# Patient Record
Sex: Female | Born: 1935 | Race: White | Hispanic: No | Smoking: Never smoker
Health system: Southern US, Community
[De-identification: ages and names within clinical notes are randomized; demographics above are authoritative.]

## PROBLEM LIST (undated history)

## (undated) DIAGNOSIS — I10 Essential (primary) hypertension: Secondary | ICD-10-CM

## (undated) DIAGNOSIS — K219 Gastro-esophageal reflux disease without esophagitis: Secondary | ICD-10-CM

## (undated) DIAGNOSIS — M810 Age-related osteoporosis without current pathological fracture: Secondary | ICD-10-CM

## (undated) DIAGNOSIS — R7302 Impaired glucose tolerance (oral): Secondary | ICD-10-CM

## (undated) DIAGNOSIS — E785 Hyperlipidemia, unspecified: Secondary | ICD-10-CM

## (undated) DIAGNOSIS — E559 Vitamin D deficiency, unspecified: Secondary | ICD-10-CM

## (undated) HISTORY — DX: Impaired glucose tolerance (oral): R73.02

## (undated) HISTORY — DX: Hyperlipidemia, unspecified: E78.5

## (undated) HISTORY — PX: CATARACT EXTRACTION: SUR2

## (undated) HISTORY — DX: Age-related osteoporosis without current pathological fracture: M81.0

## (undated) HISTORY — DX: Essential (primary) hypertension: I10

## (undated) HISTORY — DX: Gastro-esophageal reflux disease without esophagitis: K21.9

## (undated) HISTORY — DX: Vitamin D deficiency, unspecified: E55.9

---

## 2003-04-04 ENCOUNTER — Emergency Department (HOSPITAL_COMMUNITY): Admission: EM | Admit: 2003-04-04 | Discharge: 2003-04-05 | Payer: Self-pay | Admitting: Emergency Medicine

## 2005-03-09 ENCOUNTER — Ambulatory Visit: Payer: Self-pay | Admitting: Internal Medicine

## 2005-05-17 ENCOUNTER — Ambulatory Visit: Payer: Self-pay | Admitting: Internal Medicine

## 2005-11-09 ENCOUNTER — Ambulatory Visit: Payer: Self-pay | Admitting: Internal Medicine

## 2006-05-15 ENCOUNTER — Ambulatory Visit: Payer: Self-pay | Admitting: Internal Medicine

## 2006-05-15 LAB — CONVERTED CEMR LAB
AST: 23 units/L (ref 0–37)
Albumin: 4.1 g/dL (ref 3.5–5.2)
Basophils Absolute: 0 10*3/uL (ref 0.0–0.1)
CO2: 34 meq/L — ABNORMAL HIGH (ref 19–32)
Chol/HDL Ratio, serum: 2.7
Creatinine, Ser: 0.8 mg/dL (ref 0.4–1.2)
Crystals: NEGATIVE
GFR calc non Af Amer: 74 mL/min
Glucose, Bld: 101 mg/dL — ABNORMAL HIGH (ref 70–99)
Ketones, ur: NEGATIVE mg/dL
MCHC: 33.5 g/dL (ref 30.0–36.0)
Monocytes Relative: 8.4 % (ref 3.0–11.0)
Neutro Abs: 5.7 10*3/uL (ref 1.4–7.7)
Platelets: 253 10*3/uL (ref 150–400)
RBC: 4.1 M/uL (ref 3.87–5.11)
RDW: 11.4 % — ABNORMAL LOW (ref 11.5–14.6)
Sodium: 138 meq/L (ref 135–145)
Specific Gravity, Urine: 1.01 (ref 1.000–1.03)
Total Bilirubin: 1.2 mg/dL (ref 0.3–1.2)
Urine Glucose: NEGATIVE mg/dL
Urobilinogen, UA: 0.2 (ref 0.0–1.0)
pH: 7 (ref 5.0–8.0)

## 2006-05-28 ENCOUNTER — Emergency Department (HOSPITAL_COMMUNITY): Admission: EM | Admit: 2006-05-28 | Discharge: 2006-05-28 | Payer: Self-pay | Admitting: Emergency Medicine

## 2007-04-10 DIAGNOSIS — M810 Age-related osteoporosis without current pathological fracture: Secondary | ICD-10-CM

## 2007-04-10 DIAGNOSIS — K219 Gastro-esophageal reflux disease without esophagitis: Secondary | ICD-10-CM

## 2007-04-10 DIAGNOSIS — E739 Lactose intolerance, unspecified: Secondary | ICD-10-CM

## 2007-04-10 DIAGNOSIS — I10 Essential (primary) hypertension: Secondary | ICD-10-CM

## 2007-04-10 DIAGNOSIS — E7800 Pure hypercholesterolemia, unspecified: Secondary | ICD-10-CM | POA: Insufficient documentation

## 2007-04-10 HISTORY — DX: Gastro-esophageal reflux disease without esophagitis: K21.9

## 2007-04-10 HISTORY — DX: Age-related osteoporosis without current pathological fracture: M81.0

## 2007-04-10 HISTORY — DX: Essential (primary) hypertension: I10

## 2007-04-13 ENCOUNTER — Encounter: Payer: Self-pay | Admitting: Internal Medicine

## 2007-04-13 DIAGNOSIS — E785 Hyperlipidemia, unspecified: Secondary | ICD-10-CM

## 2007-04-13 HISTORY — DX: Hyperlipidemia, unspecified: E78.5

## 2007-05-18 ENCOUNTER — Ambulatory Visit: Payer: Self-pay | Admitting: Internal Medicine

## 2007-05-18 LAB — CONVERTED CEMR LAB
ALT: 23 units/L (ref 0–35)
AST: 28 units/L (ref 0–37)
Basophils Absolute: 0.1 10*3/uL (ref 0.0–0.1)
Bilirubin Urine: NEGATIVE
Bilirubin, Direct: 0.2 mg/dL (ref 0.0–0.3)
CO2: 31 meq/L (ref 19–32)
Calcium: 9.8 mg/dL (ref 8.4–10.5)
Chloride: 101 meq/L (ref 96–112)
Cholesterol: 132 mg/dL (ref 0–200)
Creatinine, Ser: 0.8 mg/dL (ref 0.4–1.2)
Eosinophils Relative: 0.8 % (ref 0.0–5.0)
Glucose, Bld: 100 mg/dL — ABNORMAL HIGH (ref 70–99)
HCT: 36.8 % (ref 36.0–46.0)
Hemoglobin: 12.9 g/dL (ref 12.0–15.0)
MCHC: 35.1 g/dL (ref 30.0–36.0)
MCV: 90.9 fL (ref 78.0–100.0)
Monocytes Absolute: 0.7 10*3/uL (ref 0.2–0.7)
Mucus, UA: NEGATIVE
Neutrophils Relative %: 61.7 % (ref 43.0–77.0)
Nitrite: POSITIVE — AB
RDW: 11.6 % (ref 11.5–14.6)
Sodium: 139 meq/L (ref 135–145)
TSH: 3.64 microintl units/mL (ref 0.35–5.50)
Total Bilirubin: 1.3 mg/dL — ABNORMAL HIGH (ref 0.3–1.2)
Total Protein, Urine: NEGATIVE mg/dL
Total Protein: 7.2 g/dL (ref 6.0–8.3)
WBC: 7.4 10*3/uL (ref 4.5–10.5)
pH: 6.5 (ref 5.0–8.0)

## 2008-06-20 ENCOUNTER — Ambulatory Visit: Payer: Self-pay | Admitting: Internal Medicine

## 2009-07-15 ENCOUNTER — Telehealth: Payer: Self-pay | Admitting: Internal Medicine

## 2009-08-26 ENCOUNTER — Ambulatory Visit: Payer: Self-pay | Admitting: Internal Medicine

## 2009-08-26 DIAGNOSIS — E559 Vitamin D deficiency, unspecified: Secondary | ICD-10-CM | POA: Insufficient documentation

## 2009-08-26 HISTORY — DX: Vitamin D deficiency, unspecified: E55.9

## 2010-02-24 ENCOUNTER — Ambulatory Visit: Payer: Self-pay | Admitting: Internal Medicine

## 2010-08-08 LAB — CONVERTED CEMR LAB
ALT: 21 units/L (ref 0–35)
AST: 25 units/L (ref 0–37)
Albumin: 4.3 g/dL (ref 3.5–5.2)
Alkaline Phosphatase: 66 units/L (ref 39–117)
BUN: 10 mg/dL (ref 6–23)
Basophils Absolute: 0 10*3/uL (ref 0.0–0.1)
Basophils Relative: 0.5 % (ref 0.0–3.0)
Basophils Relative: 0.5 % (ref 0.0–3.0)
Bilirubin, Direct: 0.2 mg/dL (ref 0.0–0.3)
Bilirubin, Direct: 0.2 mg/dL (ref 0.0–0.3)
CO2: 30 meq/L (ref 19–32)
CO2: 33 meq/L — ABNORMAL HIGH (ref 19–32)
Chloride: 100 meq/L (ref 96–112)
Chloride: 97 meq/L (ref 96–112)
Creatinine, Ser: 0.8 mg/dL (ref 0.4–1.2)
Eosinophils Absolute: 0 10*3/uL (ref 0.0–0.7)
Glucose, Bld: 100 mg/dL — ABNORMAL HIGH (ref 70–99)
Glucose, Bld: 97 mg/dL (ref 70–99)
HDL: 66.2 mg/dL (ref >39.00)
Hemoglobin: 12.6 g/dL (ref 12.0–15.0)
Ketones, ur: NEGATIVE mg/dL
Ketones, ur: NEGATIVE mg/dL
LDL Cholesterol: 71 mg/dL (ref 0–99)
Lymphocytes Relative: 21.9 % (ref 12.0–46.0)
MCHC: 33.9 g/dL (ref 30.0–36.0)
MCHC: 34.7 g/dL (ref 30.0–36.0)
MCV: 93.6 fL (ref 78.0–100.0)
Monocytes Absolute: 0.6 10*3/uL (ref 0.1–1.0)
Monocytes Relative: 6.5 % (ref 3.0–12.0)
Mucus, UA: NEGATIVE
Neutrophils Relative %: 70.3 % (ref 43.0–77.0)
Neutrophils Relative %: 74.8 % (ref 43.0–77.0)
Nitrite: POSITIVE — AB
Potassium: 3.7 meq/L (ref 3.5–5.1)
RBC: 3.91 M/uL (ref 3.87–5.11)
RBC: 3.98 M/uL (ref 3.87–5.11)
RDW: 11.3 % — ABNORMAL LOW (ref 11.5–14.6)
RDW: 11.9 % (ref 11.5–14.6)
Sodium: 138 meq/L (ref 135–145)
Specific Gravity, Urine: 1.01 (ref 1.000–1.03)
Specific Gravity, Urine: <=1.005 (ref 1.000–1.030)
Total Bilirubin: 1.2 mg/dL (ref 0.3–1.2)
Total CHOL/HDL Ratio: 2
Total CHOL/HDL Ratio: 2.4
Total Protein: 7 g/dL (ref 6.0–8.3)
Triglycerides: 70 mg/dL (ref 0–149)
Triglycerides: 71 mg/dL (ref 0.0–149.0)
Urine Glucose: NEGATIVE mg/dL
Urobilinogen, UA: 0.2 (ref 0.0–1.0)
Vit D, 1,25-Dihydroxy: 13 — ABNORMAL LOW (ref 30–89)
Vit D, 25-Hydroxy: 10 ng/mL — ABNORMAL LOW (ref 30–89)
pH: 6 (ref 5.0–8.0)
pH: 7 (ref 5.0–8.0)

## 2010-08-10 NOTE — Assessment & Plan Note (Signed)
Summary: 6 MTH FU--STC   Vital Signs:  Patient profile:   77 year old female Height:      64 inches Weight:      115.50 pounds BMI:     19.90 O2 Sat:      98 % on Room air Temp:     97.8 degrees F oral Pulse rate:   62 / minute BP sitting:   120 / 70  (left arm) Cuff size:   regular  Vitals Entered By: Zella Ball Ewing CMA Duncan Dull) (February 24, 2010 8:01 AM)  O2 Flow:  Room air  Preventive Care Screening     still declines above as before  CC: 6 month followup/RE   CC:  6 month followup/RE.  History of Present Illness: here to f/u; husband died in Jun 06, 2011after surgury for leg fracture, getting PT at home and she thinks ? aneurysm rupture; Pt denies CP, sob, doe, wheezing, orthopnea, pnd, worsening LE edema, palps, dizziness or syncope  Pt denies new neuro symptoms such as headache, facial or extremity weakness No fever, wt loss, night sweats, loss of appetite or other constitutional symptoms   No falls or injuries.  Denies significant depression, anxiety or panic.  Denies polydipsia or polyuria.  Has excellent social support.    Preventive Screening-Counseling & Management      Drug Use:  no.    Problems Prior to Update: 1)  Unspecified Vitamin D Deficiency  (ICD-268.9) 2)  Preventive Health Care  (ICD-V70.0) 3)  Preventive Health Care  (ICD-V70.0) 4)  Routine General Medical Exam@health  Care Facl  (ICD-V70.0) 5)  Hyperlipidemia  (ICD-272.4) 6)  Hypercholesterolemia  (ICD-272.0) 7)  Glucose Intolerance  (ICD-271.3) 8)  Osteoporosis  (ICD-733.00) 9)  Hypertension  (ICD-401.9) 10)  Gerd  (ICD-530.81)  Medications Prior to Update: 1)  Crestor 40 Mg Tabs (Rosuvastatin Calcium) .... Take 1 Tablet By Mouth Once A Day 2)  Diltiazem Hcl Coated Beads 240 Mg Cp24 (Diltiazem Hcl Coated Beads) .Marland Kitchen.. 1 By Mouth Once Daily 3)  Lisinopril-Hydrochlorothiazide 20-12.5 Mg Tabs (Lisinopril-Hydrochlorothiazide) .Marland Kitchen.. 1 By Mouth Once Daily 4)  Ecotrin Low Strength 81 Mg  Tbec (Aspirin) .Marland Kitchen.. 1  By Mouth Qd  Current Medications (verified): 1)  Crestor 40 Mg Tabs (Rosuvastatin Calcium) .... Take 1 Tablet By Mouth Once A Day 2)  Diltiazem Hcl Coated Beads 240 Mg Cp24 (Diltiazem Hcl Coated Beads) .Marland Kitchen.. 1 By Mouth Once Daily 3)  Lisinopril-Hydrochlorothiazide 20-12.5 Mg Tabs (Lisinopril-Hydrochlorothiazide) .Marland Kitchen.. 1 By Mouth Once Daily 4)  Ecotrin Low Strength 81 Mg  Tbec (Aspirin) .Marland Kitchen.. 1 By Mouth Qd  Allergies (verified): No Known Drug Allergies  Past History:  Past Surgical History: Last updated: 06/20/2008 Cataract extraction  Social History: Last updated: 02/24/2010 Never Smoked Alcohol use-no Married no children retire - [employer] and [employer] Drug use-no  Risk Factors: Smoking Status: never (05/18/2007)  Past Medical History: GERD Hypertension Osteoporosis Glucose Intolerance Hyperlipidemia vit d deficiency  Social History: Reviewed history from 06/20/2008 and no changes required. Never Smoked Alcohol use-no Married no children retire - [employer] and [employer] use-no Drug Use:  no  Review of Systems       all otherwise negative per pt -    Physical Exam  General:  alert and well-developed.   Head:  normocephalic and atraumatic.   Eyes:  vision grossly intact, pupils equal, and pupils round.   Ears:  R ear normal and L ear normal.   Nose:  no external deformity and no nasal discharge.  Mouth:  pharynx pink and moist and fair dentition.   Neck:  supple and no masses.   Lungs:  normal respiratory effort and normal breath sounds.   Heart:  normal rate and regular rhythm.  with gr 1-2/6 syst murmur RUSB  Abdomen:  soft, non-tender, and normal bowel sounds.   Extremities:  no edema, no erythema    Impression & Recommendations:  Problem # 1:  VITAMIN D DEFICIENCY (ICD-268.9) pt to start vit d 1000 units per day otc  Problem # 2:  OSTEOPOROSIS (ICD-733.00) d/w pt - still declines dxa at this time;  also to take supp  calcium   Problem # 3:  HYPERLIPIDEMIA (ICD-272.4)  Her updated medication list for this problem includes:    Crestor 40 Mg Tabs (Rosuvastatin calcium) .Marland Kitchen... Take 1 tablet by mouth once a day  Labs Reviewed: SGOT: 25 (08/26/2009)   SGPT: 18 (08/26/2009)   HDL:66.20 (08/26/2009), 60.8 (06/20/2008)  LDL:60 (08/26/2009), 71 (06/20/2008)  Chol:140 (08/26/2009), 146 (06/20/2008)  Trig:71.0 (08/26/2009), 70 (06/20/2008) stable overall by hx and exam, ok to continue meds/tx as is, pt declines re-check lipids today, to cont diet  Problem # 4:  HYPERTENSION (ICD-401.9)  Her updated medication list for this problem includes:    Diltiazem Hcl Coated Beads 240 Mg Cp24 (Diltiazem hcl coated beads) .Marland Kitchen... 1 by mouth once daily    Lisinopril-hydrochlorothiazide 20-12.5 Mg Tabs (Lisinopril-hydrochlorothiazide) .Marland Kitchen... 1 by mouth once daily  BP today: 120/70 Prior BP: 116/60 (08/26/2009)  Labs Reviewed: K+: 3.6 (08/26/2009) Creat: : 0.8 (08/26/2009)   Chol: 140 (08/26/2009)   HDL: 66.20 (08/26/2009)   LDL: 60 (08/26/2009)   TG: 71.0 (08/26/2009) stable overall by hx and exam, ok to continue meds/tx as is   Complete Medication List: 1)  Crestor 40 Mg Tabs (Rosuvastatin calcium) .... Take 1 tablet by mouth once a day 2)  Diltiazem Hcl Coated Beads 240 Mg Cp24 (Diltiazem hcl coated beads) .Marland Kitchen.. 1 by mouth once daily 3)  Lisinopril-hydrochlorothiazide 20-12.5 Mg Tabs (Lisinopril-hydrochlorothiazide) .Marland Kitchen.. 1 by mouth once daily 4)  Ecotrin Low Strength 81 Mg Tbec (Aspirin) .Marland Kitchen.. 1 by mouth qd  Patient Instructions: 1)  Please take Vit D 1000 units per day (OTC) 2)  Continue all previous medications as before this visit 3)  Please schedule a follow-up appointment in 6 months with [facility] Prescriptions: LISINOPRIL-HYDROCHLOROTHIAZIDE 20-12.5 MG TABS (LISINOPRIL-HYDROCHLOROTHIAZIDE) 1 by mouth once daily  #90 x 3   Entered and Authorized by:   [name] MD   Signed by:   [name] MD on 02/24/2010    Method used:   Electronically to        [facility] [phone]* (retail)       [address] [place], [place]  [address]       Ph: [phone]       Fax: [phone]   RxID:   [id number] DILTIAZEM HCL COATED BEADS 240 MG CP24 (DILTIAZEM HCL COATED BEADS) 1 by mouth once daily  #90 x 3   Entered and Authorized by:   [name] MD   Signed by:   [name] MD on 02/24/2010   Method used:   Electronically to        [facility] [phone]* (retail)       [address] [place], [place]  [address]       Ph:  [phone]       Fax: [phone]   RxID:   [id number] CRESTOR 40 MG TABS (ROSUVASTATIN CALCIUM) Take 1 tablet by mouth once a day  #90 x 3   Entered and Authorized by:   [name] MD   Signed by:   [name] MD on 02/24/2010   Method used:   Electronically to        [facility] [phone]* (retail)       [address] [place], [place]  [address]       Ph: [phone]       Fax: [phone]   RxID:   [id number]

## 2010-08-10 NOTE — Miscellaneous (Signed)
Summary: Orders Update  Clinical Lists Changes  Orders: Added new Service order of EKG w/ Interpretation (93000) - Signed 

## 2010-08-10 NOTE — Progress Notes (Signed)
  Phone Note Refill Request  on July 15, 2009 10:11 AM  Refills Requested: Medication #1:  LISINOPRIL-HYDROCHLOROTHIAZIDE 20-12.5 MG TABS 1 by mouth once daily   Dosage confirmed as above?Dosage Confirmed   Notes: [facility] [address] [place], [place] Initial call taken by: Scharlene Gloss,  July 15, 2009 10:12 AM    Prescriptions: LISINOPRIL-HYDROCHLOROTHIAZIDE 20-12.5 MG TABS (LISINOPRIL-HYDROCHLOROTHIAZIDE) 1 by mouth once daily  #30 x 0   Entered by:   Scharlene Gloss   Authorized by:   [name] MD   Signed by:   Scharlene Gloss on 07/15/2009   Method used:   Faxed to ...       [facility] [phone]* (retail)       [address] [place], [place]  [address]       Ph: [phone]       Fax: [phone]   RxID:   [id number] DILTIAZEM HCL COATED BEADS 240 MG CP24 (DILTIAZEM HCL COATED BEADS) 1 by mouth once daily  #30 x 0   Entered by:   Scharlene Gloss   Authorized by:   [name] MD   Signed by:   Scharlene Gloss on 07/15/2009   Method used:   Faxed to ...       [facility] [phone]* (retail)       [address] [place], [place]  [address]       Ph: [phone]       Fax: [phone]   RxID:   [id number]

## 2010-08-10 NOTE — Assessment & Plan Note (Signed)
Summary: YEARLY FU/ MEDICARE / LABS SAME DAY/ NWS   Vital Signs:  Patient profile:   77 year old female Height:      64 inches Weight:      122.50 pounds BMI:     21.10 O2 Sat:      97 % on Room air Temp:     97.1 degrees F oral Pulse rate:   76 / minute BP sitting:   116 / 60  (left arm) Cuff size:   regular  Vitals Entered ByZella Ball Ewing (August 26, 2009 8:39 AM)  O2 Flow:  Room air  Preventive Care Screening     again declines as before the above  CC: yearly/RE   CC:  yearly/RE.  History of Present Illness: overall doing well, no complaints; Pt denies CP, sob, doe, wheezing, orthopnea, pnd, worsening LE edema, palps, dizziness or syncope  Pt denies new neuro symptoms such as headache, facial or extremity weakness   Problems Prior to Update: 1)  Preventive Health Care  (ICD-V70.0) 2)  Preventive Health Care  (ICD-V70.0) 3)  Routine General Medical Exam@health  Care Facl  (ICD-V70.0) 4)  Hyperlipidemia  (ICD-272.4) 5)  Hypercholesterolemia  (ICD-272.0) 6)  Glucose Intolerance  (ICD-271.3) 7)  Osteoporosis  (ICD-733.00) 8)  Hypertension  (ICD-401.9) 9)  Gerd  (ICD-530.81)  Medications Prior to Update: 1)  Crestor 40 Mg Tabs (Rosuvastatin Calcium) .... Take 1 Tablet By Mouth Once A Day 2)  Diltiazem Hcl Coated Beads 240 Mg Cp24 (Diltiazem Hcl Coated Beads) .Marland Kitchen.. 1 By Mouth Once Daily 3)  Lisinopril-Hydrochlorothiazide 20-12.5 Mg Tabs (Lisinopril-Hydrochlorothiazide) .Marland Kitchen.. 1 By Mouth Once Daily 4)  Ecotrin Low Strength 81 Mg  Tbec (Aspirin) .Marland Kitchen.. 1 By Mouth Qd  Current Medications (verified): 1)  Crestor 40 Mg Tabs (Rosuvastatin Calcium) .... Take 1 Tablet By Mouth Once A Day 2)  Diltiazem Hcl Coated Beads 240 Mg Cp24 (Diltiazem Hcl Coated Beads) .Marland Kitchen.. 1 By Mouth Once Daily 3)  Lisinopril-Hydrochlorothiazide 20-12.5 Mg Tabs (Lisinopril-Hydrochlorothiazide) .Marland Kitchen.. 1 By Mouth Once Daily 4)  Ecotrin Low Strength 81 Mg  Tbec (Aspirin) .Marland Kitchen.. 1 By Mouth Qd  Allergies  (verified): No Known Drug Allergies  Past History:  Past Medical History: Last updated: 06/20/2008 GERD Hypertension Osteoporosis Glucose Intolerance Hyperlipidemia  Past Surgical History: Last updated: 06/20/2008 Cataract extraction  Family History: Last updated: 06/20/2008 Family History of Stroke M 1st degree relative heart disease  Social History: Last updated: 06/20/2008 Never Smoked Alcohol use-no Married no children retire - [employer] and [employer]  Risk Factors: Smoking Status: never (05/18/2007)  Review of Systems  The patient denies anorexia, fever, weight loss, weight gain, vision loss, decreased hearing, hoarseness, chest pain, syncope, dyspnea on exertion, peripheral edema, prolonged cough, headaches, hemoptysis, abdominal pain, melena, hematochezia, severe indigestion/heartburn, hematuria, incontinence, muscle weakness, suspicious skin lesions, transient blindness, difficulty walking, depression, unusual weight change, abnormal bleeding, enlarged lymph nodes, and angioedema.         all otherwise negative per pt -   Physical Exam  General:  alert and well-developed.   Head:  normocephalic and atraumatic.   Eyes:  vision grossly intact, pupils equal, and pupils round.   Ears:  R ear normal and L ear normal.   Nose:  no external deformity and no nasal discharge.   Mouth:  no gingival abnormalities and pharynx pink and moist.   Neck:  supple and no masses.   Lungs:  normal respiratory effort and normal breath sounds.   Heart:  normal rate and regular  rhythm.   Abdomen:  soft, non-tender, and normal bowel sounds.   Msk:  no joint tenderness and no joint swelling.   Extremities:  no edema, no erythema  Neurologic:  cranial nerves II-XII intact and strength normal in all extremities.     Impression & Recommendations:  Problem # 1:  Preventive Health Care (ICD-V70.0)  Overall doing well, age appropriate education and counseling updated and  referral for appropriate preventive services done unless declined, immunizations up to date or declined, diet counseling done if overweight, urged to quit smoking if smokes , most recent labs reviewed and current ordered if appropriate, ecg reviewed or declined (interpretation per ECG scanned in the EMR if done); information regarding Medicare Prevention requirements given if appropriate   Orders: TLB-BMP (Basic Metabolic Panel-BMET) (80048-METABOL) TLB-CBC Platelet - w/Differential (85025-CBCD) TLB-Hepatic/Liver Function Pnl (80076-HEPATIC) TLB-Lipid Panel (80061-LIPID) TLB-TSH (Thyroid Stimulating Hormone) (84443-TSH) TLB-Udip ONLY (81003-UDIP)  Problem # 2:  HYPERTENSION (ICD-401.9)  Her updated medication list for this problem includes:    Diltiazem Hcl Coated Beads 240 Mg Cp24 (Diltiazem hcl coated beads) .Marland Kitchen... 1 by mouth once daily    Lisinopril-hydrochlorothiazide 20-12.5 Mg Tabs (Lisinopril-hydrochlorothiazide) .Marland Kitchen... 1 by mouth once daily  BP today: 116/60 Prior BP: 130/74 (06/20/2008)  Labs Reviewed: K+: 3.7 (06/20/2008) Creat: : 0.7 (06/20/2008)   Chol: 146 (06/20/2008)   HDL: 60.8 (06/20/2008)   LDL: 71 (06/20/2008)   TG: 70 (06/20/2008) stable overall by hx and exam, ok to continue meds/tx as is   Complete Medication List: 1)  Crestor 40 Mg Tabs (Rosuvastatin calcium) .... Take 1 tablet by mouth once a day 2)  Diltiazem Hcl Coated Beads 240 Mg Cp24 (Diltiazem hcl coated beads) .Marland Kitchen.. 1 by mouth once daily 3)  Lisinopril-hydrochlorothiazide 20-12.5 Mg Tabs (Lisinopril-hydrochlorothiazide) .Marland Kitchen.. 1 by mouth once daily 4)  Ecotrin Low Strength 81 Mg Tbec (Aspirin) .Marland Kitchen.. 1 by mouth qd  Other Orders: T-Vitamin D (25-Hydroxy) ([address])  Patient Instructions: 1)  Continue all previous medications as before this visit  2)  Please go to the Lab in the basement for your blood and/or urine tests today  3)  Please schedule a follow-up appointment in 6 months.

## 2010-08-24 ENCOUNTER — Other Ambulatory Visit: Payer: Self-pay

## 2010-08-31 ENCOUNTER — Encounter: Payer: Self-pay | Admitting: Internal Medicine

## 2010-08-31 ENCOUNTER — Encounter (INDEPENDENT_AMBULATORY_CARE_PROVIDER_SITE_OTHER): Payer: Self-pay | Admitting: *Deleted

## 2010-08-31 ENCOUNTER — Other Ambulatory Visit: Payer: Medicare Other

## 2010-08-31 ENCOUNTER — Ambulatory Visit (INDEPENDENT_AMBULATORY_CARE_PROVIDER_SITE_OTHER): Payer: Medicare Other | Admitting: Internal Medicine

## 2010-08-31 ENCOUNTER — Other Ambulatory Visit: Payer: Self-pay | Admitting: Internal Medicine

## 2010-08-31 DIAGNOSIS — E785 Hyperlipidemia, unspecified: Secondary | ICD-10-CM

## 2010-08-31 DIAGNOSIS — Z0000 Encounter for general adult medical examination without abnormal findings: Secondary | ICD-10-CM

## 2010-08-31 DIAGNOSIS — E7800 Pure hypercholesterolemia, unspecified: Secondary | ICD-10-CM

## 2010-08-31 LAB — URINALYSIS, ROUTINE W REFLEX MICROSCOPIC
Nitrite: POSITIVE
Total Protein, Urine: NEGATIVE
Urobilinogen, UA: 0.2 (ref 0.0–1.0)

## 2010-08-31 LAB — BASIC METABOLIC PANEL
GFR: 79.91 mL/min (ref >60.00)
Potassium: 3.7 mEq/L (ref 3.5–5.1)
Sodium: 136 mEq/L (ref 135–145)

## 2010-08-31 LAB — CBC WITH DIFFERENTIAL/PLATELET
Eosinophils Relative: 0.1 % (ref 0.0–5.0)
HCT: 36.5 % (ref 36.0–46.0)
Hemoglobin: 12.6 g/dL (ref 12.0–15.0)
Lymphs Abs: 1.7 10*3/uL (ref 0.7–4.0)
Monocytes Relative: 8 % (ref 3.0–12.0)
Neutro Abs: 5.3 10*3/uL (ref 1.4–7.7)
Platelets: 240 10*3/uL (ref 150.0–400.0)
WBC: 7.7 10*3/uL (ref 4.5–10.5)

## 2010-08-31 LAB — HEPATIC FUNCTION PANEL
ALT: 16 U/L (ref 0–35)
AST: 21 U/L (ref 0–37)
Total Bilirubin: 1 mg/dL (ref 0.3–1.2)

## 2010-08-31 LAB — LIPID PANEL
Cholesterol: 142 mg/dL (ref 0–200)
LDL Cholesterol: 62 mg/dL (ref 0–99)

## 2010-08-31 LAB — TSH: TSH: 2.04 u[IU]/mL (ref 0.35–5.50)

## 2010-09-07 NOTE — Assessment & Plan Note (Signed)
Summary: 6 mos f/u # /cd   Vital Signs:  Patient profile:   77 year old female Height:      64 inches Weight:      120 pounds BMI:     20.67 O2 Sat:      97 % on Room air Temp:     97.6 degrees F oral Pulse rate:   66 / minute BP sitting:   140 / 62  (left arm) Cuff size:   regular  Vitals Entered By: Zella Ball Ewing CMA (AAMA) (August 31, 2010 8:08 AM)  O2 Flow:  Room air  Preventive Care Screening     decliens flu shot, delcines dxa and colonsocpy, mammogram  CC: 6 month ROV/RE   CC:  6 month ROV/RE.  History of Present Illness: here for wellness, overall doing ok;  Pt denies CP, worsening sob, doe, wheezing, orthopnea, pnd, worsening LE edema, palps, dizziness or syncope  Pt denies new neuro symptoms such as headache, facial or extremity weakness  Pt denies polydipsia, polyuria Overall good compliance with meds, trying to follow low chol diet, wt stable, little excercise however Overall good compliance with meds, and good tolerability.  No fever, wt loss, night sweats, loss of appetite or other constitutional symptoms  Denies worsening depressive symptoms, suicidal ideation, or panic.   Pt states good ability with ADL's, low fall risk, home safety reviewed and adequate, no significant change in hearing or vision, trying to follow lower chol diet, and very  active  with regular excercise with walking.  Problems Prior to Update: 1)  Vitamin D Deficiency  (ICD-268.9) 2)  Unspecified Vitamin D Deficiency  (ICD-268.9) 3)  Preventive Health Care  (ICD-V70.0) 4)  Preventive Health Care  (ICD-V70.0) 5)  Routine General Medical Exam@health  Care Facl  (ICD-V70.0) 6)  Hyperlipidemia  (ICD-272.4) 7)  Hypercholesterolemia  (ICD-272.0) 8)  Glucose Intolerance  (ICD-271.3) 9)  Osteoporosis  (ICD-733.00) 10)  Hypertension  (ICD-401.9) 11)  Gerd  (ICD-530.81)  Medications Prior to Update: 1)  Crestor 40 Mg Tabs (Rosuvastatin Calcium) .... Take 1 Tablet By Mouth Once A Day 2)  Diltiazem  Hcl Coated Beads 240 Mg Cp24 (Diltiazem Hcl Coated Beads) .Marland Kitchen.. 1 By Mouth Once Daily 3)  Lisinopril-Hydrochlorothiazide 20-12.5 Mg Tabs (Lisinopril-Hydrochlorothiazide) .Marland Kitchen.. 1 By Mouth Once Daily 4)  Ecotrin Low Strength 81 Mg  Tbec (Aspirin) .Marland Kitchen.. 1 By Mouth Qd  Current Medications (verified): 1)  Crestor 40 Mg Tabs (Rosuvastatin Calcium) .... Take 1 Tablet By Mouth Once A Day 2)  Diltiazem Hcl Coated Beads 240 Mg Cp24 (Diltiazem Hcl Coated Beads) .Marland Kitchen.. 1 By Mouth Once Daily 3)  Lisinopril-Hydrochlorothiazide 20-12.5 Mg Tabs (Lisinopril-Hydrochlorothiazide) .Marland Kitchen.. 1 By Mouth Once Daily 4)  Ecotrin Low Strength 81 Mg  Tbec (Aspirin) .Marland Kitchen.. 1 By Mouth Qd  Allergies (verified): No Known Drug Allergies  Past History:  Past Medical History: Last updated: 02/24/2010 GERD Hypertension Osteoporosis Glucose Intolerance Hyperlipidemia vit d deficiency  Past Surgical History: Last updated: 06/20/2008 Cataract extraction  Family History: Last updated: 06/20/2008 Family History of Stroke M 1st degree relative heart disease  Social History: Last updated: 08/31/2010 Never Smoked Alcohol use-no Married no children retire - [employer] and [employer] use-no husband died after 65yrs marriage in 12/09/2009  Risk Factors: Smoking Status: never (05/18/2007)  Social History: Never Smoked Alcohol use-no Married no children retire - [employer] and [employer] use-no husband died after 75yrs marriage in 12-09-09  Review of Systems  The patient denies anorexia, fever, vision loss, decreased  hearing, hoarseness, chest pain, syncope, dyspnea on exertion, peripheral edema, prolonged cough, headaches, hemoptysis, abdominal pain, melena, hematochezia, severe indigestion/heartburn, hematuria, muscle weakness, suspicious skin lesions, transient blindness, difficulty walking, depression, unusual weight change, abnormal bleeding, enlarged lymph nodes, and angioedema.         all  otherwise negative per pt -    Physical Exam  General:  alert and well-developed.   Head:  normocephalic and atraumatic.   Eyes:  vision grossly intact, pupils equal, and pupils round.   Ears:  R ear normal and L ear normal.   Nose:  no external deformity and no nasal discharge.   Mouth:  pharynx pink and moist and fair dentition.   Neck:  supple and no masses.   Lungs:  normal respiratory effort and normal breath sounds.   Heart:  normal rate and regular rhythm.  with gr 1-2/6 syst murmur RUSB  Abdomen:  soft, non-tender, and normal bowel sounds.   Msk:  no joint tenderness and no joint swelling.   Extremities:  no edema, no erythema  Neurologic:  cranial nerves II-XII intact and strength normal in all extremities.   Skin:  color normal and no rashes.   Psych:  normally interactive and not depressed appearing. , very bright , smiling , energetic    Impression & Recommendations:  Problem # 1:  Preventive Health Care (ICD-V70.0)  Overall doing well, age appropriate education and counseling updated, referral for preventive services and immunizations addressed, dietary counseling and smoking status adressed , most recent labs reviewed I have personally reviewed and have noted 1.The patient's medical and social history 2.Their use of alcohol, tobacco or illicit drugs 3.Their current medications and supplements 4. Functional ability including ADL's, fall risk, home safety risk, hearing & visual impairment  5.Diet and physical activities 6.Evidence for depression or mood disorders The patients weight, height, BMI  have been recorded in the chart I have made referrals, counseling and provided education to the patient based review of the above   Orders: TLB-BMP (Basic Metabolic Panel-BMET) (80048-METABOL) TLB-CBC Platelet - w/Differential (85025-CBCD) TLB-Hepatic/Liver Function Pnl (80076-HEPATIC) TLB-Lipid Panel (80061-LIPID) TLB-TSH (Thyroid Stimulating Hormone)  (84443-TSH) TLB-Udip ONLY (81003-UDIP)  Complete Medication List: 1)  Crestor 40 Mg Tabs (Rosuvastatin calcium) .... Take 1 tablet by mouth once a day 2)  Diltiazem Hcl Coated Beads 240 Mg Cp24 (Diltiazem hcl coated beads) .Marland Kitchen.. 1 by mouth once daily 3)  Lisinopril-hydrochlorothiazide 20-12.5 Mg Tabs (Lisinopril-hydrochlorothiazide) .Marland Kitchen.. 1 by mouth once daily 4)  Ecotrin Low Strength 81 Mg Tbec (Aspirin) .Marland Kitchen.. 1 by mouth qd  Other Orders: T-Vitamin D (25-Hydroxy) [id number])  Patient Instructions: 1)  Continue all previous medications as before this visit 2)  You are given refills today 3)  Please go to the Lab in the basement for your blood and/or urine tests today 4)  Please call the number on the [facility] Card for results of your testing  5)  Please schedule a follow-up appointment in 1 year, or sooner if needed Prescriptions: LISINOPRIL-HYDROCHLOROTHIAZIDE 20-12.5 MG TABS (LISINOPRIL-HYDROCHLOROTHIAZIDE) 1 by mouth once daily  #90 x 3   Entered and Authorized by:   [name] MD   Signed by:   [name] MD on 08/31/2010   Method used:   Print then Give to Patient   RxID:   [id number] DILTIAZEM HCL COATED BEADS 240 MG CP24 (DILTIAZEM HCL COATED BEADS) 1 by mouth once daily  #90 x 3   Entered and Authorized by:   [name]  MD   Signed by:   [name] MD on 08/31/2010   Method used:   Print then Give to Patient   RxID:   [id number] CRESTOR 40 MG TABS (ROSUVASTATIN CALCIUM) Take 1 tablet by mouth once a day  #90 x 3   Entered and Authorized by:   [name] MD   Signed by:   [name] MD on 08/31/2010   Method used:   Print then Give to Patient   RxID:   [id number]    Orders Added: 1)  T-Vitamin D (25-Hydroxy) [[id number]] 2)  TLB-BMP (Basic Metabolic Panel-BMET) [80048-METABOL] 3)  TLB-CBC Platelet - w/Differential [85025-CBCD] 4)  TLB-Hepatic/Liver Function Pnl [80076-HEPATIC] 5)  TLB-Lipid Panel [80061-LIPID] 6)  TLB-TSH (Thyroid  Stimulating Hormone) [84443-TSH] 7)  TLB-Udip ONLY [81003-UDIP] 8)  Est. Patient 65& > [53664]

## 2011-02-15 ENCOUNTER — Other Ambulatory Visit: Payer: Self-pay | Admitting: Internal Medicine

## 2011-02-24 ENCOUNTER — Other Ambulatory Visit: Payer: Self-pay | Admitting: Internal Medicine

## 2011-09-09 ENCOUNTER — Other Ambulatory Visit: Payer: Self-pay | Admitting: Internal Medicine

## 2011-09-17 ENCOUNTER — Encounter: Payer: Self-pay | Admitting: Internal Medicine

## 2011-09-17 DIAGNOSIS — R7302 Impaired glucose tolerance (oral): Secondary | ICD-10-CM

## 2011-09-17 DIAGNOSIS — Z0000 Encounter for general adult medical examination without abnormal findings: Secondary | ICD-10-CM | POA: Insufficient documentation

## 2011-09-17 HISTORY — DX: Impaired glucose tolerance (oral): R73.02

## 2011-09-22 ENCOUNTER — Other Ambulatory Visit (INDEPENDENT_AMBULATORY_CARE_PROVIDER_SITE_OTHER): Payer: Medicare Other

## 2011-09-22 ENCOUNTER — Ambulatory Visit (INDEPENDENT_AMBULATORY_CARE_PROVIDER_SITE_OTHER): Payer: Medicare Other | Admitting: Internal Medicine

## 2011-09-22 VITALS — BP 120/80 | HR 78 | Temp 97.7°F | Ht 64.0 in | Wt 119.0 lb

## 2011-09-22 DIAGNOSIS — Z0000 Encounter for general adult medical examination without abnormal findings: Secondary | ICD-10-CM

## 2011-09-22 DIAGNOSIS — E785 Hyperlipidemia, unspecified: Secondary | ICD-10-CM

## 2011-09-22 LAB — CBC WITH DIFFERENTIAL/PLATELET
Basophils Absolute: 0 10*3/uL (ref 0.0–0.1)
Basophils Relative: 0.4 % (ref 0.0–3.0)
Eosinophils Absolute: 0 10*3/uL (ref 0.0–0.7)
HCT: 38.4 % (ref 36.0–46.0)
Hemoglobin: 12.8 g/dL (ref 12.0–15.0)
Lymphs Abs: 1.3 10*3/uL (ref 0.7–4.0)
MCHC: 33.4 g/dL (ref 30.0–36.0)
Monocytes Relative: 7.9 % (ref 3.0–12.0)
Neutro Abs: 6 10*3/uL (ref 1.4–7.7)
RBC: 4.1 Mil/uL (ref 3.87–5.11)
RDW: 12.4 % (ref 11.5–14.6)

## 2011-09-22 LAB — URINALYSIS, ROUTINE W REFLEX MICROSCOPIC
Ketones, ur: NEGATIVE
Specific Gravity, Urine: 1.01 (ref 1.000–1.030)
Total Protein, Urine: NEGATIVE
Urine Glucose: NEGATIVE
Urobilinogen, UA: 0.2 (ref 0.0–1.0)

## 2011-09-22 LAB — BASIC METABOLIC PANEL
Calcium: 9.6 mg/dL (ref 8.4–10.5)
Creatinine, Ser: 0.9 mg/dL (ref 0.4–1.2)
GFR: 67.92 mL/min (ref >60.00)
Sodium: 138 mEq/L (ref 135–145)

## 2011-09-22 LAB — LIPID PANEL
HDL: 61.4 mg/dL (ref >39.00)
LDL Cholesterol: 64 mg/dL (ref 0–99)
Total CHOL/HDL Ratio: 2
VLDL: 13.6 mg/dL (ref 0.0–40.0)

## 2011-09-22 LAB — HEPATIC FUNCTION PANEL
Alkaline Phosphatase: 67 U/L (ref 39–117)
Bilirubin, Direct: 0.1 mg/dL (ref 0.0–0.3)
Total Bilirubin: 0.5 mg/dL (ref 0.3–1.2)

## 2011-09-22 NOTE — Patient Instructions (Signed)
Continue all other medications as before Please have the pharmacy call with any refills you may need. Please go to LAB in the Basement for the blood and/or urine tests to be done today You will be contacted by phone if any changes need to be made immediately.  Otherwise, you will receive a letter about your results with an explanation. Please return in 6 months, or sooner if needed 

## 2011-09-22 NOTE — Assessment & Plan Note (Addendum)
Overall doing well, age appropriate education and counseling updated, referrals for preventative services and immunizations addressed, dietary and smoking counseling addressed, most recent labs and ECG reviewed.  I have personally reviewed and have noted: 1) the patient's medical and social history 2) The pt's use of alcohol, tobacco, and illicit drugs 3) The patient's current medications and supplements 4) Functional ability including ADL's, fall risk, home safety risk, hearing and visual impairment 5) Diet and physical activities 6) Evidence for depression or mood disorder 7) The patient's height, weight, and BMI have been recorded in the chart I have made referrals, and provided counseling and education based on review of the above ECG reviewed as per emr, declines colonscopy

## 2011-09-25 ENCOUNTER — Encounter: Payer: Self-pay | Admitting: Internal Medicine

## 2011-09-25 NOTE — Progress Notes (Signed)
Subjective:    Patient ID: Gloria [name], female    DOB: 05/09/1927, 78 y.o.   MRN: [id number]  HPI  Here for wellness and f/u;  Overall doing ok;  Pt denies CP, worsening SOB, DOE, wheezing, orthopnea, PND, worsening LE edema, palpitations, dizziness or syncope.  Pt denies neurological change such as new Headache, facial or extremity weakness.  Pt denies polydipsia, polyuria, or low sugar symptoms. Pt states overall good compliance with treatment and medications, good tolerability, and trying to follow lower cholesterol diet.  Pt denies worsening depressive symptoms, suicidal ideation or panic. No fever, wt loss, night sweats, loss of appetite, or other constitutional symptoms.  Pt states good ability with ADL's, low fall risk, home safety reviewed and adequate, no significant changes in hearing or vision, and very active and able, no chronic pain, does several toe touches in the office to prove her spryness. Very bright and energetic for her age. Past Medical History  Diagnosis Date  . Impaired glucose tolerance 09/17/2011  . OSTEOPOROSIS 04/10/2007    Qualifier: Diagnosis of  By: Maris Berger   . HYPERTENSION 04/10/2007    Qualifier: Diagnosis of  By: Maris Berger   . HYPERLIPIDEMIA 04/13/2007    Qualifier: Diagnosis of  By: Jonny Ruiz MD, Len Blalock   . GERD 04/10/2007    Qualifier: Diagnosis of  By: Maris Berger   . Unspecified vitamin D deficiency 08/26/2009    Centricity Description: UNSPECIFIED VITAMIN D DEFICIENCY Qualifier: Diagnosis of  By: Jonny Ruiz MD, Len Blalock  Centricity Description: VITAMIN D DEFICIENCY Qualifier: Diagnosis of  By: Jonny Ruiz MD, Len Blalock    Past Surgical History  Procedure Date  . Cataract extraction     reports that she has never smoked. She does not have any smokeless tobacco history on file. She reports that she does not drink alcohol or use illicit drugs. family history includes Heart disease in her father and Stroke in her sister. No Known  Allergies Current Outpatient Prescriptions on File Prior to Visit  Medication Sig Dispense Refill  . CRESTOR 40 MG tablet TAKE 1 TABLET BY MOUTH EVERY DAY  90 tablet  2  . lisinopril-hydrochlorothiazide (PRINZIDE,ZESTORETIC) 20-12.5 MG per tablet TAKE 1 TABLET BY MOUTH EVERY DAY  30 tablet  1  . TAZTIA XT 240 MG 24 hr capsule TAKE ONE CAPSULE BY MOUTH EVERY DAY  30 capsule  1   Review of Systems Review of Systems  Constitutional: Negative for diaphoresis, activity change, appetite change and unexpected weight change.  HENT: Negative for hearing loss, ear pain, facial swelling, mouth sores and neck stiffness.   Eyes: Negative for pain, redness and visual disturbance.  Respiratory: Negative for shortness of breath and wheezing.   Cardiovascular: Negative for chest pain and palpitations.  Gastrointestinal: Negative for diarrhea, blood in stool, abdominal distention and rectal pain.  Genitourinary: Negative for hematuria, flank pain and decreased urine volume.  Musculoskeletal: Negative for myalgias and joint swelling.  Skin: Negative for color change and wound.  Neurological: Negative for syncope and numbness.  Hematological: Negative for adenopathy.  Psychiatric/Behavioral: Negative for hallucinations, self-injury, decreased concentration and agitation.      Objective:   Physical Exam BP 120/80  Pulse 78  Temp(Src) 97.7 F (36.5 C) (Oral)  Ht 5\' 4"  (1.626 m)  Wt 119 lb (53.978 kg)  BMI 20.43 kg/m2  SpO2 93% Physical Exam  VS noted Constitutional: Pt is oriented to person, place, and time. Appears well-developed and well-nourished.  HENT:  Head: Normocephalic and atraumatic.  Right Ear: External ear normal.  Left Ear: External ear normal.  Nose: Nose normal.  Mouth/Throat: Oropharynx is clear and moist.  Eyes: Conjunctivae and EOM are normal. Pupils are equal, round, and reactive to light.  Neck: Normal range of motion. Neck supple. No JVD present. No tracheal deviation  present.  Cardiovascular: Normal rate, regular rhythm, normal heart sounds and intact distal pulses.   Pulmonary/Chest: Effort normal and breath sounds normal.  Abdominal: Soft. Bowel sounds are normal. There is no tenderness.  Musculoskeletal: Normal range of motion. Exhibits no edema.  Lymphadenopathy:  Has no cervical adenopathy.  Neurological: Pt is alert and oriented to person, place, and time. Pt has normal reflexes. No cranial nerve deficit.  Skin: Skin is warm and dry. No rash noted.  Psychiatric:  Has  normal mood and affect. Behavior is normal.     Assessment & Plan:

## 2011-11-09 ENCOUNTER — Other Ambulatory Visit: Payer: Self-pay | Admitting: Internal Medicine

## 2012-03-07 ENCOUNTER — Other Ambulatory Visit: Payer: Self-pay

## 2012-03-07 MED ORDER — ROSUVASTATIN CALCIUM 40 MG PO TABS: 40.0000 mg | ORAL_TABLET | Freq: Every day | ORAL | Status: DC

## 2012-03-26 ENCOUNTER — Encounter: Payer: Self-pay | Admitting: Internal Medicine

## 2012-03-26 ENCOUNTER — Ambulatory Visit (INDEPENDENT_AMBULATORY_CARE_PROVIDER_SITE_OTHER): Payer: Medicare Other | Admitting: Internal Medicine

## 2012-03-26 VITALS — BP 108/72 | HR 62 | Temp 96.8°F | Ht 64.0 in | Wt 115.4 lb

## 2012-03-26 DIAGNOSIS — R7302 Impaired glucose tolerance (oral): Secondary | ICD-10-CM

## 2012-03-26 DIAGNOSIS — R011 Cardiac murmur, unspecified: Secondary | ICD-10-CM

## 2012-03-26 DIAGNOSIS — Z0000 Encounter for general adult medical examination without abnormal findings: Secondary | ICD-10-CM

## 2012-03-26 DIAGNOSIS — R7309 Other abnormal glucose: Secondary | ICD-10-CM

## 2012-03-26 DIAGNOSIS — E559 Vitamin D deficiency, unspecified: Secondary | ICD-10-CM

## 2012-03-26 DIAGNOSIS — I10 Essential (primary) hypertension: Secondary | ICD-10-CM

## 2012-03-26 DIAGNOSIS — E785 Hyperlipidemia, unspecified: Secondary | ICD-10-CM

## 2012-03-26 NOTE — Assessment & Plan Note (Signed)
Mild but new, for echo, ECG reviewed as per emr from mar 2013,  to f/u any worsening symptoms or concerns

## 2012-03-26 NOTE — Assessment & Plan Note (Signed)
stable overall by hx and exam, most recent data reviewed with pt, and pt to continue medical treatment as before BP Readings from Last 3 Encounters:  03/26/12 108/72  09/22/11 120/80  08/31/10 140/62

## 2012-03-26 NOTE — Assessment & Plan Note (Signed)
.  stable overall by hx and exam, most recent data reviewed with pt, and pt to continue medical treatment as before Lab Results  Component Value Date   LDLCALC 64 09/22/2011

## 2012-03-26 NOTE — Patient Instructions (Addendum)
Continue all other medications as before You will be contacted regarding the referral for: echocardiogram Please have the pharmacy call with any refills you may need. Please continue your efforts at being more active, low cholesterol diet, and weight control. Please return in 6 mo with Lab testing done 3-5 days before

## 2012-03-26 NOTE — Progress Notes (Signed)
Subjective:    Patient ID: Gloria [name], female    DOB: 1927-03-03, 78 y.o.   MRN: [id number]  HPI  Here to f/u; overall doing ok,  Pt denies chest pain, increased sob or doe, wheezing, orthopnea, PND, increased LE swelling, palpitations, dizziness or syncope.  Pt denies new neurological symptoms such as new headache, or facial or extremity weakness or numbness   Pt denies polydipsia, polyuria, or low sugar symptoms such as weakness or confusion improved with po intake.  Pt states overall good compliance with meds, trying to follow lower cholesterol diet, wt overall stable but little exercise however.   Pt denies fever, wt loss, night sweats, loss of appetite, or other constitutional symptoms.  Denies worsening depressive symptoms, suicidal ideation, or panic. Past Medical History  Diagnosis Date  . Impaired glucose tolerance 09/17/2011  . OSTEOPOROSIS 04/10/2007    Qualifier: Diagnosis of  By: Maris Berger   . HYPERTENSION 04/10/2007    Qualifier: Diagnosis of  By: Maris Berger   . HYPERLIPIDEMIA 04/13/2007    Qualifier: Diagnosis of  By: Jonny Ruiz MD, Len Blalock   . GERD 04/10/2007    Qualifier: Diagnosis of  By: Maris Berger   . Unspecified vitamin D deficiency 08/26/2009    Centricity Description: UNSPECIFIED VITAMIN D DEFICIENCY Qualifier: Diagnosis of  By: Jonny Ruiz MD, Len Blalock  Centricity Description: VITAMIN D DEFICIENCY Qualifier: Diagnosis of  By: Jonny Ruiz MD, Len Blalock    Past Surgical History  Procedure Date  . Cataract extraction     reports that she has never smoked. She does not have any smokeless tobacco history on file. She reports that she does not drink alcohol or use illicit drugs. family history includes Heart disease in her father and Stroke in her sister. No Known Allergies Current Outpatient Prescriptions on File Prior to Visit  Medication Sig Dispense Refill  . lisinopril-hydrochlorothiazide (PRINZIDE,ZESTORETIC) 20-12.5 MG per tablet TAKE 1 TABLET BY  MOUTH EVERY DAY  30 tablet  11  . rosuvastatin (CRESTOR) 40 MG tablet Take 1 tablet (40 mg total) by mouth daily.  90 tablet  2  . TAZTIA XT 240 MG 24 hr capsule TAKE ONE CAPSULE BY MOUTH EVERY DAY  30 capsule  11   Review of Systems  Constitutional: Negative for diaphoresis and unexpected weight change.  HENT: Negative for tinnitus.   Eyes: Negative for photophobia and visual disturbance.  Respiratory: Negative for choking and stridor.   Gastrointestinal: Negative for vomiting and blood in stool.  Genitourinary: Negative for hematuria and decreased urine volume.  Musculoskeletal: Negative for gait problem.  Skin: Negative for color change and wound.  Neurological: Negative for tremors and numbness.  Psychiatric/Behavioral: Negative for decreased concentration. The patient is not hyperactive.      Objective:   Physical Exam BP 108/72  Pulse 62  Temp 96.8 F (36 C) (Oral)  Ht 5\' 4"  (1.626 m)  Wt 115 lb 6 oz (52.334 kg)  BMI 19.80 kg/m2  SpO2 95% Physical Exam  VS noted Constitutional: Pt appears well-developed and well-nourished.  HENT: Head: Normocephalic.  Right Ear: External ear normal.  Left Ear: External ear normal.  Eyes: Conjunctivae and EOM are normal. Pupils are equal, round, and reactive to light.  Neck: Normal range of motion. Neck supple.  Cardiovascular: Normal rate and regular rhythm. With gr 2/6 Syst murmur at LUSB   Pulmonary/Chest: Effort normal and breath sounds normal.  Abd:  Soft, NT, non-distended, + BS Neurological: Pt is alert. Not  confused  Skin: Skin is warm. No erythema.  Psychiatric: Pt behavior is normal. Thought content normal. very lively and bright today    Assessment & Plan:

## 2012-03-26 NOTE — Assessment & Plan Note (Signed)
stable overall by hx and exam, most recent data reviewed with pt, and pt to continue medical treatment as before Lab Results  Component Value Date   WBC 8.0 09/22/2011   HGB 12.8 09/22/2011   HCT 38.4 09/22/2011   PLT 239.0 09/22/2011   GLUCOSE 103* 09/22/2011   CHOL 139 09/22/2011   TRIG 68.0 09/22/2011   HDL 61.40 09/22/2011   LDLCALC 64 09/22/2011   ALT 18 09/22/2011   AST 25 09/22/2011   NA 138 09/22/2011   K 3.4* 09/22/2011   CL 98 09/22/2011   CREATININE 0.9 09/22/2011   BUN 10 09/22/2011   CO2 32 09/22/2011   TSH 1.88 09/22/2011

## 2012-03-30 ENCOUNTER — Other Ambulatory Visit (HOSPITAL_COMMUNITY): Payer: Medicare Other

## 2012-09-24 ENCOUNTER — Ambulatory Visit (INDEPENDENT_AMBULATORY_CARE_PROVIDER_SITE_OTHER): Payer: Medicare Other | Admitting: Internal Medicine

## 2012-09-24 ENCOUNTER — Encounter: Payer: Self-pay | Admitting: Internal Medicine

## 2012-09-24 ENCOUNTER — Ambulatory Visit: Payer: Medicare Other

## 2012-09-24 ENCOUNTER — Other Ambulatory Visit (INDEPENDENT_AMBULATORY_CARE_PROVIDER_SITE_OTHER): Payer: Medicare Other

## 2012-09-24 VITALS — BP 132/80 | HR 66 | Temp 97.0°F | Wt 125.0 lb

## 2012-09-24 DIAGNOSIS — Z0000 Encounter for general adult medical examination without abnormal findings: Secondary | ICD-10-CM

## 2012-09-24 DIAGNOSIS — R7309 Other abnormal glucose: Secondary | ICD-10-CM

## 2012-09-24 DIAGNOSIS — E785 Hyperlipidemia, unspecified: Secondary | ICD-10-CM

## 2012-09-24 DIAGNOSIS — E559 Vitamin D deficiency, unspecified: Secondary | ICD-10-CM

## 2012-09-24 DIAGNOSIS — I10 Essential (primary) hypertension: Secondary | ICD-10-CM

## 2012-09-24 LAB — HEMOGLOBIN A1C: Hgb A1c MFr Bld: 5.6 % (ref 4.6–6.5)

## 2012-09-24 LAB — CBC WITH DIFFERENTIAL/PLATELET
Basophils Relative: 0.4 % (ref 0.0–3.0)
Eosinophils Absolute: 0 10*3/uL (ref 0.0–0.7)
Eosinophils Relative: 0.2 % (ref 0.0–5.0)
Hemoglobin: 13.3 g/dL (ref 12.0–15.0)
Lymphocytes Relative: 22.2 % (ref 12.0–46.0)
MCHC: 34.7 g/dL (ref 30.0–36.0)
Neutro Abs: 4.9 10*3/uL (ref 1.4–7.7)
RBC: 4.22 Mil/uL (ref 3.87–5.11)
WBC: 7 10*3/uL (ref 4.5–10.5)

## 2012-09-24 LAB — URINALYSIS, ROUTINE W REFLEX MICROSCOPIC
Bilirubin Urine: NEGATIVE
Ketones, ur: NEGATIVE
Leukocytes, UA: NEGATIVE
Specific Gravity, Urine: 1.01 (ref 1.000–1.030)
Urobilinogen, UA: 0.2 (ref 0.0–1.0)

## 2012-09-24 LAB — HEPATIC FUNCTION PANEL
Bilirubin, Direct: 0.2 mg/dL (ref 0.0–0.3)
Total Bilirubin: 1.2 mg/dL (ref 0.3–1.2)
Total Protein: 7.9 g/dL (ref 6.0–8.3)

## 2012-09-24 LAB — LIPID PANEL
HDL: 69.1 mg/dL (ref >39.00)
LDL Cholesterol: 61 mg/dL (ref 0–99)
Total CHOL/HDL Ratio: 2
Triglycerides: 55 mg/dL (ref 0.0–149.0)

## 2012-09-24 LAB — BASIC METABOLIC PANEL
BUN: 11 mg/dL (ref 6–23)
CO2: 30 mEq/L (ref 19–32)
Calcium: 9.6 mg/dL (ref 8.4–10.5)
Creatinine, Ser: 0.7 mg/dL (ref 0.4–1.2)

## 2012-09-24 MED ORDER — LISINOPRIL-HYDROCHLOROTHIAZIDE 20-12.5 MG PO TABS: ORAL_TABLET | ORAL | Status: DC

## 2012-09-24 MED ORDER — ASPIRIN 81 MG PO TBEC: 81.0000 mg | DELAYED_RELEASE_TABLET | Freq: Every day | ORAL | Status: DC

## 2012-09-24 MED ORDER — DILTIAZEM HCL ER BEADS 240 MG PO CP24: ORAL_CAPSULE | ORAL | Status: DC

## 2012-09-24 MED ORDER — ROSUVASTATIN CALCIUM 40 MG PO TABS: 40.0000 mg | ORAL_TABLET | Freq: Every day | ORAL | Status: DC

## 2012-09-24 NOTE — Patient Instructions (Addendum)
Please continue all other medications as before, and refills have been done if requested. You are otherwise up to date with prevention measures today. Please continue your efforts at being more active, low cholesterol diet, and weight control. Please go to the LAB in the Basement (turn left off the elevator) for the tests to be done today You will be contacted by phone if any changes need to be made immediately.  Otherwise, you will receive a letter about your results with an explanation Please remember to sign up for My Chart if you have not done so, as this will be important to you in the future with finding out test results, communicating by private email, and scheduling acute appointments online when needed.

## 2012-09-24 NOTE — Assessment & Plan Note (Signed)

## 2012-09-24 NOTE — Progress Notes (Signed)
Subjective:    Patient ID: Gloria [name], female    DOB: 1927-05-19, 79 y.o.   MRN: [id number]  HPI  Here for wellness and f/u;  Overall doing ok;  Pt denies CP, worsening SOB, DOE, wheezing, orthopnea, PND, worsening LE edema, palpitations, dizziness or syncope.  Pt denies neurological change such as new headache, facial or extremity weakness.  Pt denies polydipsia, polyuria, or low sugar symptoms. Pt states overall good compliance with treatment and medications, good tolerability, and has been trying to follow lower cholesterol diet.  Pt denies worsening depressive symptoms, suicidal ideation or panic. No fever, night sweats, wt loss, loss of appetite, or other constitutional symptoms.  Pt states good ability with ADL's, has low fall risk, home safety reviewed and adequate, no other significant changes in hearing or vision, and quite active with exercise for her age Past Medical History  Diagnosis Date  . Impaired glucose tolerance 09/17/2011  . OSTEOPOROSIS 04/10/2007    Qualifier: Diagnosis of  By: Maris Berger   . HYPERTENSION 04/10/2007    Qualifier: Diagnosis of  By: Maris Berger   . HYPERLIPIDEMIA 04/13/2007    Qualifier: Diagnosis of  By: Jonny Ruiz MD, Len Blalock   . GERD 04/10/2007    Qualifier: Diagnosis of  By: Maris Berger   . Unspecified vitamin D deficiency 08/26/2009    Centricity Description: UNSPECIFIED VITAMIN D DEFICIENCY Qualifier: Diagnosis of  By: Jonny Ruiz MD, Len Blalock  Centricity Description: VITAMIN D DEFICIENCY Qualifier: Diagnosis of  By: Jonny Ruiz MD, Len Blalock    Past Surgical History  Procedure Laterality Date  . Cataract extraction      reports that she has never smoked. She does not have any smokeless tobacco history on file. She reports that she does not drink alcohol or use illicit drugs. family history includes Heart disease in her father and Stroke in her sister. No Known Allergies No current outpatient prescriptions on file prior to visit.    No current facility-administered medications on file prior to visit.    Review of Systems Constitutional: Negative for diaphoresis, activity change, appetite change or unexpected weight change.  HENT: Negative for hearing loss, ear pain, facial swelling, mouth sores and neck stiffness.   Eyes: Negative for pain, redness and visual disturbance.  Respiratory: Negative for shortness of breath and wheezing.   Cardiovascular: Negative for chest pain and palpitations.  Gastrointestinal: Negative for diarrhea, blood in stool, abdominal distention or other pain Genitourinary: Negative for hematuria, flank pain or change in urine volume.  Musculoskeletal: Negative for myalgias and joint swelling.  Skin: Negative for color change and wound.  Neurological: Negative for syncope and numbness. other than noted Hematological: Negative for adenopathy.  Psychiatric/Behavioral: Negative for hallucinations, self-injury, decreased concentration and agitation.      Objective:   Physical Exam BP 132/80  Pulse 66  Temp(Src) 97 F (36.1 C) (Oral)  Wt 125 lb (56.7 kg)  BMI 21.45 kg/m2  SpO2 96% VS noted,  Constitutional: Pt is oriented to person, place, and time. Appears well-developed and well-nourished.  Head: Normocephalic and atraumatic.  Right Ear: External ear normal.  Left Ear: External ear normal.  Nose: Nose normal.  Mouth/Throat: Oropharynx is clear and moist.  Eyes: Conjunctivae and EOM are normal. Pupils are equal, round, and reactive to light.  Neck: Normal range of motion. Neck supple. No JVD present. No tracheal deviation present.  Cardiovascular: Normal rate, regular rhythm, normal heart sounds and intact distal pulses.   Pulmonary/Chest: Effort normal  and breath sounds normal.  Abdominal: Soft. Bowel sounds are normal. There is no tenderness. No HSM  Musculoskeletal: Normal range of motion. Exhibits no edema.  Lymphadenopathy:  Has no cervical adenopathy.  Neurological: Pt is  alert and oriented to person, place, and time. Pt has normal reflexes. No cranial nerve deficit.  Skin: Skin is warm and dry. No rash noted.  Psychiatric:  Has  normal mood and affect. Behavior is normal. Except mild nervous     Assessment & Plan:

## 2012-11-07 ENCOUNTER — Other Ambulatory Visit: Payer: Self-pay | Admitting: Internal Medicine

## 2013-09-25 ENCOUNTER — Other Ambulatory Visit: Payer: Self-pay | Admitting: Internal Medicine

## 2013-11-01 ENCOUNTER — Encounter: Payer: Self-pay | Admitting: Internal Medicine

## 2013-11-01 ENCOUNTER — Other Ambulatory Visit (INDEPENDENT_AMBULATORY_CARE_PROVIDER_SITE_OTHER): Payer: Medicare Other

## 2013-11-01 ENCOUNTER — Ambulatory Visit (INDEPENDENT_AMBULATORY_CARE_PROVIDER_SITE_OTHER): Payer: Medicare Other | Admitting: Internal Medicine

## 2013-11-01 VITALS — BP 136/80 | HR 74 | Temp 97.9°F | Ht 64.0 in | Wt 122.0 lb

## 2013-11-01 DIAGNOSIS — Z0000 Encounter for general adult medical examination without abnormal findings: Secondary | ICD-10-CM

## 2013-11-01 DIAGNOSIS — R7309 Other abnormal glucose: Secondary | ICD-10-CM

## 2013-11-01 DIAGNOSIS — E785 Hyperlipidemia, unspecified: Secondary | ICD-10-CM

## 2013-11-01 DIAGNOSIS — R7302 Impaired glucose tolerance (oral): Secondary | ICD-10-CM

## 2013-11-01 DIAGNOSIS — Z23 Encounter for immunization: Secondary | ICD-10-CM

## 2013-11-01 DIAGNOSIS — I10 Essential (primary) hypertension: Secondary | ICD-10-CM

## 2013-11-01 LAB — CBC WITH DIFFERENTIAL/PLATELET
Basophils Absolute: 0.1 10*3/uL (ref 0.0–0.1)
Basophils Relative: 0.7 % (ref 0.0–3.0)
EOS ABS: 0 10*3/uL (ref 0.0–0.7)
Eosinophils Relative: 0.5 % (ref 0.0–5.0)
HCT: 39 % (ref 36.0–46.0)
Hemoglobin: 13.4 g/dL (ref 12.0–15.0)
Lymphocytes Relative: 25.1 % (ref 12.0–46.0)
Lymphs Abs: 1.9 10*3/uL (ref 0.7–4.0)
MCHC: 34.3 g/dL (ref 30.0–36.0)
MCV: 91.8 fl (ref 78.0–100.0)
MONO ABS: 0.6 10*3/uL (ref 0.1–1.0)
Monocytes Relative: 7.3 % (ref 3.0–12.0)
NEUTROS ABS: 5 10*3/uL (ref 1.4–7.7)
NEUTROS PCT: 66.4 % (ref 43.0–77.0)
Platelets: 261 10*3/uL (ref 150.0–400.0)
RBC: 4.25 Mil/uL (ref 3.87–5.11)
RDW: 12 % (ref 11.5–14.6)
WBC: 7.5 10*3/uL (ref 4.5–10.5)

## 2013-11-01 LAB — URINALYSIS, ROUTINE W REFLEX MICROSCOPIC
BILIRUBIN URINE: NEGATIVE
Ketones, ur: NEGATIVE
Leukocytes, UA: NEGATIVE
Nitrite: NEGATIVE
Specific Gravity, Urine: <=1.005 — AB (ref 1.000–1.030)
TOTAL PROTEIN, URINE-UPE24: NEGATIVE
URINE GLUCOSE: NEGATIVE
Urobilinogen, UA: 0.2 (ref 0.0–1.0)
pH: 6.5 (ref 5.0–8.0)

## 2013-11-01 LAB — LIPID PANEL
CHOL/HDL RATIO: 2
Cholesterol: 127 mg/dL (ref 0–200)
HDL: 59.9 mg/dL (ref >39.00)
LDL Cholesterol: 56 mg/dL (ref 0–99)
Triglycerides: 58 mg/dL (ref 0.0–149.0)
VLDL: 11.6 mg/dL (ref 0.0–40.0)

## 2013-11-01 LAB — HEPATIC FUNCTION PANEL
ALK PHOS: 69 U/L (ref 39–117)
ALT: 21 U/L (ref 0–35)
AST: 27 U/L (ref 0–37)
Albumin: 4.6 g/dL (ref 3.5–5.2)
BILIRUBIN DIRECT: 0.2 mg/dL (ref 0.0–0.3)
Total Bilirubin: 1.5 mg/dL — ABNORMAL HIGH (ref 0.3–1.2)
Total Protein: 7.4 g/dL (ref 6.0–8.3)

## 2013-11-01 LAB — BASIC METABOLIC PANEL
BUN: 11 mg/dL (ref 6–23)
CO2: 28 mEq/L (ref 19–32)
Calcium: 10.1 mg/dL (ref 8.4–10.5)
Chloride: 96 mEq/L (ref 96–112)
Creatinine, Ser: 0.8 mg/dL (ref 0.4–1.2)
GFR: 74.62 mL/min (ref >60.00)
Glucose, Bld: 103 mg/dL — ABNORMAL HIGH (ref 70–99)
Potassium: 4 mEq/L (ref 3.5–5.1)
Sodium: 135 mEq/L (ref 135–145)

## 2013-11-01 LAB — HEMOGLOBIN A1C: Hgb A1c MFr Bld: 5.4 % (ref 4.6–6.5)

## 2013-11-01 LAB — TSH: TSH: 2.51 u[IU]/mL (ref 0.35–5.50)

## 2013-11-01 MED ORDER — DILTIAZEM HCL ER BEADS 240 MG PO CP24: ORAL_CAPSULE | ORAL | Status: DC

## 2013-11-01 MED ORDER — ROSUVASTATIN CALCIUM 40 MG PO TABS: ORAL_TABLET | ORAL | Status: DC

## 2013-11-01 MED ORDER — LISINOPRIL-HYDROCHLOROTHIAZIDE 20-12.5 MG PO TABS: ORAL_TABLET | ORAL | Status: DC

## 2013-11-01 NOTE — Progress Notes (Signed)
Pre visit review using our clinic review tool, if applicable. No additional management support is needed unless otherwise documented below in the visit note. 

## 2013-11-01 NOTE — Patient Instructions (Signed)
You had the new Prevnar pneumonia shot  Please take Vit D 1000 units per day  Please continue all other medications as before, and refills have been done if requested. Please have the pharmacy call with any other refills you may need.  Please continue your efforts at being more active, low cholesterol diet, and weight control.  You are otherwise up to date with prevention measures today.  Please go to the LAB in the Basement (turn left off the elevator) for the tests to be done today  You will be contacted by phone if any changes need to be made immediately.  Otherwise, you will receive a letter about your results with an explanation, but please check with MyChart first.  Please remember to sign up for MyChart if you have not done so, as this will be important to you in the future with finding out test results, communicating by private email, and scheduling acute appointments online when needed.  Please return in 1 year for your yearly visit, or sooner if needed

## 2013-11-01 NOTE — Addendum Note (Signed)
Addended by: Marlene LardMILLER, Trulee Hamstra M on: 11/01/2013 10:41 AM   Modules accepted: Orders

## 2013-11-01 NOTE — Progress Notes (Signed)
Subjective:    Patient ID: Gloria [name], female    DOB: 12/07/1926, 80 y.o.   MRN: [id number]  HPI  Here for wellness and f/u;  Overall doing ok;  Pt denies CP, worsening SOB, DOE, wheezing, orthopnea, PND, worsening LE edema, palpitations, dizziness or syncope.  Pt denies neurological change such as new headache, facial or extremity weakness.  Pt denies polydipsia, polyuria, or low sugar symptoms. Pt states overall good compliance with treatment and medications, good tolerability, and has been trying to follow lower cholesterol diet.  Pt denies worsening depressive symptoms, suicidal ideation or panic. No fever, night sweats, wt loss, loss of appetite, or other constitutional symptoms.  Pt states good ability with ADL's, has low fall risk, home safety reviewed and adequate, no other significant changes in hearing or vision, and very active with exercise for age.  No current complaints Past Medical History  Diagnosis Date  . Impaired glucose tolerance 09/17/2011  . OSTEOPOROSIS 04/10/2007    Qualifier: Diagnosis of  By: Maris BergerSherwood, Elizabeth Ann   . HYPERTENSION 04/10/2007    Qualifier: Diagnosis of  By: Maris BergerSherwood, Elizabeth Ann   . HYPERLIPIDEMIA 04/13/2007    Qualifier: Diagnosis of  By: Jonny RuizJohn MD, Len BlalockJames W   . GERD 04/10/2007    Qualifier: Diagnosis of  By: Maris BergerSherwood, Elizabeth Ann   . Unspecified vitamin D deficiency 08/26/2009    Centricity Description: UNSPECIFIED VITAMIN D DEFICIENCY Qualifier: Diagnosis of  By: Jonny RuizJohn MD, Len BlalockJames W  Centricity Description: VITAMIN D DEFICIENCY Qualifier: Diagnosis of  By: Jonny RuizJohn MD, Len BlalockJames W    Past Surgical History  Procedure Laterality Date  . Cataract extraction      reports that she has never smoked. She does not have any smokeless tobacco history on file. She reports that she does not drink alcohol or use illicit drugs. family history includes Heart disease in her father; Stroke in her sister. No Known Allergies Current Outpatient Prescriptions on File Prior  to Visit  Medication Sig Dispense Refill  . aspirin 81 MG EC tablet Take 1 tablet (81 mg total) by mouth daily. Swallow whole.  30 tablet  12   No current facility-administered medications on file prior to visit.   Review of Systems Constitutional: Negative for increased diaphoresis, other activity, appetite or other siginficant weight change  HENT: Negative for worsening hearing loss, ear pain, facial swelling, mouth sores and neck stiffness.   Eyes: Negative for other worsening pain, redness or visual disturbance.  Respiratory: Negative for shortness of breath and wheezing.   Cardiovascular: Negative for chest pain and palpitations.  Gastrointestinal: Negative for diarrhea, blood in stool, abdominal distention or other pain Genitourinary: Negative for hematuria, flank pain or change in urine volume.  Musculoskeletal: Negative for myalgias or other joint complaints.  Skin: Negative for color change and wound.  Neurological: Negative for syncope and numbness. other than noted Hematological: Negative for adenopathy. or other swelling Psychiatric/Behavioral: Negative for hallucinations, self-injury, decreased concentration or other worsening agitation.      Objective:   Physical Exam BP 136/80  Pulse 74  Temp(Src) 97.9 F (36.6 C) (Oral)  Ht 5\' 4"  (1.626 m)  Wt 122 lb (55.339 kg)  BMI 20.93 kg/m2  SpO2 96% VS noted,  Constitutional: Pt is oriented to person, place, and time. Appears well-developed and well-nourished.  Head: Normocephalic and atraumatic.  Right Ear: External ear normal.  Left Ear: External ear normal.  Nose: Nose normal.  Mouth/Throat: Oropharynx is clear and moist.  Eyes: Conjunctivae and  EOM are normal. Pupils are equal, round, and reactive to light.  Neck: Normal range of motion. Neck supple. No JVD present. No tracheal deviation present.  Cardiovascular: Normal rate, regular rhythm, normal heart sounds and intact distal pulses.   Pulmonary/Chest: Effort  normal and breath sounds without rales or wheezing  Abdominal: Soft. Bowel sounds are normal. NT. No HSM  Musculoskeletal: Normal range of motion. Exhibits no edema.  Lymphadenopathy:  Has no cervical adenopathy.  Neurological: Pt is alert and oriented to person, place, and time. Pt has normal reflexes. No cranial nerve deficit. Motor grossly intact Skin: Skin is warm and dry. No rash noted.  Psychiatric:  Has normal mood and affect. Behavior is normal.     Assessment & Plan:

## 2013-11-01 NOTE — Assessment & Plan Note (Signed)

## 2013-11-01 NOTE — Assessment & Plan Note (Signed)
stable overall by history and exam, recent data reviewed with pt, and pt to continue medical treatment as before,  to f/u any worsening symptoms or concerns Lab Results  Component Value Date   HGBA1C 5.6 09/24/2012

## 2013-11-06 ENCOUNTER — Encounter: Payer: Self-pay | Admitting: Internal Medicine

## 2014-10-31 ENCOUNTER — Other Ambulatory Visit: Payer: Self-pay | Admitting: Internal Medicine

## 2014-11-04 ENCOUNTER — Other Ambulatory Visit (INDEPENDENT_AMBULATORY_CARE_PROVIDER_SITE_OTHER): Payer: Medicare Other

## 2014-11-04 ENCOUNTER — Encounter: Payer: Self-pay | Admitting: Internal Medicine

## 2014-11-04 ENCOUNTER — Ambulatory Visit (INDEPENDENT_AMBULATORY_CARE_PROVIDER_SITE_OTHER): Payer: Medicare Other | Admitting: Internal Medicine

## 2014-11-04 VITALS — BP 124/80 | HR 69 | Temp 98.0°F | Resp 16 | Ht 64.0 in | Wt 120.0 lb

## 2014-11-04 DIAGNOSIS — R7302 Impaired glucose tolerance (oral): Secondary | ICD-10-CM

## 2014-11-04 DIAGNOSIS — Z0000 Encounter for general adult medical examination without abnormal findings: Secondary | ICD-10-CM

## 2014-11-04 LAB — LIPID PANEL
CHOLESTEROL: 133 mg/dL (ref 0–200)
HDL: 55.4 mg/dL (ref >39.00)
LDL Cholesterol: 63 mg/dL (ref 0–99)
NONHDL: 77.6
Total CHOL/HDL Ratio: 2
Triglycerides: 71 mg/dL (ref 0.0–149.0)
VLDL: 14.2 mg/dL (ref 0.0–40.0)

## 2014-11-04 LAB — CBC WITH DIFFERENTIAL/PLATELET
BASOS ABS: 0.1 10*3/uL (ref 0.0–0.1)
Basophils Relative: 1.1 % (ref 0.0–3.0)
Eosinophils Absolute: 0 10*3/uL (ref 0.0–0.7)
Eosinophils Relative: 0.1 % (ref 0.0–5.0)
HCT: 37.2 % (ref 36.0–46.0)
Hemoglobin: 13 g/dL (ref 12.0–15.0)
LYMPHS ABS: 1.7 10*3/uL (ref 0.7–4.0)
LYMPHS PCT: 19.9 % (ref 12.0–46.0)
MCHC: 35 g/dL (ref 30.0–36.0)
MCV: 90.5 fl (ref 78.0–100.0)
MONO ABS: 0.6 10*3/uL (ref 0.1–1.0)
Monocytes Relative: 6.6 % (ref 3.0–12.0)
NEUTROS PCT: 72.3 % (ref 43.0–77.0)
Neutro Abs: 6.3 10*3/uL (ref 1.4–7.7)
Platelets: 236 10*3/uL (ref 150.0–400.0)
RBC: 4.11 Mil/uL (ref 3.87–5.11)
RDW: 12.5 % (ref 11.5–15.5)
WBC: 8.6 10*3/uL (ref 4.0–10.5)

## 2014-11-04 LAB — URINALYSIS, ROUTINE W REFLEX MICROSCOPIC
BILIRUBIN URINE: NEGATIVE
KETONES UR: NEGATIVE
LEUKOCYTES UA: NEGATIVE
Nitrite: POSITIVE — AB
Specific Gravity, Urine: 1.01 (ref 1.000–1.030)
TOTAL PROTEIN, URINE-UPE24: NEGATIVE
URINE GLUCOSE: NEGATIVE
UROBILINOGEN UA: 0.2 (ref 0.0–1.0)
pH: 7 (ref 5.0–8.0)

## 2014-11-04 LAB — HEPATIC FUNCTION PANEL
ALK PHOS: 80 U/L (ref 39–117)
ALT: 20 U/L (ref 0–35)
AST: 25 U/L (ref 0–37)
Albumin: 4.5 g/dL (ref 3.5–5.2)
BILIRUBIN DIRECT: 0.2 mg/dL (ref 0.0–0.3)
BILIRUBIN TOTAL: 1 mg/dL (ref 0.2–1.2)
Total Protein: 7.1 g/dL (ref 6.0–8.3)

## 2014-11-04 LAB — BASIC METABOLIC PANEL
BUN: 13 mg/dL (ref 6–23)
CO2: 31 meq/L (ref 19–32)
Calcium: 9.9 mg/dL (ref 8.4–10.5)
Chloride: 97 mEq/L (ref 96–112)
Creatinine, Ser: 0.74 mg/dL (ref 0.40–1.20)
GFR: 79.11 mL/min (ref >60.00)
Glucose, Bld: 98 mg/dL (ref 70–99)
POTASSIUM: 3.7 meq/L (ref 3.5–5.1)
SODIUM: 135 meq/L (ref 135–145)

## 2014-11-04 LAB — TSH: TSH: 5.07 u[IU]/mL — ABNORMAL HIGH (ref 0.35–4.50)

## 2014-11-04 LAB — HEMOGLOBIN A1C: Hgb A1c MFr Bld: 5.5 % (ref 4.6–6.5)

## 2014-11-04 MED ORDER — LISINOPRIL-HYDROCHLOROTHIAZIDE 20-12.5 MG PO TABS: 1.0000 | ORAL_TABLET | Freq: Every day | ORAL | Status: DC

## 2014-11-04 MED ORDER — DILTIAZEM HCL ER COATED BEADS 240 MG PO CP24: 240.0000 mg | ORAL_CAPSULE | Freq: Every day | ORAL | Status: DC

## 2014-11-04 MED ORDER — ROSUVASTATIN CALCIUM 40 MG PO TABS: ORAL_TABLET | ORAL | Status: DC

## 2014-11-04 NOTE — Progress Notes (Signed)
Subjective:    Patient ID: Gloria [name], female    DOB: 12-13-1926, 81 y.o.   MRN: [id number]  HPI  Here for wellness and f/u;  Overall doing ok;  Pt denies Chest pain, worsening SOB, DOE, wheezing, orthopnea, PND, worsening LE edema, palpitations, dizziness or syncope.  Pt denies neurological change such as new headache, facial or extremity weakness.  Pt denies polydipsia, polyuria, or low sugar symptoms. Pt states overall good compliance with treatment and medications, good tolerability, and has been trying to follow appropriate diet.  Pt denies worsening depressive symptoms, suicidal ideation or panic. No fever, night sweats, wt loss, loss of appetite, or other constitutional symptoms.  Pt states good ability with ADL's, has low fall risk, home safety reviewed and adequate, no other significant changes in hearing or vision, and very active for age with exercise with walking in a country field and gardening.  No complaints.   Past Medical History  Diagnosis Date  . Impaired glucose tolerance 09/17/2011  . OSTEOPOROSIS 04/10/2007    Qualifier: Diagnosis of  By: Maris BergerSherwood, Elizabeth Ann   . HYPERTENSION 04/10/2007    Qualifier: Diagnosis of  By: Maris BergerSherwood, Elizabeth Ann   . HYPERLIPIDEMIA 04/13/2007    Qualifier: Diagnosis of  By: Jonny RuizJohn MD, Len BlalockJames W   . GERD 04/10/2007    Qualifier: Diagnosis of  By: Maris BergerSherwood, Elizabeth Ann   . Unspecified vitamin D deficiency 08/26/2009    Centricity Description: UNSPECIFIED VITAMIN D DEFICIENCY Qualifier: Diagnosis of  By: Jonny RuizJohn MD, Len BlalockJames W  Centricity Description: VITAMIN D DEFICIENCY Qualifier: Diagnosis of  By: Jonny RuizJohn MD, Len BlalockJames W    Past Surgical History  Procedure Laterality Date  . Cataract extraction      reports that she has never smoked. She does not have any smokeless tobacco history on file. She reports that she does not drink alcohol or use illicit drugs. family history includes Heart disease in her father; Stroke in her sister. No Known  Allergies Current Outpatient Prescriptions on File Prior to Visit  Medication Sig Dispense Refill  . aspirin 81 MG EC tablet Take 1 tablet (81 mg total) by mouth daily. Swallow whole. 30 tablet 12   No current facility-administered medications on file prior to visit.     Review of Systems Constitutional: Negative for increased diaphoresis, other activity, appetite or siginficant weight change other than noted HENT: Negative for worsening hearing loss, ear pain, facial swelling, mouth sores and neck stiffness.   Eyes: Negative for other worsening pain, redness or visual disturbance.  Respiratory: Negative for shortness of breath and wheezing  Cardiovascular: Negative for chest pain and palpitations.  Gastrointestinal: Negative for diarrhea, blood in stool, abdominal distention or other pain Genitourinary: Negative for hematuria, flank pain or change in urine volume.  Musculoskeletal: Negative for myalgias or other joint complaints.  Skin: Negative for color change and wound or drainage.  Neurological: Negative for syncope and numbness. other than noted Hematological: Negative for adenopathy. or other swelling Psychiatric/Behavioral: Negative for hallucinations, SI, self-injury, decreased concentration or other worsening agitation.      Objective:   Physical Exam BP 124/80 mmHg  Pulse 69  Temp(Src) 98 F (36.7 C) (Oral)  Resp 16  Ht 5\' 4"  (1.626 m)  Wt 120 lb (54.432 kg)  BMI 20.59 kg/m2  SpO2 96% VS noted,  Constitutional: Pt is oriented to person, place, and time. Appears well-developed and well-nourished, in no significant distress Head: Normocephalic and atraumatic.  Right Ear: External ear normal.  Left  Ear: External ear normal.  Nose: Nose normal.  Mouth/Throat: Oropharynx is clear and moist.  Eyes: Conjunctivae and EOM are normal. Pupils are equal, round, and reactive to light.  Neck: Normal range of motion. Neck supple. No JVD present. No tracheal deviation present or  significant neck LA or mass Cardiovascular: Normal rate, regular rhythm, normal heart sounds and intact distal pulses.   Pulmonary/Chest: Effort normal and breath sounds without rales or wheezing  Abdominal: Soft. Bowel sounds are normal. NT. No HSM  Musculoskeletal: Normal range of motion. Exhibits no edema.  Lymphadenopathy:  Has no cervical adenopathy.  Neurological: Pt is alert and oriented to person, place, and time. Pt has normal reflexes. No cranial nerve deficit. Motor grossly intact Skin: Skin is warm and dry. No rash noted.  Psychiatric:  Has normal mood and affect. Behavior is normal.      Assessment & Plan:

## 2014-11-04 NOTE — Progress Notes (Signed)
Pre visit review using our clinic review tool, if applicable. No additional management support is needed unless otherwise documented below in the visit note. 

## 2014-11-04 NOTE — Patient Instructions (Signed)

## 2014-11-04 NOTE — Assessment & Plan Note (Signed)

## 2015-01-16 ENCOUNTER — Other Ambulatory Visit: Payer: Self-pay | Admitting: Internal Medicine

## 2015-11-06 ENCOUNTER — Encounter: Payer: Self-pay | Admitting: Internal Medicine

## 2015-11-06 ENCOUNTER — Ambulatory Visit (INDEPENDENT_AMBULATORY_CARE_PROVIDER_SITE_OTHER): Payer: Medicare Other | Admitting: Internal Medicine

## 2015-11-06 ENCOUNTER — Telehealth: Payer: Self-pay

## 2015-11-06 ENCOUNTER — Other Ambulatory Visit (INDEPENDENT_AMBULATORY_CARE_PROVIDER_SITE_OTHER): Payer: Medicare Other

## 2015-11-06 VITALS — BP 108/60 | HR 80 | Temp 98.5°F | Resp 20 | Wt 120.0 lb

## 2015-11-06 DIAGNOSIS — R6889 Other general symptoms and signs: Secondary | ICD-10-CM | POA: Diagnosis not present

## 2015-11-06 DIAGNOSIS — R7302 Impaired glucose tolerance (oral): Secondary | ICD-10-CM | POA: Diagnosis not present

## 2015-11-06 DIAGNOSIS — R7989 Other specified abnormal findings of blood chemistry: Secondary | ICD-10-CM | POA: Diagnosis not present

## 2015-11-06 DIAGNOSIS — Z0001 Encounter for general adult medical examination with abnormal findings: Secondary | ICD-10-CM | POA: Diagnosis not present

## 2015-11-06 DIAGNOSIS — E785 Hyperlipidemia, unspecified: Secondary | ICD-10-CM

## 2015-11-06 DIAGNOSIS — I10 Essential (primary) hypertension: Secondary | ICD-10-CM | POA: Diagnosis not present

## 2015-11-06 LAB — URINALYSIS, ROUTINE W REFLEX MICROSCOPIC
Bilirubin Urine: NEGATIVE
Ketones, ur: NEGATIVE
Nitrite: POSITIVE — AB
PH: 6.5 (ref 5.0–8.0)
SPECIFIC GRAVITY, URINE: 1.01 (ref 1.000–1.030)
TOTAL PROTEIN, URINE-UPE24: NEGATIVE
URINE GLUCOSE: NEGATIVE
UROBILINOGEN UA: 0.2 (ref 0.0–1.0)

## 2015-11-06 LAB — CBC WITH DIFFERENTIAL/PLATELET
BASOS ABS: 0 10*3/uL (ref 0.0–0.1)
Basophils Relative: 0.4 % (ref 0.0–3.0)
EOS ABS: 0 10*3/uL (ref 0.0–0.7)
Eosinophils Relative: 0.2 % (ref 0.0–5.0)
HCT: 37.8 % (ref 36.0–46.0)
Hemoglobin: 13.1 g/dL (ref 12.0–15.0)
LYMPHS ABS: 2 10*3/uL (ref 0.7–4.0)
LYMPHS PCT: 17.3 % (ref 12.0–46.0)
MCHC: 34.5 g/dL (ref 30.0–36.0)
MCV: 90.4 fl (ref 78.0–100.0)
Monocytes Absolute: 0.7 10*3/uL (ref 0.1–1.0)
Monocytes Relative: 5.8 % (ref 3.0–12.0)
NEUTROS ABS: 8.9 10*3/uL — AB (ref 1.4–7.7)
NEUTROS PCT: 76.3 % (ref 43.0–77.0)
PLATELETS: 272 10*3/uL (ref 150.0–400.0)
RBC: 4.18 Mil/uL (ref 3.87–5.11)
RDW: 12.3 % (ref 11.5–15.5)
WBC: 11.6 10*3/uL — ABNORMAL HIGH (ref 4.0–10.5)

## 2015-11-06 LAB — BASIC METABOLIC PANEL
BUN: 15 mg/dL (ref 6–23)
CALCIUM: 10.1 mg/dL (ref 8.4–10.5)
CO2: 32 meq/L (ref 19–32)
CREATININE: 0.76 mg/dL (ref 0.40–1.20)
Chloride: 97 mEq/L (ref 96–112)
GFR: 76.53 mL/min (ref >60.00)
Glucose, Bld: 118 mg/dL — ABNORMAL HIGH (ref 70–99)
Potassium: 4 mEq/L (ref 3.5–5.1)
SODIUM: 136 meq/L (ref 135–145)

## 2015-11-06 LAB — HEPATIC FUNCTION PANEL
ALBUMIN: 4.7 g/dL (ref 3.5–5.2)
ALK PHOS: 71 U/L (ref 39–117)
ALT: 14 U/L (ref 0–35)
AST: 21 U/L (ref 0–37)
BILIRUBIN DIRECT: 0.2 mg/dL (ref 0.0–0.3)
TOTAL PROTEIN: 7.5 g/dL (ref 6.0–8.3)
Total Bilirubin: 1.1 mg/dL (ref 0.2–1.2)

## 2015-11-06 LAB — LIPID PANEL
Cholesterol: 162 mg/dL (ref 0–200)
HDL: 57.8 mg/dL (ref >39.00)
LDL CALC: 84 mg/dL (ref 0–99)
NONHDL: 103.82
Total CHOL/HDL Ratio: 3
Triglycerides: 97 mg/dL (ref 0.0–149.0)
VLDL: 19.4 mg/dL (ref 0.0–40.0)

## 2015-11-06 LAB — HEMOGLOBIN A1C: Hgb A1c MFr Bld: 5.6 % (ref 4.6–6.5)

## 2015-11-06 LAB — TSH: TSH: 1.9 u[IU]/mL (ref 0.35–4.50)

## 2015-11-06 MED ORDER — LISINOPRIL-HYDROCHLOROTHIAZIDE 20-12.5 MG PO TABS: 1.0000 | ORAL_TABLET | Freq: Every day | ORAL | Status: DC

## 2015-11-06 MED ORDER — DILTIAZEM HCL ER COATED BEADS 240 MG PO CP24: 240.0000 mg | ORAL_CAPSULE | Freq: Every day | ORAL | Status: DC

## 2015-11-06 MED ORDER — ROSUVASTATIN CALCIUM 40 MG PO TABS: ORAL_TABLET | ORAL | Status: DC

## 2015-11-06 NOTE — Assessment & Plan Note (Signed)

## 2015-11-06 NOTE — Progress Notes (Signed)
Subjective:    Patient ID: Gloria [name], female    DOB: Jul 17, 1926, 82 y.o.   MRN: [id number]  HPI  Here for wellness and f/u;  Overall doing ok;  Pt denies Chest pain, worsening SOB, DOE, wheezing, orthopnea, PND, worsening LE edema, palpitations, dizziness or syncope.  Pt denies neurological change such as new headache, facial or extremity weakness.  Pt denies polydipsia, polyuria, or low sugar symptoms. Pt states overall good compliance with treatment and medications, good tolerability, and has been trying to follow appropriate diet.  Pt denies worsening depressive symptoms, suicidal ideation or panic. No fever, night sweats, wt loss, loss of appetite, or other constitutional symptoms.  Pt states good ability with ADL's, has low fall risk, home safety reviewed and adequate, no other significant changes in hearing or vision, and very active with exercise adn activity for age.  Denies hyper or hypo thyroid symptoms such as voice, skin or hair change. Denies urinary symptoms such as dysuria, frequency, urgency, flank pain, hematuria or n/v, fever, chills.  Denies worsening reflux, abd pain, dysphagia, n/v, bowel change or blood. Past Medical History  Diagnosis Date  . Impaired glucose tolerance 09/17/2011  . OSTEOPOROSIS 04/10/2007    Qualifier: Diagnosis of  By: Maris Berger   . HYPERTENSION 04/10/2007    Qualifier: Diagnosis of  By: Maris Berger   . HYPERLIPIDEMIA 04/13/2007    Qualifier: Diagnosis of  By: Jonny Ruiz MD, Len Blalock   . GERD 04/10/2007    Qualifier: Diagnosis of  By: Maris Berger   . Unspecified vitamin D deficiency 08/26/2009    Centricity Description: UNSPECIFIED VITAMIN D DEFICIENCY Qualifier: Diagnosis of  By: Jonny Ruiz MD, Len Blalock  Centricity Description: VITAMIN D DEFICIENCY Qualifier: Diagnosis of  By: Jonny Ruiz MD, Len Blalock    Past Surgical History  Procedure Laterality Date  . Cataract extraction      reports that she has never smoked. She does not have  any smokeless tobacco history on file. She reports that she does not drink alcohol or use illicit drugs. family history includes Heart disease in her father; Stroke in her sister. No Known Allergies Current Outpatient Prescriptions on File Prior to Visit  Medication Sig Dispense Refill  . aspirin 81 MG EC tablet Take 1 tablet (81 mg total) by mouth daily. Swallow whole. 30 tablet 12   No current facility-administered medications on file prior to visit.   Review of Systems Constitutional: Negative for increased diaphoresis, or other activity, appetite or siginficant weight change other than noted HENT: Negative for worsening hearing loss, ear pain, facial swelling, mouth sores and neck stiffness.   Eyes: Negative for other worsening pain, redness or visual disturbance.  Respiratory: Negative for choking or stridor Cardiovascular: Negative for other chest pain and palpitations.  Gastrointestinal: Negative for worsening diarrhea, blood in stool, or abdominal distention Genitourinary: Negative for hematuria, flank pain or change in urine volume.  Musculoskeletal: Negative for myalgias or other joint complaints.  Skin: Negative for other color change and wound or drainage.  Neurological: Negative for syncope and numbness. other than noted Hematological: Negative for adenopathy. or other swelling Psychiatric/Behavioral: Negative for hallucinations, SI, self-injury, decreased concentration or other worsening agitation.      Objective:   Physical Exam BP 108/60 mmHg  Pulse 80  Temp(Src) 98.5 F (36.9 C) (Oral)  Resp 20  Wt 120 lb (54.432 kg)  SpO2 98% VS noted,  Constitutional: Pt is oriented to person, place, and time. Appears well-developed and  well-nourished, in no significant distress Head: Normocephalic and atraumatic  Eyes: Conjunctivae and EOM are normal. Pupils are equal, round, and reactive to light Right Ear: External ear normal.  Left Ear: External ear normal Nose: Nose  normal.  Mouth/Throat: Oropharynx is clear and moist  Neck: Normal range of motion. Neck supple. No JVD present. No tracheal deviation present or significant neck LA or mass Cardiovascular: Normal rate, regular rhythm, normal heart sounds and intact distal pulses.   Pulmonary/Chest: Effort normal and breath sounds without rales or wheezing  Abdominal: Soft. Bowel sounds are normal. NT. No HSM  Musculoskeletal: Normal range of motion. Exhibits no edema Lymphadenopathy: Has no cervical adenopathy.  Neurological: Pt is alert and oriented to person, place, and time. Pt has normal reflexes. No cranial nerve deficit. Motor grossly intact Skin: Skin is warm and dry. No rash noted or new ulcers Psychiatric:  Has normal mood and affect very bright, mild nervous. Behavior is normal.     Assessment & Plan:

## 2015-11-06 NOTE — Progress Notes (Signed)
Pre visit review using our clinic review tool, if applicable. No additional management support is needed unless otherwise documented below in the visit note. 

## 2015-11-06 NOTE — Telephone Encounter (Signed)
Medication sent to pharmacy  

## 2015-11-06 NOTE — Assessment & Plan Note (Signed)
Asympt, cont diet and wt control, o/w stable overall by history and exam, recent data reviewed with pt, and pt to continue medical treatment as before,  to f/u any worsening symptoms or concerns Lab Results  Component Value Date   HGBA1C 5.5 11/04/2014

## 2015-11-06 NOTE — Assessment & Plan Note (Signed)
stable overall by history and exam, recent data reviewed with pt, and pt to continue medical treatment as before,  to f/u any worsening symptoms or concerns Lab Results  Component Value Date   LDLCALC 63 11/04/2014

## 2015-11-06 NOTE — Patient Instructions (Signed)

## 2015-11-06 NOTE — Assessment & Plan Note (Signed)
stable overall by history and exam, recent data reviewed with pt, and pt to continue medical treatment as before,  to f/u any worsening symptoms or concerns BP Readings from Last 3 Encounters:  11/06/15 108/60  11/04/14 124/80  11/01/13 136/80

## 2015-11-06 NOTE — Assessment & Plan Note (Addendum)
New issue, asympt, stable overall by history and exam, recent data reviewed with pt, and pt to continue medical treatment as before,  to f/u any worsening symptoms or concerns Lab Results  Component Value Date   TSH 5.07* 11/04/2014   For f/u tsh and free t4,  In addition to the time spent performing CPE, I spent an additional 40 minutes face to face,in which greater than 50% of this time was spent in counseling and coordination of care for patient's acute illness as documented.

## 2015-11-20 ENCOUNTER — Telehealth: Payer: Self-pay

## 2015-11-20 NOTE — Telephone Encounter (Signed)
Pt sent Dr Jonny RuizJohn a note requesting office update [facility] [address] [place] [phone] and removed the [facility].  Profile updated, pt called and advised of same.

## 2016-08-26 ENCOUNTER — Ambulatory Visit (INDEPENDENT_AMBULATORY_CARE_PROVIDER_SITE_OTHER): Payer: PPO | Admitting: Adult Health

## 2016-08-26 ENCOUNTER — Telehealth: Payer: Self-pay | Admitting: Internal Medicine

## 2016-08-26 ENCOUNTER — Ambulatory Visit (INDEPENDENT_AMBULATORY_CARE_PROVIDER_SITE_OTHER)
Admission: RE | Admit: 2016-08-26 | Discharge: 2016-08-26 | Disposition: A | Discharge status: Home / Self Care | Payer: PPO | Source: Ambulatory Visit | Attending: Adult Health | Admitting: Adult Health

## 2016-08-26 VITALS — BP 130/74 | Temp 97.6°F | Ht 64.0 in | Wt 115.8 lb

## 2016-08-26 DIAGNOSIS — R05 Cough: Secondary | ICD-10-CM | POA: Diagnosis not present

## 2016-08-26 DIAGNOSIS — J189 Pneumonia, unspecified organism: Secondary | ICD-10-CM | POA: Diagnosis not present

## 2016-08-26 DIAGNOSIS — R059 Cough, unspecified: Secondary | ICD-10-CM

## 2016-08-26 MED ORDER — DOXYCYCLINE HYCLATE 100 MG PO CAPS: 100.0000 mg | ORAL_CAPSULE | Freq: Two times a day (BID) | ORAL | 0 refills | Status: DC

## 2016-08-26 MED ORDER — BENZONATATE 100 MG PO CAPS: 100.0000 mg | ORAL_CAPSULE | Freq: Three times a day (TID) | ORAL | 1 refills | Status: DC | PRN

## 2016-08-26 NOTE — Telephone Encounter (Signed)
Noted  

## 2016-08-26 NOTE — Telephone Encounter (Signed)
PLEASE NOTE: All timestamps contained within this report are represented as [place] Standard Time. CONFIDENTIALTY NOTICE: This fax transmission is intended only for the addressee. It contains information that is legally privileged, confidential or otherwise protected from use or disclosure. If you are not the intended recipient, you are strictly prohibited from reviewing, disclosing, copying using or disseminating any of this information or taking any action in reliance on or regarding this information. If you have received this fax in error, please notify us immediately by telephone so that we can arrange for its return to us. Phone: [phone], Toll-Free: [phone], Fax: [phone] Page: 1 of 1 Call Id: [id number] [facility] - Client TELEPHONE ADVICE RECORD [facility] Patient Name: Gloria JulianSTHER Zacharia DOB: [birth year] Initial Comment aunt very tired, not eating today, yesterday was spitting up blood. Nurse Assessment Nurse: Elijah Birkaldwell, RN, Lynda Date/Time (Eastern Time): 08/26/2016 8:29:30 AM Confirm and document reason for call. If symptomatic, describe symptoms. ---Caller states her aunt is very tired, more weak, not eating today. Yesterday was spitting up blood mixed in with phlegm. Does the patient have any new or worsening symptoms? ---Yes Will a triage be completed? ---Yes Related visit to physician within the last 2 weeks? ---No Does the PT have any chronic conditions? (i.e. diabetes, asthma, etc.) ---Yes List chronic conditions. ---takes BP rx. Is this a behavioral health or substance abuse call? ---No Guidelines Guideline Title Affirmed Question Affirmed Notes Coughing Up Blood [1] Coughed up blood AND [2] > 1 tablespoon (15 ml) (Exception: blood-tinged sputum) Final Disposition User See Physician within 4 Hours (or PCP triage) Elijah Birkaldwell, RN, Lynda Referrals REFERRED TO PCP OFFICE Disagree/Comply: Comply

## 2016-08-26 NOTE — Telephone Encounter (Signed)
It looks like patient was seen by Kandee Keenory at [place] location today.

## 2016-08-26 NOTE — Telephone Encounter (Signed)
Please advise, Dr Jonny RuizJohn is out of the office until Monday.

## 2016-08-26 NOTE — Telephone Encounter (Signed)
[place], the pt's niece, called to advise pt will be staying with them a few days. After reviewing chest xray, if Kandee KeenCory needs to call in a script, please [facility]  ([place]oser to their home)

## 2016-08-26 NOTE — Telephone Encounter (Signed)
Niece following up on chest xray results.  Pt not feeling too good she says.

## 2016-08-26 NOTE — Telephone Encounter (Signed)
Updated family on chest x ray. There is no pneumonia. I am going to treat her due to age with doxycycline and tessalon pearls.   She is to follow up with Dr. Jonny RuizJohn if no improvement by next week

## 2016-08-26 NOTE — Progress Notes (Addendum)
Subjective:    Patient ID: Gloria [name], female    DOB: Nov 21, 1926, 83 y.o.   MRN: [id number]  HPI  83 year old female who  has a past medical history of GERD (04/10/2007); HYPERLIPIDEMIA (04/13/2007); HYPERTENSION (04/10/2007); Impaired glucose tolerance (09/17/2011); OSTEOPOROSIS (04/10/2007); and Unspecified vitamin D deficiency (08/26/2009). She is a patient of Dr. Jonny RuizJohn, who I am seeing today for the first time for an acute issue. She reports that for the last 24 hours she has had chest congestion,feeling fatigued, is having loss appetitie and had one instance of spitting up bright red blood blood.   She denies any fever, n/v/d.    Review of Systems  All other systems reviewed and are negative.  See HPI  Past Medical History:  Diagnosis Date  . GERD 04/10/2007   Qualifier: Diagnosis of  By: Maris BergerSherwood, Elizabeth Ann   . HYPERLIPIDEMIA 04/13/2007   Qualifier: Diagnosis of  By: Jonny RuizJohn MD, Len BlalockJames W   . HYPERTENSION 04/10/2007   Qualifier: Diagnosis of  By: Maris BergerSherwood, Elizabeth Ann   . Impaired glucose tolerance 09/17/2011  . OSTEOPOROSIS 04/10/2007   Qualifier: Diagnosis of  By: Maris BergerSherwood, Elizabeth Ann   . Unspecified vitamin D deficiency 08/26/2009   Centricity Description: UNSPECIFIED VITAMIN D DEFICIENCY Qualifier: Diagnosis of  By: Jonny RuizJohn MD, Len BlalockJames W  Centricity Description: VITAMIN D DEFICIENCY Qualifier: Diagnosis of  By: Jonny RuizJohn MD, Len BlalockJames W     Social History   Social History  . Marital status: Single    Spouse name: N/A  . Number of children: N/A  . Years of education: N/A   Occupational History  . Not on file.   Social History Main Topics  . Smoking status: Never Smoker  . Smokeless tobacco: Not on file  . Alcohol use No  . Drug use: No  . Sexual activity: Not on file   Other Topics Concern  . Not on file   Social History Narrative  . No narrative on file    Past Surgical History:  Procedure Laterality Date  . CATARACT EXTRACTION      Family History  Problem  Relation Age of Onset  . Heart disease Father   . Stroke Sister     No Known Allergies  Current Outpatient Prescriptions on File Prior to Visit  Medication Sig Dispense Refill  . aspirin 81 MG EC tablet Take 1 tablet (81 mg total) by mouth daily. Swallow whole. 30 tablet 12  . diltiazem (CARDIZEM CD) 240 MG 24 hr capsule Take 1 capsule (240 mg total) by mouth daily. 90 capsule 3  . lisinopril-hydrochlorothiazide (PRINZIDE,ZESTORETIC) 20-12.5 MG tablet Take 1 tablet by mouth daily. 90 tablet 3  . rosuvastatin (CRESTOR) 40 MG tablet TAKE 1 TABLET EVERY DAY 90 tablet 3   No current facility-administered medications on file prior to visit.     BP 130/74   Temp 97.6 F (36.4 C) (Oral)   Ht 5\' 4"  (1.626 m)   Wt 115 lb 12.8 oz (52.5 kg)   BMI 19.88 kg/m       Objective:   Physical Exam  Constitutional: She is oriented to person, place, and time. She appears well-developed and well-nourished. No distress.  HENT:  Head: Normocephalic and atraumatic.  Right Ear: External ear normal.  Left Ear: External ear normal.  Nose: Nose normal.  Mouth/Throat: Oropharynx is clear and moist. No oropharyngeal exudate.  Eyes: Conjunctivae and EOM are normal. Pupils are equal, round, and reactive to light. Right eye exhibits no discharge.  Left eye exhibits no discharge. No scleral icterus.  Neck: Normal range of motion. Neck supple.  Cardiovascular: Normal rate, regular rhythm, normal heart sounds and intact distal pulses.  Exam reveals no gallop and no friction rub.   No murmur heard. Pulmonary/Chest: Effort normal. No respiratory distress. She has wheezes (wheezing at bases). She has no rales. She exhibits no tenderness.  Neurological: She is alert and oriented to person, place, and time.  Skin: Skin is warm and dry. No rash noted. She is not diaphoretic. No erythema. No pallor.  Psychiatric: She has a normal mood and affect. Her behavior is normal. Judgment and thought content normal.  Nursing  note and vitals reviewed.     Assessment & Plan:   1. Cough - DG Chest 2 View; Future - Will treat as directed once X ray has been read   Shirline Frees, NP

## 2016-11-08 ENCOUNTER — Encounter: Payer: Self-pay | Admitting: Internal Medicine

## 2016-11-08 ENCOUNTER — Other Ambulatory Visit (INDEPENDENT_AMBULATORY_CARE_PROVIDER_SITE_OTHER): Payer: PPO

## 2016-11-08 ENCOUNTER — Ambulatory Visit (INDEPENDENT_AMBULATORY_CARE_PROVIDER_SITE_OTHER): Payer: PPO | Admitting: Internal Medicine

## 2016-11-08 VITALS — BP 138/84 | HR 75 | Ht 63.0 in | Wt 118.0 lb

## 2016-11-08 DIAGNOSIS — Z0000 Encounter for general adult medical examination without abnormal findings: Secondary | ICD-10-CM

## 2016-11-08 DIAGNOSIS — R011 Cardiac murmur, unspecified: Secondary | ICD-10-CM | POA: Diagnosis not present

## 2016-11-08 DIAGNOSIS — R7302 Impaired glucose tolerance (oral): Secondary | ICD-10-CM | POA: Diagnosis not present

## 2016-11-08 DIAGNOSIS — M810 Age-related osteoporosis without current pathological fracture: Secondary | ICD-10-CM | POA: Diagnosis not present

## 2016-11-08 LAB — URINALYSIS, ROUTINE W REFLEX MICROSCOPIC
Bilirubin Urine: NEGATIVE
Hgb urine dipstick: NEGATIVE
Ketones, ur: NEGATIVE
Nitrite: POSITIVE — AB
PH: 7.5 (ref 5.0–8.0)
RBC / HPF: NONE SEEN (ref 0–?)
SPECIFIC GRAVITY, URINE: 1.01 (ref 1.000–1.030)
TOTAL PROTEIN, URINE-UPE24: NEGATIVE
URINE GLUCOSE: NEGATIVE
UROBILINOGEN UA: 0.2 (ref 0.0–1.0)

## 2016-11-08 LAB — CBC WITH DIFFERENTIAL/PLATELET
BASOS ABS: 0 10*3/uL (ref 0.0–0.1)
Basophils Relative: 0.6 % (ref 0.0–3.0)
EOS ABS: 0.1 10*3/uL (ref 0.0–0.7)
Eosinophils Relative: 0.7 % (ref 0.0–5.0)
HCT: 37.9 % (ref 36.0–46.0)
Hemoglobin: 13.3 g/dL (ref 12.0–15.0)
LYMPHS ABS: 1.9 10*3/uL (ref 0.7–4.0)
Lymphocytes Relative: 25.6 % (ref 12.0–46.0)
MCHC: 35 g/dL (ref 30.0–36.0)
MCV: 91 fl (ref 78.0–100.0)
Monocytes Absolute: 0.6 10*3/uL (ref 0.1–1.0)
Monocytes Relative: 7.7 % (ref 3.0–12.0)
NEUTROS ABS: 4.8 10*3/uL (ref 1.4–7.7)
NEUTROS PCT: 65.4 % (ref 43.0–77.0)
PLATELETS: 219 10*3/uL (ref 150.0–400.0)
RBC: 4.16 Mil/uL (ref 3.87–5.11)
RDW: 12.7 % (ref 11.5–15.5)
WBC: 7.4 10*3/uL (ref 4.0–10.5)

## 2016-11-08 LAB — LIPID PANEL
CHOLESTEROL: 141 mg/dL (ref 0–200)
HDL: 62.3 mg/dL (ref >39.00)
LDL Cholesterol: 65 mg/dL (ref 0–99)
NONHDL: 78.34
Total CHOL/HDL Ratio: 2
Triglycerides: 68 mg/dL (ref 0.0–149.0)
VLDL: 13.6 mg/dL (ref 0.0–40.0)

## 2016-11-08 LAB — HEPATIC FUNCTION PANEL
ALBUMIN: 4.5 g/dL (ref 3.5–5.2)
ALK PHOS: 67 U/L (ref 39–117)
ALT: 18 U/L (ref 0–35)
AST: 24 U/L (ref 0–37)
BILIRUBIN DIRECT: 0.2 mg/dL (ref 0.0–0.3)
TOTAL PROTEIN: 7.4 g/dL (ref 6.0–8.3)
Total Bilirubin: 1.1 mg/dL (ref 0.2–1.2)

## 2016-11-08 LAB — BASIC METABOLIC PANEL
BUN: 13 mg/dL (ref 6–23)
CHLORIDE: 98 meq/L (ref 96–112)
CO2: 30 meq/L (ref 19–32)
CREATININE: 0.85 mg/dL (ref 0.40–1.20)
Calcium: 10.1 mg/dL (ref 8.4–10.5)
GFR: 67.1 mL/min (ref >60.00)
GLUCOSE: 107 mg/dL — AB (ref 70–99)
Potassium: 4.1 mEq/L (ref 3.5–5.1)
SODIUM: 136 meq/L (ref 135–145)

## 2016-11-08 LAB — TSH: TSH: 6.75 u[IU]/mL — ABNORMAL HIGH (ref 0.35–4.50)

## 2016-11-08 MED ORDER — LISINOPRIL-HYDROCHLOROTHIAZIDE 20-12.5 MG PO TABS: 1.0000 | ORAL_TABLET | Freq: Every day | ORAL | 3 refills | Status: DC

## 2016-11-08 MED ORDER — DILTIAZEM HCL ER COATED BEADS 240 MG PO CP24: 240.0000 mg | ORAL_CAPSULE | Freq: Every day | ORAL | 3 refills | Status: DC

## 2016-11-08 MED ORDER — ROSUVASTATIN CALCIUM 40 MG PO TABS: ORAL_TABLET | ORAL | 3 refills | Status: DC

## 2016-11-08 NOTE — Assessment & Plan Note (Signed)
Declines f/u dxa or tx at this time

## 2016-11-08 NOTE — Patient Instructions (Signed)
Please continue all other medications as before, and refills have been done if requested.  Please have the pharmacy call with any other refills you may need.  Please continue your efforts at being more active, low cholesterol diet, and weight control.  You are otherwise up to date with prevention measures today.  Please keep your appointments with your specialists as you may have planned  Please go to the LAB in the Basement (turn left off the elevator) for the tests to be done today  You will be contacted by phone if any changes need to be made immediately.  Otherwise, you will receive a letter about your results with an explanation, but please check with MyChart first.  Please remember to sign up for MyChart if you have not done so, as this will be important to you in the future with finding out test results, communicating by private email, and scheduling acute appointments online when needed.  If you have Medicare related insurance (such as traditional Medicare, Blue Cross Medicare or United HealthCare Medicare, or similar), Please make an appointment at the Scheduling desk with Jill, the Wellness HealthCoach, for your Wellness Visit in this office, which is a benefit with your insurance.  Please return in 1 year for your yearly visit, or sooner if needed, with Lab testing done 3-5 days before  

## 2016-11-08 NOTE — Progress Notes (Signed)
Pre visit review using our clinic review tool, if applicable. No additional management support is needed unless otherwise documented below in the visit note. 

## 2016-11-08 NOTE — Assessment & Plan Note (Addendum)
Apparently asympt, ok to follow, pt declines echo

## 2016-11-08 NOTE — Progress Notes (Signed)
Subjective:    Patient ID: Gloria [name], female    DOB: October 11, 1926, 83 y.o.   MRN: [id number]  HPI  Here for wellness and f/u;  Overall doing ok;  Pt denies Chest pain, worsening SOB, DOE, wheezing, orthopnea, PND, worsening LE edema, palpitations, dizziness or syncope.  Pt denies neurological change such as new headache, facial or extremity weakness.  Pt denies polydipsia, polyuria, or low sugar symptoms. Pt states overall good compliance with treatment and medications, good tolerability, and has been trying to follow appropriate diet.  Pt denies worsening depressive symptoms, suicidal ideation or panic. No fever, night sweats, wt loss, loss of appetite, or other constitutional symptoms.  Pt states good ability with ADL's, has low fall risk, home safety reviewed and adequate, no other significant changes in hearing or vision, and only occasionally active with exercise. Declines DXA.for now, does not want tx.    No new complaints.   Wt Readings from Last 3 Encounters:  11/08/16 118 lb (53.5 kg)  08/26/16 115 lb 12.8 oz (52.5 kg)  11/06/15 120 lb (54.4 kg)   BP Readings from Last 3 Encounters:  11/08/16 138/84  08/26/16 130/74  11/06/15 108/60   Past Medical History:  Diagnosis Date  . GERD 04/10/2007   Qualifier: Diagnosis of  By: Maris Berger   . HYPERLIPIDEMIA 04/13/2007   Qualifier: Diagnosis of  By: Jonny Ruiz MD, Len Blalock   . HYPERTENSION 04/10/2007   Qualifier: Diagnosis of  By: Maris Berger   . Impaired glucose tolerance 09/17/2011  . OSTEOPOROSIS 04/10/2007   Qualifier: Diagnosis of  By: Maris Berger   . Unspecified vitamin D deficiency 08/26/2009   Centricity Description: UNSPECIFIED VITAMIN D DEFICIENCY Qualifier: Diagnosis of  By: Jonny Ruiz MD, Len Blalock  Centricity Description: VITAMIN D DEFICIENCY Qualifier: Diagnosis of  By: Jonny Ruiz MD, Len Blalock    Past Surgical History:  Procedure Laterality Date  . CATARACT EXTRACTION      reports that she has never  smoked. She has never used smokeless tobacco. She reports that she does not drink alcohol or use drugs. family history includes Heart disease in her father; Stroke in her sister. No Known Allergies Current Outpatient Prescriptions on File Prior to Visit  Medication Sig Dispense Refill  . aspirin 81 MG EC tablet Take 1 tablet (81 mg total) by mouth daily. Swallow whole. 30 tablet 12   No current facility-administered medications on file prior to visit.    Review of Systems Constitutional: Negative for other unusual diaphoresis, sweats, appetite or weight changes HENT: Negative for other worsening hearing loss, ear pain, facial swelling, mouth sores or neck stiffness.   Eyes: Negative for other worsening pain, redness or other visual disturbance.  Respiratory: Negative for other stridor or swelling Cardiovascular: Negative for other palpitations or other chest pain  Gastrointestinal: Negative for worsening diarrhea or loose stools, blood in stool, distention or other pain Genitourinary: Negative for hematuria, flank pain or other change in urine volume.  Musculoskeletal: Negative for myalgias or other joint swelling.  Skin: Negative for other color change, or other wound or worsening drainage.  Neurological: Negative for other syncope or numbness. Hematological: Negative for other adenopathy or swelling Psychiatric/Behavioral: Negative for hallucinations, other worsening agitation, SI, self-injury, or new decreased concentration All other system neg per pt    Objective:   Physical Exam BP 138/84   Pulse 75   Ht  (1.6 m)   Wt 118 lb (53.5 kg)   SpO2 98%  BMI 20.90 kg/m  VS noted,  Constitutional: Pt is oriented to person, place, and time. Appears well-developed and well-nourished, in no significant distress and comfortable Head: Normocephalic and atraumatic  Eyes: Conjunctivae and EOM are normal. Pupils are equal, round, and reactive to light Right Ear: External ear normal  without discharge Left Ear: External ear normal without discharge Nose: Nose without discharge or deformity Mouth/Throat: Oropharynx is without other ulcerations and moist  Neck: Normal range of motion. Neck supple. No JVD present. No tracheal deviation present or significant neck LA or mass Cardiovascular: Normal rate, regular rhythm, normal heart sounds except for chronic 3/6 systolic murmur RUSB and intact distal pulses.   Pulmonary/Chest: WOB normal and breath sounds without rales or wheezing  Abdominal: Soft. Bowel sounds are normal. NT. No HSM  Musculoskeletal: Normal range of motion. Exhibits no edema Lymphadenopathy: Has no other cervical adenopathy.  Neurological: Pt is alert and oriented to person, place, and time. Pt has normal reflexes. No cranial nerve deficit. Motor grossly intact, Gait intact Skin: Skin is warm and dry. No rash noted or new ulcerations Psychiatric:  Has normal mood and affect. Behavior is normal without agitation No other exam findings    Assessment & Plan:

## 2016-11-08 NOTE — Assessment & Plan Note (Signed)

## 2016-11-08 NOTE — Assessment & Plan Note (Signed)
stable overall by history and exam, recent data reviewed with pt, and pt to continue medical treatment as before,  to f/u any worsening symptoms or concerns Lab Results  Component Value Date   HGBA1C 5.6 11/06/2015   For f/u lab

## 2017-03-22 ENCOUNTER — Telehealth: Payer: Self-pay | Admitting: Internal Medicine

## 2017-03-22 NOTE — Telephone Encounter (Signed)
Called pt to schedule AWV. Left msg for pt to call office to schedule appt. Pt has not completed AWV. Appt can be scheduled at anytime. SF

## 2017-08-17 ENCOUNTER — Other Ambulatory Visit (INDEPENDENT_AMBULATORY_CARE_PROVIDER_SITE_OTHER): Payer: PPO

## 2017-08-17 ENCOUNTER — Other Ambulatory Visit: Payer: Self-pay | Admitting: Internal Medicine

## 2017-08-17 ENCOUNTER — Encounter: Payer: Self-pay | Admitting: Internal Medicine

## 2017-08-17 ENCOUNTER — Ambulatory Visit (INDEPENDENT_AMBULATORY_CARE_PROVIDER_SITE_OTHER)
Admission: RE | Admit: 2017-08-17 | Discharge: 2017-08-17 | Disposition: A | Discharge status: Home / Self Care | Payer: PPO | Source: Ambulatory Visit | Attending: Internal Medicine | Admitting: Internal Medicine

## 2017-08-17 ENCOUNTER — Ambulatory Visit (INDEPENDENT_AMBULATORY_CARE_PROVIDER_SITE_OTHER): Payer: PPO | Admitting: Internal Medicine

## 2017-08-17 ENCOUNTER — Telehealth: Payer: Self-pay

## 2017-08-17 VITALS — BP 126/76 | HR 97 | Temp 97.9°F | Ht 63.0 in | Wt 119.0 lb

## 2017-08-17 DIAGNOSIS — I10 Essential (primary) hypertension: Secondary | ICD-10-CM | POA: Diagnosis not present

## 2017-08-17 DIAGNOSIS — R05 Cough: Secondary | ICD-10-CM

## 2017-08-17 DIAGNOSIS — R7302 Impaired glucose tolerance (oral): Secondary | ICD-10-CM

## 2017-08-17 DIAGNOSIS — R062 Wheezing: Secondary | ICD-10-CM | POA: Diagnosis not present

## 2017-08-17 DIAGNOSIS — R059 Cough, unspecified: Secondary | ICD-10-CM

## 2017-08-17 LAB — BASIC METABOLIC PANEL
BUN: 17 mg/dL (ref 6–23)
CO2: 29 mEq/L (ref 19–32)
Calcium: 9.8 mg/dL (ref 8.4–10.5)
Chloride: 91 mEq/L — ABNORMAL LOW (ref 96–112)
Creatinine, Ser: 0.78 mg/dL (ref 0.40–1.20)
GFR: 73.96 mL/min (ref >60.00)
Glucose, Bld: 161 mg/dL — ABNORMAL HIGH (ref 70–99)
POTASSIUM: 3 meq/L — AB (ref 3.5–5.1)
SODIUM: 131 meq/L — AB (ref 135–145)

## 2017-08-17 LAB — HEPATIC FUNCTION PANEL
ALT: 15 U/L (ref 0–35)
AST: 19 U/L (ref 0–37)
Albumin: 4.6 g/dL (ref 3.5–5.2)
Alkaline Phosphatase: 67 U/L (ref 39–117)
Bilirubin, Direct: 0.3 mg/dL (ref 0.0–0.3)
TOTAL PROTEIN: 7.8 g/dL (ref 6.0–8.3)
Total Bilirubin: 1.7 mg/dL — ABNORMAL HIGH (ref 0.2–1.2)

## 2017-08-17 LAB — CBC WITH DIFFERENTIAL/PLATELET
BASOS PCT: 0.5 % (ref 0.0–3.0)
Basophils Absolute: 0.1 10*3/uL (ref 0.0–0.1)
Eosinophils Absolute: 0 10*3/uL (ref 0.0–0.7)
Eosinophils Relative: 0 % (ref 0.0–5.0)
HEMATOCRIT: 41.7 % (ref 36.0–46.0)
HEMOGLOBIN: 14.5 g/dL (ref 12.0–15.0)
LYMPHS PCT: 7.9 % — AB (ref 12.0–46.0)
Lymphs Abs: 1 10*3/uL (ref 0.7–4.0)
MCHC: 34.8 g/dL (ref 30.0–36.0)
MCV: 89.8 fl (ref 78.0–100.0)
MONO ABS: 0.5 10*3/uL (ref 0.1–1.0)
Monocytes Relative: 3.7 % (ref 3.0–12.0)
Neutro Abs: 11.5 10*3/uL — ABNORMAL HIGH (ref 1.4–7.7)
Neutrophils Relative %: 87.9 % — ABNORMAL HIGH (ref 43.0–77.0)
Platelets: 265 10*3/uL (ref 150.0–400.0)
RBC: 4.64 Mil/uL (ref 3.87–5.11)
RDW: 12.5 % (ref 11.5–15.5)
WBC: 13 10*3/uL — AB (ref 4.0–10.5)

## 2017-08-17 LAB — BRAIN NATRIURETIC PEPTIDE: Pro B Natriuretic peptide (BNP): 246 pg/mL — ABNORMAL HIGH (ref 0.0–100.0)

## 2017-08-17 MED ORDER — LEVOFLOXACIN 250 MG PO TABS: 250.0000 mg | ORAL_TABLET | Freq: Every day | ORAL | 0 refills | Status: AC

## 2017-08-17 MED ORDER — METHYLPREDNISOLONE ACETATE 80 MG/ML IJ SUSP
80.0000 mg | Freq: Once | INTRAMUSCULAR | Status: AC
  Administered 2017-08-17: 80 mg via INTRAMUSCULAR

## 2017-08-17 MED ORDER — HYDROCODONE-HOMATROPINE 5-1.5 MG/5ML PO SYRP: 5.0000 mL | ORAL_SOLUTION | Freq: Four times a day (QID) | ORAL | 0 refills | Status: AC | PRN

## 2017-08-17 MED ORDER — ONDANSETRON HCL 4 MG PO TABS: 4.0000 mg | ORAL_TABLET | Freq: Three times a day (TID) | ORAL | 1 refills | Status: DC | PRN

## 2017-08-17 MED ORDER — CEFTRIAXONE SODIUM 1 G IJ SOLR
1.0000 g | Freq: Once | INTRAMUSCULAR | Status: AC
  Administered 2017-08-17: 1 g via INTRAMUSCULAR

## 2017-08-17 MED ORDER — PREDNISONE 10 MG PO TABS: ORAL_TABLET | ORAL | 0 refills | Status: DC

## 2017-08-17 MED ORDER — POTASSIUM CHLORIDE CRYS ER 10 MEQ PO TBCR: EXTENDED_RELEASE_TABLET | ORAL | 0 refills | Status: DC

## 2017-08-17 NOTE — Progress Notes (Signed)
Subjective:    Patient ID: Gloria [name], female    DOB: 1927/04/04, 84 y.o.   MRN: [id number]  HPI   Here with acute onset mild to mod 2-3 days ST, HA, general weakness and malaise, with prod cough greenish sputum, but Pt denies chest pain, increased sob or doe, wheezing, orthopnea, PND, increased LE swelling, palpitations, dizziness or syncope, except for mild sob and wheeziness began last night.  Pt denies new neurological symptoms such as new headache, or facial or extremity weakness or numbness   Pt denies polydipsia, polyuria Past Medical History:  Diagnosis Date  . GERD 04/10/2007   Qualifier: Diagnosis of  By: Maris Berger   . HYPERLIPIDEMIA 04/13/2007   Qualifier: Diagnosis of  By: Jonny Ruiz MD, Len Blalock   . HYPERTENSION 04/10/2007   Qualifier: Diagnosis of  By: Maris Berger   . Impaired glucose tolerance 09/17/2011  . OSTEOPOROSIS 04/10/2007   Qualifier: Diagnosis of  By: Maris Berger   . Unspecified vitamin D deficiency 08/26/2009   Centricity Description: UNSPECIFIED VITAMIN D DEFICIENCY Qualifier: Diagnosis of  By: Jonny Ruiz MD, Len Blalock  Centricity Description: VITAMIN D DEFICIENCY Qualifier: Diagnosis of  By: Jonny Ruiz MD, Len Blalock    Past Surgical History:  Procedure Laterality Date  . CATARACT EXTRACTION      reports that  has never smoked. she has never used smokeless tobacco. She reports that she does not drink alcohol or use drugs. family history includes Heart disease in her father; Stroke in her sister. No Known Allergies Current Outpatient Medications on File Prior to Visit  Medication Sig Dispense Refill  . aspirin 81 MG EC tablet Take 1 tablet (81 mg total) by mouth daily. Swallow whole. 30 tablet 12  . diltiazem (CARDIZEM CD) 240 MG 24 hr capsule Take 1 capsule (240 mg total) by mouth daily. 90 capsule 3  . lisinopril-hydrochlorothiazide (PRINZIDE,ZESTORETIC) 20-12.5 MG tablet Take 1 tablet by mouth daily. 90 tablet 3  . rosuvastatin (CRESTOR) 40  MG tablet TAKE 1 TABLET EVERY DAY 90 tablet 3   No current facility-administered medications on file prior to visit.    Review of Systems  Constitutional: Negative for other unusual diaphoresis or sweats HENT: Negative for ear discharge or swelling Eyes: Negative for other worsening visual disturbances Respiratory: Negative for stridor or other swelling  Gastrointestinal: Negative for worsening distension or other blood Genitourinary: Negative for retention or other urinary change Musculoskeletal: Negative for other MSK pain or swelling Skin: Negative for color change or other new lesions Neurological: Negative for worsening tremors and other numbness  Psychiatric/Behavioral: Negative for worsening agitation or other fatigue All other system neg per pt    Objective:   Physical Exam BP 126/76   Pulse 97   Temp 97.9 F (36.6 C) (Oral)   Ht 5\' 3"  (1.6 m)   Wt 119 lb (54 kg)   SpO2 96%   BMI 21.08 kg/m  VS noted, mod ill, with some general weakness, needs some assist to get up on exam table Constitutional: Pt appears in NAD HENT: Head: NCAT.  Right Ear: External ear normal.  Left Ear: External ear normal.  Eyes: . Pupils are equal, round, and reactive to light. Conjunctivae and EOM are normal Nose: without d/c or deformity Neck: Neck supple. Gross normal ROM Cardiovascular: Normal rate and regular rhythm.   Pulmonary/Chest: Effort normal and breath sounds with course BS throughout with rhonichi, few RLL rales Neurological: Pt is alert. At baseline orientation, motor grossly  intact Skin: Skin is warm. No rashes, other new lesions, no LE edema Psychiatric: Pt behavior is normal without agitation  No other exam findings     Assessment & Plan:

## 2017-08-17 NOTE — Telephone Encounter (Signed)
Pt has been informed. She will speak with her nephew that was here during her OV to see what would be a good time for him to be able to bring her for an appt on Monday 2/11

## 2017-08-17 NOTE — Telephone Encounter (Signed)
-----   Message from Corwin LevinsJames W [name], MD sent at 08/17/2017  1:05 PM EST ----- .Letter sent, cont same tx except  The test results show that your current treatment is OK, except the results are consistent with a situation of possible pneumonia and vomiting as we discussed.  Please take also several days of potassium pill - I will send the prescription, and please make return office visit about Mon  Feb 11.    Anjalee Cope to please inform pt, I will do rx

## 2017-08-17 NOTE — Patient Instructions (Addendum)
You had the steroid shot today, and the antibiotic shot  Please take all new medication as prescribed - the antibiotic, cough medicine, prednisone, and nausea medication if needed  Please consider going to the Emergency Department at the hospital appointment if you are any worse in the next few days, such as worsening fever, cough, pain, shortness of breath, or worsening weakness or falls  Please continue all other medications as before, and refills have been done if requested.  Please have the pharmacy call with any other refills you may need.  Please keep your appointments with your specialists as you may have planned  Please go to the XRAY Department in the Basement (go straight as you get off the elevator) for the x-ray testing  Please go to the LAB in the Basement (turn left off the elevator) for the tests to be done today  You will be contacted by phone if any changes need to be made immediately.  Otherwise, you will receive a letter about your results with an explanation, but please check with MyChart first.  Please remember to sign up for MyChart if you have not done so, as this will be important to you in the future with finding out test results, communicating by private email, and scheduling acute appointments online when needed.

## 2017-08-18 ENCOUNTER — Telehealth: Payer: Self-pay

## 2017-08-18 NOTE — Telephone Encounter (Signed)
-----   Message from Corwin LevinsJames W [name], MD sent at 08/17/2017  5:25 PM EST ----- Letter sent, cont same tx except  The test results show that your current treatment is OK, except the xray does show right lung pneumonia.  Please be sure to take all of your medication as prescribed.Gloria [name].    Shirron to please inform pt, AND ask her to make appt in 3 -4 wks for repeat cxr

## 2017-08-18 NOTE — Telephone Encounter (Signed)
I tried to reach patient but did not get an answer. I reached out to pt's nephew, Mellody DanceKeith, that came with her during her OV and has been informed and expressed understanding. He will let patient know results.

## 2017-08-20 ENCOUNTER — Encounter: Payer: Self-pay | Admitting: Internal Medicine

## 2017-08-20 NOTE — Assessment & Plan Note (Signed)
Mild, for depomedrol IM 80, predpac asd,  to f/u any worsening symptoms or concerns 

## 2017-08-20 NOTE — Assessment & Plan Note (Signed)
stable overall by history and exam, recent data reviewed with pt, and pt to continue medical treatment as before,  to f/u any worsening symptoms or concerns BP Readings from Last 3 Encounters:  08/17/17 126/76  11/08/16 138/84  08/26/16 130/74

## 2017-08-20 NOTE — Assessment & Plan Note (Signed)
Mild to mod, c/w bronchitis vs pna, for rocephin IM 1 gm, oral antibx, for cxr r/o pna,  to f/u any worsening symptoms or concerns

## 2017-08-20 NOTE — Assessment & Plan Note (Signed)
Lab Results  Component Value Date   HGBA1C 5.6 11/06/2015  stable overall by history and exam, recent data reviewed with pt, and pt to continue medical treatment as before,  to f/u any worsening symptoms or concerns

## 2017-08-21 ENCOUNTER — Ambulatory Visit: Payer: PPO | Admitting: Internal Medicine

## 2017-08-25 ENCOUNTER — Telehealth: Payer: Self-pay | Admitting: Internal Medicine

## 2017-08-25 NOTE — Telephone Encounter (Signed)
Pt has been informed and expressed understanding.  

## 2017-08-25 NOTE — Telephone Encounter (Signed)
Copied from CRM [phone]. Topic: General - Other >> Aug 25, 2017 11:28 AM Debroah LoopLander, Lumin L wrote: Reason for CRM: Amber, niece, calling to find out if the patient is still supposed to be taking potassium chloride? Only 6 tabs were prescribed on 08/17/2017 w/ 0 refills by Dr. Jonny RuizJohn. Niece not on DPR but f/u w/ patient if needed or call Ronal FearKeith Dawes [place].

## 2017-08-25 NOTE — Telephone Encounter (Signed)
No, this was a short course only as she is not on a medication to make her need potassium on a regular basis

## 2017-09-12 ENCOUNTER — Ambulatory Visit (INDEPENDENT_AMBULATORY_CARE_PROVIDER_SITE_OTHER)
Admission: RE | Admit: 2017-09-12 | Discharge: 2017-09-12 | Disposition: A | Discharge status: Home / Self Care | Payer: PPO | Source: Ambulatory Visit | Attending: Internal Medicine | Admitting: Internal Medicine

## 2017-09-12 ENCOUNTER — Encounter: Payer: Self-pay | Admitting: Internal Medicine

## 2017-09-12 ENCOUNTER — Other Ambulatory Visit: Payer: Self-pay | Admitting: Internal Medicine

## 2017-09-12 ENCOUNTER — Ambulatory Visit (INDEPENDENT_AMBULATORY_CARE_PROVIDER_SITE_OTHER): Payer: PPO | Admitting: Internal Medicine

## 2017-09-12 VITALS — BP 116/78 | HR 96 | Temp 98.1°F | Ht 63.0 in | Wt 115.0 lb

## 2017-09-12 DIAGNOSIS — R911 Solitary pulmonary nodule: Secondary | ICD-10-CM

## 2017-09-12 DIAGNOSIS — R7302 Impaired glucose tolerance (oral): Secondary | ICD-10-CM

## 2017-09-12 DIAGNOSIS — R062 Wheezing: Secondary | ICD-10-CM | POA: Diagnosis not present

## 2017-09-12 DIAGNOSIS — J189 Pneumonia, unspecified organism: Secondary | ICD-10-CM | POA: Diagnosis not present

## 2017-09-12 DIAGNOSIS — J181 Lobar pneumonia, unspecified organism: Secondary | ICD-10-CM

## 2017-09-12 NOTE — Progress Notes (Signed)
Subjective:    Patient ID: Gloria [name], female    DOB: 1927/05/26, 84 y.o.   MRN: [id number]  HPI  Here to f/u recent CAP, tolerated med tx and good compliance, now states no fever, cough and Pt denies chest pain, increased sob or doe, wheezing, orthopnea, PND, increased LE swelling, palpitations, dizziness or syncope.  Walks quite bit in the house purposefully for exercise last few days, plans to get outside when weather better.  Pt denies new neurological symptoms such as new headache, or facial or extremity weakness or numbness   Pt denies polydipsia, polyuria. Past Medical History:  Diagnosis Date  . GERD 04/10/2007   Qualifier: Diagnosis of  By: Maris Berger   . HYPERLIPIDEMIA 04/13/2007   Qualifier: Diagnosis of  By: Jonny Ruiz MD, Len Blalock   . HYPERTENSION 04/10/2007   Qualifier: Diagnosis of  By: Maris Berger   . Impaired glucose tolerance 09/17/2011  . OSTEOPOROSIS 04/10/2007   Qualifier: Diagnosis of  By: Maris Berger   . Unspecified vitamin D deficiency 08/26/2009   Centricity Description: UNSPECIFIED VITAMIN D DEFICIENCY Qualifier: Diagnosis of  By: Jonny Ruiz MD, Len Blalock  Centricity Description: VITAMIN D DEFICIENCY Qualifier: Diagnosis of  By: Jonny Ruiz MD, Len Blalock    Past Surgical History:  Procedure Laterality Date  . CATARACT EXTRACTION      reports that  has never smoked. she has never used smokeless tobacco. She reports that she does not drink alcohol or use drugs. family history includes Heart disease in her father; Stroke in her sister. No Known Allergies Current Outpatient Medications on File Prior to Visit  Medication Sig Dispense Refill  . aspirin 81 MG EC tablet Take 1 tablet (81 mg total) by mouth daily. Swallow whole. 30 tablet 12  . diltiazem (CARDIZEM CD) 240 MG 24 hr capsule Take 1 capsule (240 mg total) by mouth daily. 90 capsule 3  . lisinopril-hydrochlorothiazide (PRINZIDE,ZESTORETIC) 20-12.5 MG tablet Take 1 tablet by mouth daily. 90  tablet 3  . ondansetron (ZOFRAN) 4 MG tablet Take 1 tablet (4 mg total) by mouth every 8 (eight) hours as needed for nausea or vomiting. 20 tablet 1  . potassium chloride (KLOR-CON M10) 10 MEQ tablet 2 tab by mouth daily for 3 days 6 tablet 0  . predniSONE (DELTASONE) 10 MG tablet 3 tabs by mouth per day for 3 days,2tabs per day for 3 days,1tab per day for 3 days 18 tablet 0  . rosuvastatin (CRESTOR) 40 MG tablet TAKE 1 TABLET EVERY DAY 90 tablet 3   No current facility-administered medications on file prior to visit.    Review of Systems  Constitutional: Negative for other unusual diaphoresis or sweats HENT: Negative for ear discharge or swelling Eyes: Negative for other worsening visual disturbances Respiratory: Negative for stridor or other swelling  Gastrointestinal: Negative for worsening distension or other blood Genitourinary: Negative for retention or other urinary change Musculoskeletal: Negative for other MSK pain or swelling Skin: Negative for color change or other new lesions Neurological: Negative for worsening tremors and other numbness  Psychiatric/Behavioral: Negative for worsening agitation or other fatigue All other system neg  Per pt    Objective:   Physical Exam BP 116/78   Pulse 96   Temp 98.1 F (36.7 C) (Oral)   Ht 5\' 3"  (1.6 m)   Wt 115 lb (52.2 kg)   SpO2 100%   BMI 20.37 kg/m  VS noted, not ill appearing, not quite as vigourous as usual in  her moveements Constitutional: Pt appears in NAD HENT: Head: NCAT.  Right Ear: External ear normal.  Left Ear: External ear normal.  Eyes: . Pupils are equal, round, and reactive to light. Conjunctivae and EOM are normal Nose: without d/c or deformity Neck: Neck supple. Gross normal ROM Cardiovascular: Normal rate and regular rhythm.   Pulmonary/Chest: Effort normal and breath sounds without rales or wheezing.  Abd:  Soft, NT, ND, + BS, no organomegaly Neurological: Pt is alert. At baseline orientation, motor  grossly intact Skin: Skin is warm. No rashes, other new lesions, no LE edema Psychiatric: Pt behavior is normal without agitation  No other exam findings    Assessment & Plan:

## 2017-09-12 NOTE — Patient Instructions (Addendum)
Please continue all other medications as before, and refills have been done if requested.  Please have the pharmacy call with any other refills you may need.  Please keep your appointments with your specialists as you may have planned  Please go to the XRAY Department in the Basement (go straight as you get off the elevator) for the x-ray testing  You will be contacted by phone if any changes need to be made immediately.  Otherwise, you will receive a letter about your results with an explanation, but please check with MyChart first.  Please remember to sign up for MyChart if you have not done so, as this will be important to you in the future with finding out test results, communicating by private email, and scheduling acute appointments online when needed.  

## 2017-09-14 ENCOUNTER — Telehealth: Payer: Self-pay

## 2017-09-14 NOTE — Telephone Encounter (Signed)
-----   Message from Corwin LevinsJames W [name], MD sent at 09/12/2017  5:16 PM EST ----- Left message on MyChart, pt to cont same tx except  The test results show that your current treatment is OK as the right lung pneumonia has cleared up, but now there is a nodule to the left lung, and the cuase is not clear.  We should check a CT scan of the chest to look into this further, and see where to go from there.  You should hear from the office as well.    Mauricia Mertens to please inform pt, I will do order for CT

## 2017-09-14 NOTE — Telephone Encounter (Signed)
Pt has been informed and expressed understanding.  

## 2017-09-15 ENCOUNTER — Encounter: Payer: Self-pay | Admitting: Internal Medicine

## 2017-09-15 DIAGNOSIS — J181 Lobar pneumonia, unspecified organism: Secondary | ICD-10-CM

## 2017-09-15 DIAGNOSIS — J189 Pneumonia, unspecified organism: Secondary | ICD-10-CM | POA: Insufficient documentation

## 2017-09-15 NOTE — Assessment & Plan Note (Signed)
stable overall by history and exam, recent data reviewed with pt, and pt to continue medical treatment as before,  to f/u any worsening symptoms or concerns Lab Results  Component Value Date   HGBA1C 5.6 11/06/2015

## 2017-09-15 NOTE — Assessment & Plan Note (Signed)
Resolved, no further steroid tx needed

## 2017-09-15 NOTE — Assessment & Plan Note (Signed)
Clinically resolved, no further antibiotic, for f/u cxr to make sure radiographically resolved, consider CT for abnormality

## 2017-09-22 ENCOUNTER — Inpatient Hospital Stay: Admission: RE | Admit: 2017-09-22 | Payer: PPO | Source: Ambulatory Visit

## 2017-09-25 ENCOUNTER — Ambulatory Visit (INDEPENDENT_AMBULATORY_CARE_PROVIDER_SITE_OTHER)
Admission: RE | Admit: 2017-09-25 | Discharge: 2017-09-25 | Disposition: A | Discharge status: Home / Self Care | Payer: PPO | Source: Ambulatory Visit | Attending: Internal Medicine | Admitting: Internal Medicine

## 2017-09-25 DIAGNOSIS — R918 Other nonspecific abnormal finding of lung field: Secondary | ICD-10-CM | POA: Diagnosis not present

## 2017-09-25 DIAGNOSIS — R911 Solitary pulmonary nodule: Secondary | ICD-10-CM

## 2017-09-25 MED ORDER — IOPAMIDOL (ISOVUE-300) INJECTION 61%
80.0000 mL | Freq: Once | INTRAVENOUS | Status: AC | PRN
  Administered 2017-09-25: 80 mL via INTRAVENOUS

## 2017-09-27 ENCOUNTER — Telehealth: Payer: Self-pay | Admitting: Internal Medicine

## 2017-09-27 ENCOUNTER — Encounter: Payer: Self-pay | Admitting: Internal Medicine

## 2017-09-27 DIAGNOSIS — I7121 Aneurysm of the ascending aorta, without rupture: Secondary | ICD-10-CM | POA: Insufficient documentation

## 2017-09-27 DIAGNOSIS — I712 Thoracic aortic aneurysm, without rupture: Secondary | ICD-10-CM | POA: Insufficient documentation

## 2017-09-27 NOTE — Telephone Encounter (Signed)
Done

## 2017-09-27 NOTE — Telephone Encounter (Signed)
The letter to be printed is always under the "letters" tab and you would just normally look for the most recent one done and print off, thanks

## 2017-09-27 NOTE — Telephone Encounter (Signed)
-----   Message from Roney MansShirron Gay, [place] sent at 09/27/2017  1:21 PM EDT ----- I didn't receive a letter for this patient, please route to me. Thanks.    ----- Message ----- From: Corwin LevinsJohn, Sora Olivo W, MD Sent: 09/27/2017   1:06 PM To: Roney MansShirron Gay, CMA  shirron to also send signed letter, thanks

## 2017-11-09 ENCOUNTER — Encounter: Payer: PPO | Admitting: Internal Medicine

## 2017-11-15 ENCOUNTER — Other Ambulatory Visit (INDEPENDENT_AMBULATORY_CARE_PROVIDER_SITE_OTHER): Payer: PPO

## 2017-11-15 ENCOUNTER — Encounter: Payer: Self-pay | Admitting: Internal Medicine

## 2017-11-15 ENCOUNTER — Ambulatory Visit (INDEPENDENT_AMBULATORY_CARE_PROVIDER_SITE_OTHER): Payer: PPO | Admitting: Internal Medicine

## 2017-11-15 VITALS — BP 122/76 | HR 72 | Temp 97.8°F | Ht 63.0 in | Wt 115.0 lb

## 2017-11-15 DIAGNOSIS — R7302 Impaired glucose tolerance (oral): Secondary | ICD-10-CM | POA: Diagnosis not present

## 2017-11-15 DIAGNOSIS — Z0000 Encounter for general adult medical examination without abnormal findings: Secondary | ICD-10-CM

## 2017-11-15 LAB — URINALYSIS, ROUTINE W REFLEX MICROSCOPIC
BILIRUBIN URINE: NEGATIVE
Hgb urine dipstick: NEGATIVE
KETONES UR: NEGATIVE
NITRITE: NEGATIVE
PH: 6.5 (ref 5.0–8.0)
RBC / HPF: NONE SEEN (ref 0–?)
Specific Gravity, Urine: 1.015 (ref 1.000–1.030)
TOTAL PROTEIN, URINE-UPE24: NEGATIVE
URINE GLUCOSE: NEGATIVE
UROBILINOGEN UA: 0.2 (ref 0.0–1.0)

## 2017-11-15 LAB — BASIC METABOLIC PANEL
BUN: 13 mg/dL (ref 6–23)
CALCIUM: 9.9 mg/dL (ref 8.4–10.5)
CHLORIDE: 96 meq/L (ref 96–112)
CO2: 29 meq/L (ref 19–32)
CREATININE: 0.75 mg/dL (ref 0.40–1.20)
GFR: 77.34 mL/min (ref >60.00)
GLUCOSE: 100 mg/dL — AB (ref 70–99)
Potassium: 4.4 mEq/L (ref 3.5–5.1)
Sodium: 135 mEq/L (ref 135–145)

## 2017-11-15 LAB — CBC WITH DIFFERENTIAL/PLATELET
BASOS PCT: 0.5 % (ref 0.0–3.0)
Basophils Absolute: 0 10*3/uL (ref 0.0–0.1)
EOS ABS: 0 10*3/uL (ref 0.0–0.7)
Eosinophils Relative: 0.4 % (ref 0.0–5.0)
HEMATOCRIT: 36.7 % (ref 36.0–46.0)
Hemoglobin: 12.9 g/dL (ref 12.0–15.0)
LYMPHS PCT: 21 % (ref 12.0–46.0)
Lymphs Abs: 1.7 10*3/uL (ref 0.7–4.0)
MCHC: 35 g/dL (ref 30.0–36.0)
MCV: 92.4 fl (ref 78.0–100.0)
MONO ABS: 0.6 10*3/uL (ref 0.1–1.0)
Monocytes Relative: 7.4 % (ref 3.0–12.0)
NEUTROS PCT: 70.7 % (ref 43.0–77.0)
Neutro Abs: 5.6 10*3/uL (ref 1.4–7.7)
PLATELETS: 283 10*3/uL (ref 150.0–400.0)
RBC: 3.97 Mil/uL (ref 3.87–5.11)
RDW: 12.8 % (ref 11.5–15.5)
WBC: 7.9 10*3/uL (ref 4.0–10.5)

## 2017-11-15 LAB — HEPATIC FUNCTION PANEL
ALK PHOS: 63 U/L (ref 39–117)
ALT: 21 U/L (ref 0–35)
AST: 26 U/L (ref 0–37)
Albumin: 4.4 g/dL (ref 3.5–5.2)
BILIRUBIN DIRECT: 0.1 mg/dL (ref 0.0–0.3)
Total Bilirubin: 0.9 mg/dL (ref 0.2–1.2)
Total Protein: 7.3 g/dL (ref 6.0–8.3)

## 2017-11-15 LAB — LIPID PANEL
Cholesterol: 141 mg/dL (ref 0–200)
HDL: 59.9 mg/dL (ref >39.00)
LDL CALC: 64 mg/dL (ref 0–99)
NonHDL: 80.86
Total CHOL/HDL Ratio: 2
Triglycerides: 85 mg/dL (ref 0.0–149.0)
VLDL: 17 mg/dL (ref 0.0–40.0)

## 2017-11-15 LAB — HEMOGLOBIN A1C: Hgb A1c MFr Bld: 5.3 % (ref 4.6–6.5)

## 2017-11-15 LAB — TSH: TSH: 2.39 u[IU]/mL (ref 0.35–4.50)

## 2017-11-15 MED ORDER — LISINOPRIL-HYDROCHLOROTHIAZIDE 20-12.5 MG PO TABS: 1.0000 | ORAL_TABLET | Freq: Every day | ORAL | 3 refills | Status: DC

## 2017-11-15 MED ORDER — ROSUVASTATIN CALCIUM 40 MG PO TABS: ORAL_TABLET | ORAL | 3 refills | Status: DC

## 2017-11-15 NOTE — Assessment & Plan Note (Signed)
stable overall by history and exam, recent data reviewed with pt, and pt to continue medical treatment as before,  to f/u any worsening symptoms or concerns, for f/u a1c today 

## 2017-11-15 NOTE — Patient Instructions (Signed)

## 2017-11-15 NOTE — Progress Notes (Addendum)
Subjective:    Patient ID: Gloria [name], female    DOB: [birth year], 84 y.o.   MRN: [id number]  HPI  Here for wellness and f/u;  Overall doing ok;  Pt denies Chest pain, worsening SOB, DOE, wheezing, orthopnea, PND, worsening LE edema, palpitations, dizziness or syncope.  Pt denies neurological change such as new headache, facial or extremity weakness.  Pt denies polydipsia, polyuria, or low sugar symptoms. Pt states overall good compliance with treatment and medications, good tolerability, and has been trying to follow appropriate diet.  Pt denies worsening depressive symptoms, suicidal ideation or panic. No fever, night sweats, wt loss, loss of appetite, or other constitutional symptoms.  Pt states good ability with ADL's, has low fall risk, home safety reviewed and adequate, no other significant changes in hearing or vision, and very active with exercise for age. Spry today.   Declines DXA, has known stable compression fx by CT incidental thoracolumbar area.  No other new complaints or interval hx Past Medical History:  Diagnosis Date  . GERD 04/10/2007   Qualifier: Diagnosis of  By: Maris Berger   . HYPERLIPIDEMIA 04/13/2007   Qualifier: Diagnosis of  By: Jonny Ruiz MD, Len Blalock   . HYPERTENSION 04/10/2007   Qualifier: Diagnosis of  By: Maris Berger   . Impaired glucose tolerance 09/17/2011  . OSTEOPOROSIS 04/10/2007   Qualifier: Diagnosis of  By: Maris Berger   . Unspecified vitamin D deficiency 08/26/2009   Centricity Description: UNSPECIFIED VITAMIN D DEFICIENCY Qualifier: Diagnosis of  By: Jonny Ruiz MD, Len Blalock  Centricity Description: VITAMIN D DEFICIENCY Qualifier: Diagnosis of  By: Jonny Ruiz MD, Len Blalock    Past Surgical History:  Procedure Laterality Date  . CATARACT EXTRACTION      reports that she has never smoked. She has never used smokeless tobacco. She reports that she does not drink alcohol or use drugs. family history includes Heart disease in her father;  Stroke in her sister. No Known Allergies Current Outpatient Medications on File Prior to Visit  Medication Sig Dispense Refill  . aspirin 81 MG EC tablet Take 1 tablet (81 mg total) by mouth daily. Swallow whole. 30 tablet 12  . diltiazem (CARDIZEM CD) 240 MG 24 hr capsule Take 1 capsule (240 mg total) by mouth daily. 90 capsule 3  . ondansetron (ZOFRAN) 4 MG tablet Take 1 tablet (4 mg total) by mouth every 8 (eight) hours as needed for nausea or vomiting. 20 tablet 1  . potassium chloride (KLOR-CON M10) 10 MEQ tablet 2 tab by mouth daily for 3 days 6 tablet 0   No current facility-administered medications on file prior to visit.    Review of Systems Constitutional: Negative for other unusual diaphoresis, sweats, appetite or weight changes HENT: Negative for other worsening hearing loss, ear pain, facial swelling, mouth sores or neck stiffness.   Eyes: Negative for other worsening pain, redness or other visual disturbance.  Respiratory: Negative for other stridor or swelling Cardiovascular: Negative for other palpitations or other chest pain  Gastrointestinal: Negative for worsening diarrhea or loose stools, blood in stool, distention or other pain Genitourinary: Negative for hematuria, flank pain or other change in urine volume.  Musculoskeletal: Negative for myalgias or other joint swelling.  Skin: Negative for other color change, or other wound or worsening drainage.  Neurological: Negative for other syncope or numbness. Hematological: Negative for other adenopathy or swelling Psychiatric/Behavioral: Negative for hallucinations, other worsening agitation, SI, self-injury, or new decreased concentration All other system  neg per pt    Objective:   Physical Exam BP 122/76   Pulse 72   Temp 97.8 F (36.6 C) (Oral)   Ht  (1.6 m)   Wt 115 lb (52.2 kg)   SpO2 97%   BMI 20.37 kg/m  VS noted,  Constitutional: Pt is oriented to person, place, and time. Appears well-developed and  well-nourished, in no significant distress and comfortable Head: Normocephalic and atraumatic  Eyes: Conjunctivae and EOM are normal. Pupils are equal, round, and reactive to light Right Ear: External ear normal without discharge Left Ear: External ear normal without discharge Nose: Nose without discharge or deformity Mouth/Throat: Oropharynx is without other ulcerations and moist  Neck: Normal range of motion. Neck supple. No JVD present. No tracheal deviation present or significant neck LA or mass Cardiovascular: Normal rate, regular rhythm, normal heart sounds and intact distal pulses.   Pulmonary/Chest: WOB normal and breath sounds without rales or wheezing  Abdominal: Soft. Bowel sounds are normal. NT. No HSM  Musculoskeletal: Normal range of motion. Exhibits no edema Lymphadenopathy: Has no other cervical adenopathy.  Neurological: Pt is alert and oriented to person, place, and time. Pt has normal reflexes. No cranial nerve deficit. Motor grossly intact, Gait intact Skin: Skin is warm and dry. No rash noted or new ulcerations Psychiatric:  Has normal mood and affect. Behavior is normal without agitation No other exam findings    Assessment & Plan:

## 2017-11-15 NOTE — Assessment & Plan Note (Signed)

## 2018-05-01 IMAGING — DX DG CHEST 2V
2 series · 2 of 2 positions shown · non-contrast
Comparison: None.

CLINICAL DATA: Fever, cough.

EXAM:
CHEST  2 VIEW

[chest pa]
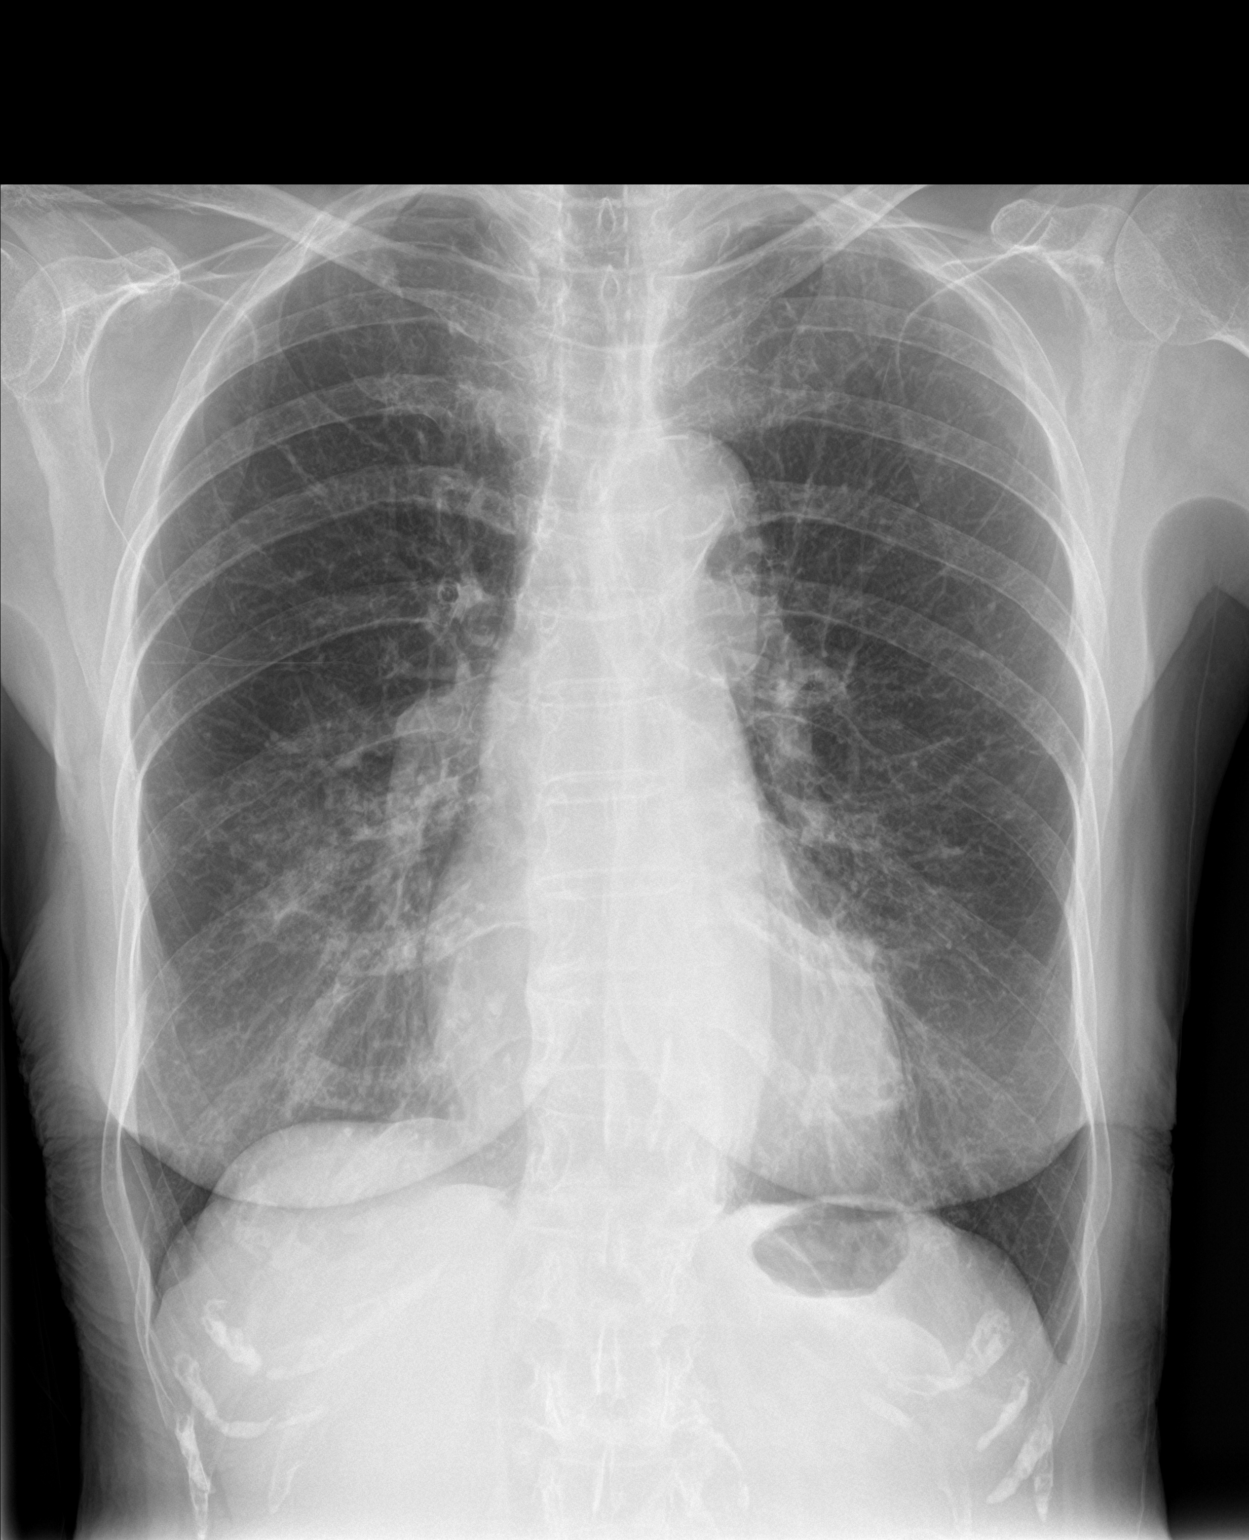

[chest lat]
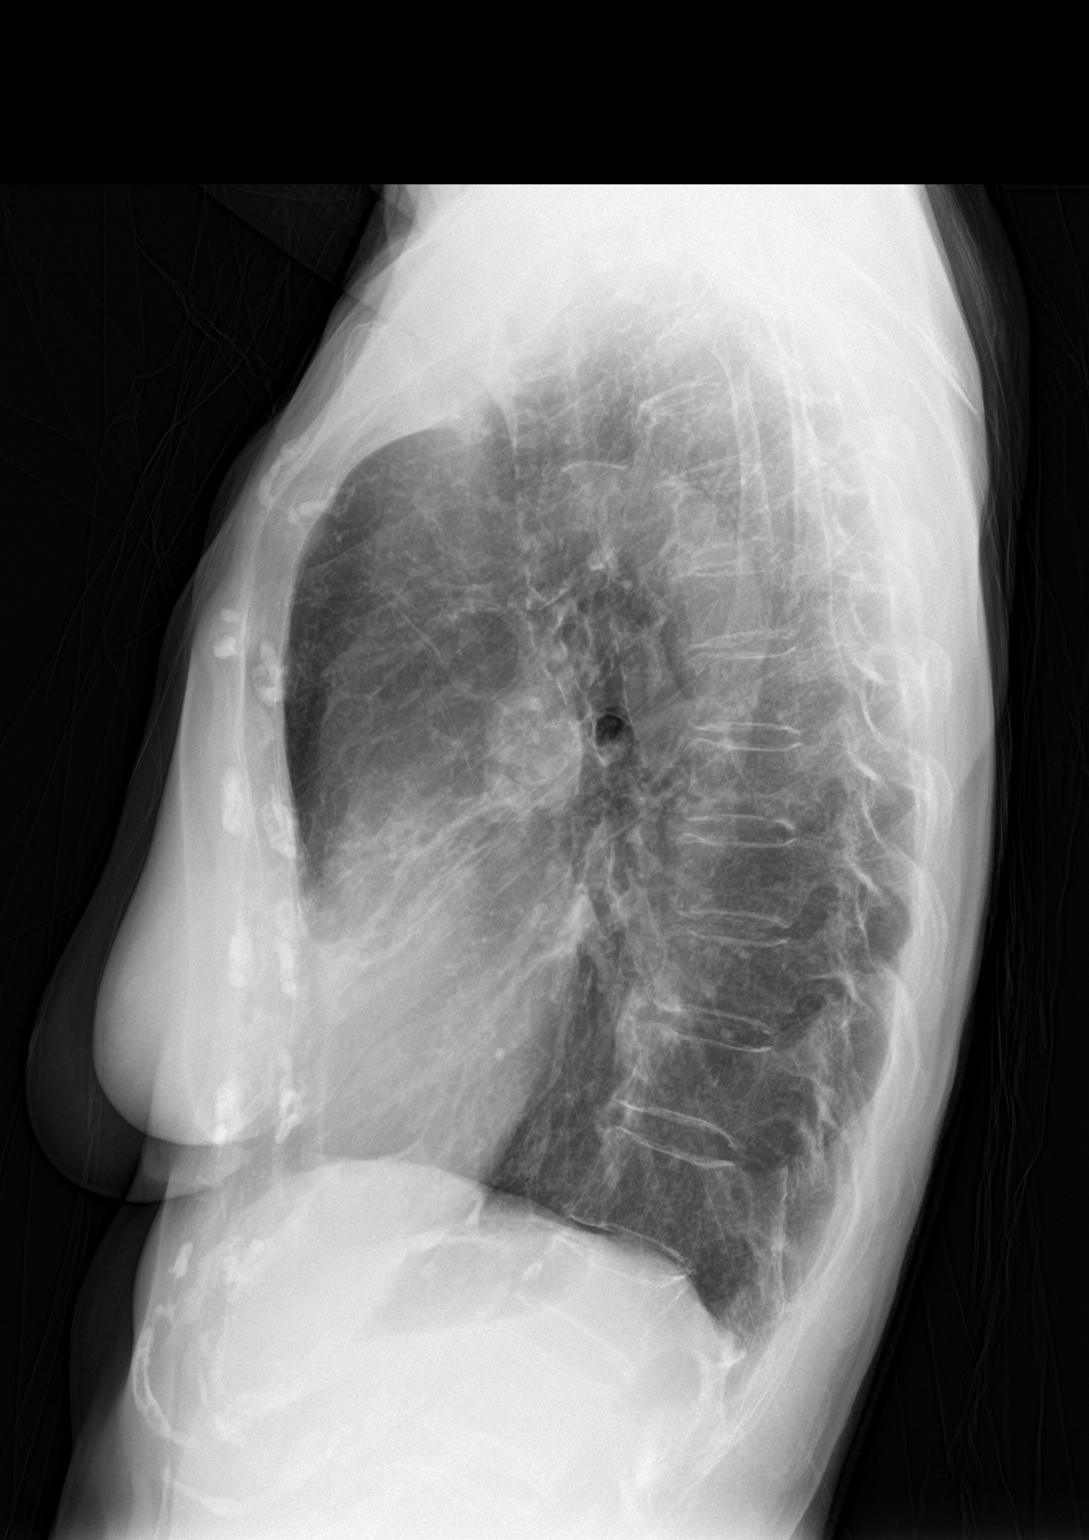

[2 of 2 positions shown; findings below may reference images not displayed]

FINDINGS: The heart size and mediastinal contours are within normal limits. No
pneumothorax or pleural effusion is noted. Atherosclerosis of
thoracic aorta is noted. Left lung is clear. Mild right middle lobe
opacity is noted which may represent pneumonia. The visualized
skeletal structures are unremarkable.
IMPRESSION: Possible mild right middle lobe pneumonia. Followup PA and lateral
chest X-ray is recommended in 3-4 weeks following trial of
antibiotic therapy to ensure resolution and exclude underlying
malignancy.

## 2018-11-21 ENCOUNTER — Encounter: Payer: PPO | Admitting: Internal Medicine

## 2019-01-11 ENCOUNTER — Other Ambulatory Visit: Payer: Self-pay | Admitting: Internal Medicine

## 2019-01-16 ENCOUNTER — Telehealth: Payer: Self-pay | Admitting: Internal Medicine

## 2019-01-16 MED ORDER — LISINOPRIL-HYDROCHLOROTHIAZIDE 20-12.5 MG PO TABS: 1.0000 | ORAL_TABLET | Freq: Every day | ORAL | 0 refills | Status: DC

## 2019-01-16 NOTE — Telephone Encounter (Signed)
Refill done per pt request  Please ask pt to make ROV or virtual if able as last seen may 2019  If she just cant or wont come due to the pandemic, let me know

## 2019-02-01 ENCOUNTER — Other Ambulatory Visit: Payer: Self-pay | Admitting: Internal Medicine

## 2019-02-08 ENCOUNTER — Other Ambulatory Visit: Payer: Self-pay

## 2019-08-18 ENCOUNTER — Telehealth: Payer: Self-pay | Admitting: Internal Medicine

## 2019-08-18 MED ORDER — LISINOPRIL-HYDROCHLOROTHIAZIDE 20-12.5 MG PO TABS: 1.0000 | ORAL_TABLET | Freq: Every day | ORAL | 0 refills | Status: DC

## 2019-08-18 MED ORDER — ROSUVASTATIN CALCIUM 40 MG PO TABS: ORAL_TABLET | ORAL | 0 refills | Status: DC

## 2019-08-18 NOTE — Telephone Encounter (Signed)
Pt last seen may 2019, we have kept the refills going despite the office refill policy out of pandemic courtesy  I refilled her meds as requested for one mo only  Please ask pt to make ROV for further refills as we really cant keep refilling the meds without a checkup in the office

## 2019-10-17 ENCOUNTER — Other Ambulatory Visit: Payer: Self-pay | Admitting: Internal Medicine

## 2019-10-18 ENCOUNTER — Telehealth: Payer: Self-pay | Admitting: Internal Medicine

## 2019-10-18 ENCOUNTER — Telehealth: Payer: Self-pay

## 2019-10-18 DIAGNOSIS — E611 Iron deficiency: Secondary | ICD-10-CM

## 2019-10-18 DIAGNOSIS — R739 Hyperglycemia, unspecified: Secondary | ICD-10-CM

## 2019-10-18 DIAGNOSIS — E559 Vitamin D deficiency, unspecified: Secondary | ICD-10-CM

## 2019-10-18 DIAGNOSIS — Z0000 Encounter for general adult medical examination without abnormal findings: Secondary | ICD-10-CM

## 2019-10-18 DIAGNOSIS — E538 Deficiency of other specified B group vitamins: Secondary | ICD-10-CM

## 2019-10-18 MED ORDER — ROSUVASTATIN CALCIUM 40 MG PO TABS: ORAL_TABLET | ORAL | 0 refills | Status: DC

## 2019-10-18 MED ORDER — LISINOPRIL-HYDROCHLOROTHIAZIDE 20-12.5 MG PO TABS: 1.0000 | ORAL_TABLET | Freq: Every day | ORAL | 0 refills | Status: DC

## 2019-10-18 NOTE — Telephone Encounter (Signed)
Per office policy sent 30 day to local pharmacy until appt.../lmb  

## 2019-10-18 NOTE — Telephone Encounter (Signed)
New message:   1.Medication Requested: lisinopril-hydrochlorothiazide (ZESTORETIC) 20-12.5 MG tablet rosuvastatin (CRESTOR) 40 MG tablet 2. Pharmacy (Name, Street, [place]): [facility] - [place], [place] - [address] 3. On Med List: Yes  4. Last Visit with PCP: 11/15/17  5. Next visit date with PCP:10/30/19   Agent: Please be advised that RX refills may take up to 3 business days. We ask that you follow-up with your pharmacy.

## 2019-10-18 NOTE — Telephone Encounter (Signed)
New message    The patient is asking for lab work before appt on 4.21.21

## 2019-10-21 NOTE — Telephone Encounter (Signed)
Ok orders done 

## 2019-10-22 NOTE — Telephone Encounter (Signed)
Patient is schedule for labs on 4/15

## 2019-10-24 ENCOUNTER — Other Ambulatory Visit: Payer: Self-pay

## 2019-10-24 ENCOUNTER — Other Ambulatory Visit (INDEPENDENT_AMBULATORY_CARE_PROVIDER_SITE_OTHER): Payer: PPO

## 2019-10-24 DIAGNOSIS — R739 Hyperglycemia, unspecified: Secondary | ICD-10-CM | POA: Diagnosis not present

## 2019-10-24 DIAGNOSIS — E559 Vitamin D deficiency, unspecified: Secondary | ICD-10-CM

## 2019-10-24 DIAGNOSIS — Z0000 Encounter for general adult medical examination without abnormal findings: Secondary | ICD-10-CM

## 2019-10-24 DIAGNOSIS — E611 Iron deficiency: Secondary | ICD-10-CM | POA: Diagnosis not present

## 2019-10-24 DIAGNOSIS — E538 Deficiency of other specified B group vitamins: Secondary | ICD-10-CM | POA: Diagnosis not present

## 2019-10-24 LAB — BASIC METABOLIC PANEL WITH GFR
BUN: 12 mg/dL (ref 6–23)
CO2: 32 meq/L (ref 19–32)
Calcium: 9.8 mg/dL (ref 8.4–10.5)
Chloride: 98 meq/L (ref 96–112)
Creatinine, Ser: 0.75 mg/dL (ref 0.40–1.20)
GFR: 72.45 mL/min
Glucose, Bld: 108 mg/dL — ABNORMAL HIGH (ref 70–99)
Potassium: 4 meq/L (ref 3.5–5.1)
Sodium: 136 meq/L (ref 135–145)

## 2019-10-24 LAB — IBC PANEL
Iron: 96 ug/dL (ref 42–145)
Saturation Ratios: 29.1 % (ref 20.0–50.0)
Transferrin: 236 mg/dL (ref 212.0–360.0)

## 2019-10-24 LAB — CBC WITH DIFFERENTIAL/PLATELET
Basophils Absolute: 0 10*3/uL (ref 0.0–0.1)
Basophils Relative: 0.5 % (ref 0.0–3.0)
Eosinophils Absolute: 0 10*3/uL (ref 0.0–0.7)
Eosinophils Relative: 0.7 % (ref 0.0–5.0)
HCT: 39.9 % (ref 36.0–46.0)
Hemoglobin: 13.9 g/dL (ref 12.0–15.0)
Lymphocytes Relative: 25.2 % (ref 12.0–46.0)
Lymphs Abs: 1.7 10*3/uL (ref 0.7–4.0)
MCHC: 34.9 g/dL (ref 30.0–36.0)
MCV: 90.6 fl (ref 78.0–100.0)
Monocytes Absolute: 0.6 10*3/uL (ref 0.1–1.0)
Monocytes Relative: 9.1 % (ref 3.0–12.0)
Neutro Abs: 4.4 10*3/uL (ref 1.4–7.7)
Neutrophils Relative %: 64.5 % (ref 43.0–77.0)
Platelets: 234 10*3/uL (ref 150.0–400.0)
RBC: 4.4 Mil/uL (ref 3.87–5.11)
RDW: 12.6 % (ref 11.5–15.5)
WBC: 6.9 10*3/uL (ref 4.0–10.5)

## 2019-10-24 LAB — URINALYSIS, ROUTINE W REFLEX MICROSCOPIC
Bilirubin Urine: NEGATIVE
Hgb urine dipstick: NEGATIVE
Ketones, ur: NEGATIVE
Leukocytes,Ua: NEGATIVE
Nitrite: NEGATIVE
RBC / HPF: NONE SEEN (ref 0–?)
Specific Gravity, Urine: 1.015 (ref 1.000–1.030)
Total Protein, Urine: NEGATIVE
Urine Glucose: NEGATIVE
Urobilinogen, UA: 0.2 (ref 0.0–1.0)
pH: 7 (ref 5.0–8.0)

## 2019-10-24 LAB — HEPATIC FUNCTION PANEL
ALT: 21 U/L (ref 0–35)
AST: 26 U/L (ref 0–37)
Albumin: 4.5 g/dL (ref 3.5–5.2)
Alkaline Phosphatase: 69 U/L (ref 39–117)
Bilirubin, Direct: 0.3 mg/dL (ref 0.0–0.3)
Total Bilirubin: 1.5 mg/dL — ABNORMAL HIGH (ref 0.2–1.2)
Total Protein: 7.2 g/dL (ref 6.0–8.3)

## 2019-10-24 LAB — VITAMIN D 25 HYDROXY (VIT D DEFICIENCY, FRACTURES): VITD: 22.46 ng/mL — ABNORMAL LOW (ref 30.00–100.00)

## 2019-10-24 LAB — LIPID PANEL
Cholesterol: 143 mg/dL (ref 0–200)
HDL: 50.3 mg/dL (ref >39.00)
LDL Cholesterol: 76 mg/dL (ref 0–99)
NonHDL: 92.5
Total CHOL/HDL Ratio: 3
Triglycerides: 85 mg/dL (ref 0.0–149.0)
VLDL: 17 mg/dL (ref 0.0–40.0)

## 2019-10-24 LAB — VITAMIN B12: Vitamin B-12: 347 pg/mL (ref 211–911)

## 2019-10-24 LAB — TSH: TSH: 2.79 u[IU]/mL (ref 0.35–4.50)

## 2019-10-24 LAB — HEMOGLOBIN A1C: Hgb A1c MFr Bld: 5.5 % (ref 4.6–6.5)

## 2019-10-30 ENCOUNTER — Ambulatory Visit (INDEPENDENT_AMBULATORY_CARE_PROVIDER_SITE_OTHER): Payer: PPO | Admitting: Internal Medicine

## 2019-10-30 ENCOUNTER — Encounter: Payer: Self-pay | Admitting: Internal Medicine

## 2019-10-30 ENCOUNTER — Other Ambulatory Visit: Payer: Self-pay

## 2019-10-30 VITALS — BP 158/100 | HR 76 | Temp 98.2°F | Ht 63.0 in | Wt 135.0 lb

## 2019-10-30 DIAGNOSIS — I10 Essential (primary) hypertension: Secondary | ICD-10-CM | POA: Diagnosis not present

## 2019-10-30 DIAGNOSIS — R030 Elevated blood-pressure reading, without diagnosis of hypertension: Secondary | ICD-10-CM

## 2019-10-30 DIAGNOSIS — E559 Vitamin D deficiency, unspecified: Secondary | ICD-10-CM | POA: Diagnosis not present

## 2019-10-30 DIAGNOSIS — R7302 Impaired glucose tolerance (oral): Secondary | ICD-10-CM

## 2019-10-30 DIAGNOSIS — Z0000 Encounter for general adult medical examination without abnormal findings: Secondary | ICD-10-CM | POA: Diagnosis not present

## 2019-10-30 MED ORDER — VITAMIN D (ERGOCALCIFEROL) 1.25 MG (50000 UNIT) PO CAPS: 50000.0000 [IU] | ORAL_CAPSULE | ORAL | 0 refills | Status: DC

## 2019-10-30 NOTE — Assessment & Plan Note (Signed)

## 2019-10-30 NOTE — Assessment & Plan Note (Signed)
Cont oral replacement 

## 2019-10-30 NOTE — Patient Instructions (Signed)
Please take Vitamin D 29562 units weekly for 12 weeks, then plan to change to OTC Vitamin D3 at 2000 units per day, indefinitely.  Please continue to watch your Blood Pressure at home with the goal of being less than 140/90  Please continue all other medications as before, and refills have been done if requested.  Please have the pharmacy call with any other refills you may need.  Please continue your efforts at being more active, low cholesterol diet, and weight control.  You are otherwise up to date with prevention measures today.  Please keep your appointments with your specialists as you may have planned  Please make an Appointment to return in 6 months, or sooner if needed

## 2019-10-30 NOTE — Assessment & Plan Note (Signed)
To f/u bp at home daily for 2 wks and call for ave . 140/90

## 2019-10-30 NOTE — Progress Notes (Signed)
Subjective:    Patient ID: Gloria [name], female    DOB: [birth year], 86 y.o.   MRN: [id number]  HPI  Here for wellness and f/u;  Overall doing ok;  Pt denies Chest pain, worsening SOB, DOE, wheezing, orthopnea, PND, worsening LE edema, palpitations, dizziness or syncope.  Pt denies neurological change such as new headache, facial or extremity weakness.  Pt denies polydipsia, polyuria, or low sugar symptoms. Pt states overall good compliance with treatment and medications, good tolerability, and has been trying to follow appropriate diet.  Pt denies worsening depressive symptoms, suicidal ideation or panic. No fever, night sweats, wt loss, loss of appetite, or other constitutional symptoms.  Pt states good ability with ADL's, has low fall risk, home safety reviewed and adequate, no other significant changes in hearing or vision, and remains quite active with exercise BP Readings from Last 3 Encounters:  10/30/19 (!) 158/100  11/15/17 122/76  09/12/17 116/78   Past Medical History:  Diagnosis Date  . GERD 04/10/2007   Qualifier: Diagnosis of  By: Elveria Royals   . HYPERLIPIDEMIA 04/13/2007   Qualifier: Diagnosis of  By: Jenny Reichmann MD, Hunt Oris   . HYPERTENSION 04/10/2007   Qualifier: Diagnosis of  By: Elveria Royals   . Impaired glucose tolerance 09/17/2011  . OSTEOPOROSIS 04/10/2007   Qualifier: Diagnosis of  By: Elveria Royals   . Unspecified vitamin D deficiency 08/26/2009   Centricity Description: UNSPECIFIED VITAMIN D DEFICIENCY Qualifier: Diagnosis of  By: Jenny Reichmann MD, Hunt Oris  Centricity Description: VITAMIN D DEFICIENCY Qualifier: Diagnosis of  By: Jenny Reichmann MD, Hunt Oris    Past Surgical History:  Procedure Laterality Date  . CATARACT EXTRACTION      reports that she has never smoked. She has never used smokeless tobacco. She reports that she does not drink alcohol or use drugs. family history includes Heart disease in her father; Stroke in her sister. No Known  Allergies Current Outpatient Medications on File Prior to Visit  Medication Sig Dispense Refill  . aspirin 81 MG EC tablet Take 1 tablet (81 mg total) by mouth daily. Swallow whole. 30 tablet 12  . diltiazem (CARDIZEM CD) 240 MG 24 hr capsule Take 1 capsule (240 mg total) by mouth daily. 90 capsule 3  . lisinopril-hydrochlorothiazide (ZESTORETIC) 20-12.5 MG tablet Take 1 tablet by mouth daily. Must keep appt for future refills 30 tablet 0  . ondansetron (ZOFRAN) 4 MG tablet Take 1 tablet (4 mg total) by mouth every 8 (eight) hours as needed for nausea or vomiting. 20 tablet 1  . potassium chloride (KLOR-CON M10) 10 MEQ tablet 2 tab by mouth daily for 3 days 6 tablet 0  . rosuvastatin (CRESTOR) 40 MG tablet TAKE ONE TABLET BY MOUTH DAILY Must keep appt for future refills 30 tablet 0   No current facility-administered medications on file prior to visit.   Review of Systems All otherwise neg per pt     Objective:   Physical Exam BP (!) 158/100 (BP Location: Left Arm, Patient Position: Sitting, Cuff Size: Small)   Pulse 76   Temp 98.2 F (36.8 C) (Oral)   Ht 5\' 3"  (1.6 m)   Wt 135 lb (61.2 kg)   SpO2 95%   BMI 23.91 kg/m  VS noted,  Constitutional: Pt appears in NAD HENT: Head: NCAT.  Right Ear: External ear normal.  Left Ear: External ear normal.  Eyes: . Pupils are equal, round, and reactive to light. Conjunctivae and EOM are normal  Nose: without d/c or deformity Neck: Neck supple. Gross normal ROM Cardiovascular: Normal rate and regular rhythm.   Pulmonary/Chest: Effort normal and breath sounds without rales or wheezing.  Abd:  Soft, NT, ND, + BS, no organomegaly Neurological: Pt is alert. At baseline orientation, motor grossly intact Skin: Skin is warm. No rashes, other new lesions, no LE edema Psychiatric: Pt behavior is normal without agitation  All otherwise neg per pt Lab Results  Component Value Date   WBC 6.9 10/24/2019   HGB 13.9 10/24/2019   HCT 39.9 10/24/2019    PLT 234.0 10/24/2019   GLUCOSE 108 (H) 10/24/2019   CHOL 143 10/24/2019   TRIG 85.0 10/24/2019   HDL 50.30 10/24/2019   LDLCALC 76 10/24/2019   ALT 21 10/24/2019   AST 26 10/24/2019   NA 136 10/24/2019   K 4.0 10/24/2019   CL 98 10/24/2019   CREATININE 0.75 10/24/2019   BUN 12 10/24/2019   CO2 32 10/24/2019   TSH 2.79 10/24/2019   HGBA1C 5.5 10/24/2019      Assessment & Plan:

## 2019-10-30 NOTE — Assessment & Plan Note (Signed)
Uncontrolled today, declines med change, to f/u bp at home daiy for 2 wks, call for ave . 140/90

## 2019-10-30 NOTE — Assessment & Plan Note (Signed)
stable overall by history and exam, recent data reviewed with pt, and pt to continue medical treatment as before,  to f/u any worsening symptoms or concerns  

## 2019-11-08 ENCOUNTER — Telehealth: Payer: Self-pay

## 2019-11-08 MED ORDER — LISINOPRIL-HYDROCHLOROTHIAZIDE 20-12.5 MG PO TABS: 1.0000 | ORAL_TABLET | Freq: Every day | ORAL | 3 refills | Status: DC

## 2019-11-08 NOTE — Telephone Encounter (Signed)
Reviewed chart pt is up-to-date sent refills to [facility].../lmb  

## 2019-11-08 NOTE — Telephone Encounter (Signed)
1.Medication Requested:lisinopril-hydrochlorothiazide (ZESTORETIC) 20-12.5 MG tablet  rosuvastatin (CRESTOR) 40 MG tablet  2. Pharmacy (Name, Street, City):[facility] - [place], [place] - [place] [address]  3. On Med List: Yes   4. Last Visit with PCP: 4.21.21   5. Next visit date with PCP: 10.20.21    Agent: Please be advised that RX refills may take up to 3 business days. We ask that you follow-up with your pharmacy.

## 2020-04-29 ENCOUNTER — Ambulatory Visit: Payer: PPO | Admitting: Internal Medicine

## 2020-05-05 ENCOUNTER — Other Ambulatory Visit: Payer: Self-pay

## 2020-05-06 ENCOUNTER — Encounter: Payer: Self-pay | Admitting: Internal Medicine

## 2020-05-06 ENCOUNTER — Ambulatory Visit (INDEPENDENT_AMBULATORY_CARE_PROVIDER_SITE_OTHER): Payer: PPO

## 2020-05-06 ENCOUNTER — Ambulatory Visit (INDEPENDENT_AMBULATORY_CARE_PROVIDER_SITE_OTHER): Payer: PPO | Admitting: Internal Medicine

## 2020-05-06 ENCOUNTER — Telehealth: Payer: Self-pay | Admitting: Internal Medicine

## 2020-05-06 VITALS — Ht 63.0 in | Wt 127.0 lb

## 2020-05-06 VITALS — BP 124/70 | HR 88 | Temp 98.2°F | Resp 16 | Ht 63.0 in | Wt 127.6 lb

## 2020-05-06 DIAGNOSIS — I10 Essential (primary) hypertension: Secondary | ICD-10-CM

## 2020-05-06 DIAGNOSIS — R7302 Impaired glucose tolerance (oral): Secondary | ICD-10-CM | POA: Diagnosis not present

## 2020-05-06 DIAGNOSIS — E785 Hyperlipidemia, unspecified: Secondary | ICD-10-CM

## 2020-05-06 DIAGNOSIS — E559 Vitamin D deficiency, unspecified: Secondary | ICD-10-CM

## 2020-05-06 DIAGNOSIS — K219 Gastro-esophageal reflux disease without esophagitis: Secondary | ICD-10-CM

## 2020-05-06 DIAGNOSIS — Z0000 Encounter for general adult medical examination without abnormal findings: Secondary | ICD-10-CM | POA: Diagnosis not present

## 2020-05-06 NOTE — Assessment & Plan Note (Addendum)
stable overall by history and exam, recent data reviewed with pt, and pt to continue medical treatment as before,  to f/u any worsening symptoms or concerns  I spent 31 minutes in preparing to see the patient by review of recent labs, imaging and procedures, obtaining and reviewing separately obtained history, communicating with the patient and family or caregiver, ordering medications, tests or procedures, and documenting clinical information in the EHR including the differential Dx, treatment, and any further evaluation and other management of htn, gerd, hld, hyperglycemia, vit d def

## 2020-05-06 NOTE — Progress Notes (Signed)
Subjective:   Gloria [name] is a 86 y.o. female who presents for Medicare Annual (Subsequent) preventive examination.  Review of Systems    No ROS. Medicare Wellness Visit. Additional risk factors are reflected in social history. Cardiac Risk Factors include: advanced age (>58men, >47 women);dyslipidemia;family history of premature cardiovascular disease;hypertension Sleep Patterns: No sleep issues, feels rested on waking and sleeps 8 hours nightly. Home Safety/Smoke Alarms: Feels safe in home; uses home alarm. Smoke alarms in place. Living environment: 1-story home; Lives alone; no needs for DME; good family support system. Seat Belt Safety/Bike Helmet: Wears seat belt.    Objective:    Today's Vitals   05/06/20 1223  BP: 124/70  Pulse: 88  Resp: 16  Temp: 98.2 F (36.8 C)  SpO2: 96%  Weight: 127 lb 9.6 oz (57.9 kg)  Height: 5\' 3"  (1.6 m)   Body mass index is 22.6 kg/m.  Advanced Directives 05/06/2020  Does Patient Have a Medical Advance Directive? Yes  Type of 05/08/2020 of [place];Living will  Does patient want to make changes to medical advance directive? No - Patient declined  Copy of Healthcare Power of Attorney in Chart? No - copy requested    Current Medications (verified) Outpatient Encounter Medications as of 05/06/2020  Medication Sig  . aspirin 81 MG EC tablet Take 1 tablet (81 mg total) by mouth daily. Swallow whole.  . lisinopril-hydrochlorothiazide (ZESTORETIC) 20-12.5 MG tablet Take 1 tablet by mouth daily.  . rosuvastatin (CRESTOR) 40 MG tablet TAKE ONE TABLET BY MOUTH DAILY Must keep appt for future refills  . diltiazem (CARDIZEM CD) 240 MG 24 hr capsule Take 1 capsule (240 mg total) by mouth daily. (Patient not taking: Reported on 05/06/2020)  . ondansetron (ZOFRAN) 4 MG tablet Take 1 tablet (4 mg total) by mouth every 8 (eight) hours as needed for nausea or vomiting. (Patient not taking: Reported on 05/06/2020)  .  potassium chloride (KLOR-CON M10) 10 MEQ tablet 2 tab by mouth daily for 3 days (Patient not taking: Reported on 05/06/2020)  . Vitamin D, Ergocalciferol, (DRISDOL) 1.25 MG (50000 UNIT) CAPS capsule Take 1 capsule (50,000 Units total) by mouth every 7 (seven) days.   No facility-administered encounter medications on file as of 05/06/2020.    Allergies (verified) Patient has no known allergies.   History: Past Medical History:  Diagnosis Date  . GERD 04/10/2007   Qualifier: Diagnosis of  By: 04/12/2007   . HYPERLIPIDEMIA 04/13/2007   Qualifier: Diagnosis of  By: 06/13/2007 MD, Jonny Ruiz   . HYPERTENSION 04/10/2007   Qualifier: Diagnosis of  By: 04/12/2007   . Impaired glucose tolerance 09/17/2011  . OSTEOPOROSIS 04/10/2007   Qualifier: Diagnosis of  By: 04/12/2007   . Unspecified vitamin D deficiency 08/26/2009   Centricity Description: UNSPECIFIED VITAMIN D DEFICIENCY Qualifier: Diagnosis of  By: 08/28/2009 MD, Jonny Ruiz  Centricity Description: VITAMIN D DEFICIENCY Qualifier: Diagnosis of  By: Len Blalock MD, Jonny Ruiz    Past Surgical History:  Procedure Laterality Date  . CATARACT EXTRACTION     Family History  Problem Relation Age of Onset  . Heart disease Father   . Stroke Sister    Social History   Socioeconomic History  . Marital status: Single    Spouse name: Not on file  . Number of children: Not on file  . Years of education: Not on file  . Highest education level: Not on file  Occupational History  .  Not on file  Tobacco Use  . Smoking status: Never Smoker  . Smokeless tobacco: Never Used  Substance and Sexual Activity  . Alcohol use: No  . Drug use: No  . Sexual activity: Not on file  Other Topics Concern  . Not on file  Social History Narrative  . Not on file   Social Determinants of Health   Financial Resource Strain: Low Risk   . Difficulty of Paying Living Expenses: Not hard at all  Food Insecurity: No Food Insecurity  . Worried About  Programme researcher, broadcasting/film/videounning Out of Food in the Last Year: Never true  . Ran Out of Food in the Last Year: Never true  Transportation Needs: No Transportation Needs  . Lack of Transportation (Medical): No  . Lack of Transportation (Non-Medical): No  Physical Activity: Sufficiently Active  . Days of Exercise per Week: 5 days  . Minutes of Exercise per Session: 30 min  Stress: No Stress Concern Present  . Feeling of Stress : Not at all  Social Connections: Moderately Integrated  . Frequency of Communication with Friends and Family: More than three times a week  . Frequency of Social Gatherings with Friends and Family: More than three times a week  . Attends Religious Services: More than 4 times per year  . Active Member of Clubs or Organizations: Yes  . Attends BankerClub or Organization Meetings: More than 4 times per year  . Marital Status: Widowed    Tobacco Counseling Counseling given: Not Answered   Clinical Intake:  Pre-visit preparation completed: Yes  Pain : No/denies pain     BMI - recorded: 22.6 Nutritional Status: BMI of 19-24  Normal Nutritional Risks: None Diabetes: No  How often do you need to have someone help you when you read instructions, pamphlets, or other written materials from your doctor or pharmacy?: 1 - Never What is the last grade level you completed in school?: HSG  Diabetic? no  Interpreter Needed?: No  Information entered by :: Susie CassetteShenika Kayana Thoen, LPN   Activities of Daily Living In your present state of health, do you have any difficulty performing the following activities: 05/06/2020  Hearing? N  Vision? N  Difficulty concentrating or making decisions? N  Walking or climbing stairs? N  Dressing or bathing? N  Doing errands, shopping? N  Preparing Food and eating ? N  Using the Toilet? N  In the past six months, have you accidently leaked urine? N  Do you have problems with loss of bowel control? N  Managing your Medications? N  Managing your Finances? N    Housekeeping or managing your Housekeeping? N  Some recent data might be hidden    Patient Care Team: Corwin LevinsJohn, [name], MD as PCP - General  Indicate any recent Medical Services you may have received from other than [facility] providers in the past year (date may be approximate).     Assessment:   This is a routine wellness examination for Gloria Dashsther.  Hearing/Vision screen No exam data present  Dietary issues and exercise activities discussed: Current Exercise Habits: Home exercise routine, Type of exercise: walking;Other - see comments (yard work,  house work, Catering manageretc.), Time (Minutes): 30, Frequency (Times/Week): 5, Weekly Exercise (Minutes/Week): 150, Intensity: Moderate, Exercise limited by: None identified  Goals   None    Depression Screen PHQ 2/9 Scores 05/06/2020 10/30/2019 11/15/2017 11/08/2016 11/06/2015 11/04/2014 11/01/2013  PHQ - 2 Score 0 0 0 0 0 0 0  PHQ- 9 Score - - 0 - - - -  Fall Risk Fall Risk  05/06/2020 10/30/2019 02/08/2019 11/15/2017 11/08/2016  Falls in the past year? 0 0 (No Data) No No  Comment - - Emmi Telephone Survey: data to providers prior to load - -  Number falls in past yr: 0 0 (No Data) - -  Comment - - Emmi Telephone Survey Actual Response =  - -  Injury with Fall? 0 0 - - -  Risk for fall due to : No Fall Risks No Fall Risks - - -  Follow up Falls evaluation completed Falls evaluation completed - - -    Any stairs in or around the home? No  If so, are there any without handrails? No  Home free of loose throw rugs in walkways, pet beds, electrical cords, etc? Yes  Adequate lighting in your home to reduce risk of falls? Yes   ASSISTIVE DEVICES UTILIZED TO PREVENT FALLS:  Life alert? No  Use of a cane, walker or w/c? No  Grab bars in the bathroom? No  Shower chair or bench in shower? No  Elevated toilet seat or a handicapped toilet? No   TIMED UP AND GO:  Was the test performed? No .  Length of time to ambulate 10 feet: 0 sec.   Gait steady and fast  without use of assistive device  Cognitive Function: Patient is cogitatively intact.        Immunizations Immunization History  Administered Date(s) Administered  . Influenza Whole 05/18/2007  . PFIZER SARS-COV-2 Vaccination 03/09/2020, 03/30/2020  . Pneumococcal Conjugate-13 11/01/2013  . Pneumococcal Polysaccharide-23 07/11/2005  . Td 06/20/2008    TDAP status: Due, Education has been provided regarding the importance of this vaccine. Advised may receive this vaccine at local pharmacy or Health Dept. Aware to provide a copy of the vaccination record if obtained from local pharmacy or Health Dept. Verbalized acceptance and understanding. Flu Vaccine status: Declined, Education has been provided regarding the importance of this vaccine but patient still declined. Advised may receive this vaccine at local pharmacy or Health Dept. Aware to provide a copy of the vaccination record if obtained from local pharmacy or Health Dept. Verbalized acceptance and understanding. Pneumococcal vaccine status: Up to date Covid-19 vaccine status: Completed vaccines  Qualifies for Shingles Vaccine? Yes   Zostavax completed No   Shingrix Completed?: No.    Education has been provided regarding the importance of this vaccine. Patient has been advised to call insurance company to determine out of pocket expense if they have not yet received this vaccine. Advised may also receive vaccine at local pharmacy or Health Dept. Verbalized acceptance and understanding.  Screening Tests Health Maintenance  Topic Date Due  . INFLUENZA VACCINE  02/09/2020  . TETANUS/TDAP  10/29/2020 (Originally 06/20/2018)  . COVID-19 Vaccine  Completed  . PNA vac Low Risk Adult  Completed  . DEXA SCAN  Discontinued    Health Maintenance  Health Maintenance Due  Topic Date Due  . INFLUENZA VACCINE  02/09/2020    Colorectal cancer screening: No longer required.  Mammogram status: No longer required.  Bone Density status:  Never done  Lung Cancer Screening: (Low Dose CT Chest recommended if Age 32-80 years, 30 pack-year currently smoking OR have quit w/in 15years.) does not qualify.   Lung Cancer Screening Referral: no  Additional Screening:  Hepatitis C Screening: does not qualify; Completed no  Vision Screening: Recommended annual ophthalmology exams for early detection of glaucoma and other disorders of the eye. Is the patient up to date  with their annual eye exam?  Yes  Who is the provider or what is the name of the office in which the patient attends annual eye exams? [facility] If pt is not established with a provider, would they like to be referred to a provider to establish care? No .   Dental Screening: Recommended annual dental exams for proper oral hygiene  Community Resource Referral / Chronic Care Management: CRR required this visit?  No   CCM required this visit?  No      Plan:     I have personally reviewed and noted the following in the patient's chart:   . Medical and social history . Use of alcohol, tobacco or illicit drugs  . Current medications and supplements . Functional ability and status . Nutritional status . Physical activity . Advanced directives . List of other physicians . Hospitalizations, surgeries, and ER visits in previous 12 months . Vitals . Screenings to include cognitive, depression, and falls . Referrals and appointments  In addition, I have reviewed and discussed with patient certain preventive protocols, quality metrics, and best practice recommendations. A written personalized care plan for preventive services as well as general preventive health recommendations were provided to patient.     Mickeal Needy, LPN   05/27/3566   Nurse Notes: n/a

## 2020-05-06 NOTE — Patient Instructions (Addendum)
Gloria [name] , Thank you for taking time to come for your Medicare Wellness Visit. I appreciate your ongoing commitment to your health goals. Please review the following plan we discussed and let me know if I can assist you in the future.   Screening recommendations/referrals: Colonoscopy: no repeat due to age Mammogram: no repeat due to age Bone Density: never done Recommended yearly ophthalmology/optometry visit for glaucoma screening and checkup Recommended yearly dental visit for hygiene and checkup  Vaccinations: Influenza vaccine: declined Pneumococcal vaccine: up to date Tdap vaccine: declined Shingles vaccine: never done   Covid-19: up to date  Advanced directives: Please bring a copy of your health care power of attorney and living will to the office at your convenience.  Conditions/risks identified: Yes.  Next appointment: Please schedule your next Medicare Wellness Visit with your Nurse Health Advisor in 1 year by calling [phone].  Preventive Care 84 Years and Older, Female Preventive care refers to lifestyle choices and visits with your health care provider that can promote health and wellness. What does preventive care include?  A yearly physical exam. This is also called an annual well check.  Dental exams once or twice a year.  Routine eye exams. Ask your health care provider how often you should have your eyes checked.  Personal lifestyle choices, including:  Daily care of your teeth and gums.  Regular physical activity.  Eating a healthy diet.  Avoiding tobacco and drug use.  Limiting alcohol use.  Practicing safe sex.  Taking low-dose aspirin every day.  Taking vitamin and mineral supplements as recommended by your health care provider. What happens during an annual well check? The services and screenings done by your health care provider during your annual well check will depend on your age, overall health, lifestyle risk factors, and family  history of disease. Counseling  Your health care provider may ask you questions about your:  Alcohol use.  Tobacco use.  Drug use.  Emotional well-being.  Home and relationship well-being.  Sexual activity.  Eating habits.  History of falls.  Memory and ability to understand (cognition).  Work and work Astronomer.  Reproductive health. Screening  You may have the following tests or measurements:  Height, weight, and BMI.  Blood pressure.  Lipid and cholesterol levels. These may be checked every 5 years, or more frequently if you are over 42 years old.  Skin check.  Lung cancer screening. You may have this screening every year starting at age 69 if you have a 30-pack-year history of smoking and currently smoke or have quit within the past 15 years.  Fecal occult blood test (FOBT) of the stool. You may have this test every year starting at age 62.  Flexible sigmoidoscopy or colonoscopy. You may have a sigmoidoscopy every 5 years or a colonoscopy every 10 years starting at age 38.  Hepatitis C blood test.  Hepatitis B blood test.  Sexually transmitted disease (STD) testing.  Diabetes screening. This is done by checking your blood sugar (glucose) after you have not eaten for a while (fasting). You may have this done every 1-3 years.  Bone density scan. This is done to screen for osteoporosis. You may have this done starting at age 53.  Mammogram. This may be done every 1-2 years. Talk to your health care provider about how often you should have regular mammograms. Talk with your health care provider about your test results, treatment options, and if necessary, the need for more tests. Vaccines  Your health  care provider may recommend certain vaccines, such as:  Influenza vaccine. This is recommended every year.  Tetanus, diphtheria, and acellular pertussis (Tdap, Td) vaccine. You may need a Td booster every 10 years.  Zoster vaccine. You may need this after  age 55.  Pneumococcal 13-valent conjugate (PCV13) vaccine. One dose is recommended after age 23.  Pneumococcal polysaccharide (PPSV23) vaccine. One dose is recommended after age 55. Talk to your health care provider about which screenings and vaccines you need and how often you need them. This information is not intended to replace advice given to you by your health care provider. Make sure you discuss any questions you have with your health care provider. Document Released: 07/24/2015 Document Revised: 03/16/2016 Document Reviewed: 04/28/2015 Elsevier Interactive Patient Education  2017 [place] Prevention in the Home Falls can cause injuries. They can happen to people of all ages. There are many things you can do to make your home safe and to help prevent falls. What can I do on the outside of my home?  Regularly fix the edges of walkways and driveways and fix any cracks.  Remove anything that might make you trip as you walk through a door, such as a raised step or threshold.  Trim any bushes or trees on the path to your home.  Use bright outdoor lighting.  Clear any walking paths of anything that might make someone trip, such as rocks or tools.  Regularly check to see if handrails are loose or broken. Make sure that both sides of any steps have handrails.  Any raised decks and porches should have guardrails on the edges.  Have any leaves, snow, or ice cleared regularly.  Use sand or salt on walking paths during winter.  Clean up any spills in your garage right away. This includes oil or grease spills. What can I do in the bathroom?  Use night lights.  Install grab bars by the toilet and in the tub and shower. Do not use towel bars as grab bars.  Use non-skid mats or decals in the tub or shower.  If you need to sit down in the shower, use a plastic, non-slip stool.  Keep the floor dry. Clean up any water that spills on the floor as soon as it  happens.  Remove soap buildup in the tub or shower regularly.  Attach bath mats securely with double-sided non-slip rug tape.  Do not have throw rugs and other things on the floor that can make you trip. What can I do in the bedroom?  Use night lights.  Make sure that you have a light by your bed that is easy to reach.  Do not use any sheets or blankets that are too big for your bed. They should not hang down onto the floor.  Have a firm chair that has side arms. You can use this for support while you get dressed.  Do not have throw rugs and other things on the floor that can make you trip. What can I do in the kitchen?  Clean up any spills right away.  Avoid walking on wet floors.  Keep items that you use a lot in easy-to-reach places.  If you need to reach something above you, use a strong step stool that has a grab bar.  Keep electrical cords out of the way.  Do not use floor polish or wax that makes floors slippery. If you must use wax, use non-skid floor wax.  Do not have  throw rugs and other things on the floor that can make you trip. What can I do with my stairs?  Do not leave any items on the stairs.  Make sure that there are handrails on both sides of the stairs and use them. Fix handrails that are broken or loose. Make sure that handrails are as long as the stairways.  Check any carpeting to make sure that it is firmly attached to the stairs. Fix any carpet that is loose or worn.  Avoid having throw rugs at the top or bottom of the stairs. If you do have throw rugs, attach them to the floor with carpet tape.  Make sure that you have a light switch at the top of the stairs and the bottom of the stairs. If you do not have them, ask someone to add them for you. What else can I do to help prevent falls?  Wear shoes that:  Do not have high heels.  Have rubber bottoms.  Are comfortable and fit you well.  Are closed at the toe. Do not wear sandals.  If you  use a stepladder:  Make sure that it is fully opened. Do not climb a closed stepladder.  Make sure that both sides of the stepladder are locked into place.  Ask someone to hold it for you, if possible.  Clearly mark and make sure that you can see:  Any grab bars or handrails.  First and last steps.  Where the edge of each step is.  Use tools that help you move around (mobility aids) if they are needed. These include:  Canes.  Walkers.  Scooters.  Crutches.  Turn on the lights when you go into a dark area. Replace any light bulbs as soon as they burn out.  Set up your furniture so you have a clear path. Avoid moving your furniture around.  If any of your floors are uneven, fix them.  If there are any pets around you, be aware of where they are.  Review your medicines with your doctor. Some medicines can make you feel dizzy. This can increase your chance of falling. Ask your doctor what other things that you can do to help prevent falls. This information is not intended to replace advice given to you by your health care provider. Make sure you discuss any questions you have with your health care provider. Document Released: 04/23/2009 Document Revised: 12/03/2015 Document Reviewed: 08/01/2014 Elsevier Interactive Patient Education  2017 [employer].

## 2020-05-06 NOTE — Assessment & Plan Note (Signed)
Cont oral replacement 

## 2020-05-06 NOTE — Assessment & Plan Note (Signed)
stable overall by history and exam, recent data reviewed with pt, and pt to continue medical treatment as before,  to f/u any worsening symptoms or concerns  

## 2020-05-06 NOTE — Patient Instructions (Signed)
Please continue all other medications as before, and refills have been done if requested.  Please have the pharmacy call with any other refills you may need.  Please continue your efforts at being more active, low cholesterol diet, and weight control.  Please keep your appointments with your specialists as you may have planned  Please make an Appointment to return in 6 months, or sooner if needed 

## 2020-05-06 NOTE — Progress Notes (Signed)
Subjective:    Patient ID: Gloria [name], female    DOB: Jan 06, 1927, 86 y.o.   MRN: [id number]  HPI  Here to f/u; overall doing ok,  Pt denies chest pain, increasing sob or doe, wheezing, orthopnea, PND, increased LE swelling, palpitations, dizziness or syncope.  Pt denies new neurological symptoms such as new headache, or facial or extremity weakness or numbness.  Pt denies polydipsia, polyuria, or low sugar episode.  Pt states overall good compliance with meds, mostly trying to follow appropriate diet, with wt overall stable,  but little exercise however. No new complaints.  Has lost further wt but is not concerned.  Denies worsening reflux, abd pain, dysphagia, n/v, bowel change or blood. Wt Readings from Last 3 Encounters:  05/06/20 127 lb (57.6 kg)  05/06/20 127 lb 9.6 oz (57.9 kg)  10/30/19 135 lb (61.2 kg)   Past Medical History:  Diagnosis Date   GERD 04/10/2007   Qualifier: Diagnosis of  By: Maris Berger    HYPERLIPIDEMIA 04/13/2007   Qualifier: Diagnosis of  By: Jonny Ruiz MD, Len Blalock    HYPERTENSION 04/10/2007   Qualifier: Diagnosis of  By: Maris Berger    Impaired glucose tolerance 09/17/2011   OSTEOPOROSIS 04/10/2007   Qualifier: Diagnosis of  By: Maris Berger    Unspecified vitamin D deficiency 08/26/2009   Centricity Description: UNSPECIFIED VITAMIN D DEFICIENCY Qualifier: Diagnosis of  By: Jonny Ruiz MD, Len Blalock  Centricity Description: VITAMIN D DEFICIENCY Qualifier: Diagnosis of  By: Jonny Ruiz MD, Len Blalock    Past Surgical History:  Procedure Laterality Date   CATARACT EXTRACTION      reports that she has never smoked. She has never used smokeless tobacco. She reports that she does not drink alcohol and does not use drugs. family history includes Heart disease in her father; Stroke in her sister. No Known Allergies Current Outpatient Medications on File Prior to Visit  Medication Sig Dispense Refill   aspirin 81 MG EC tablet Take 1 tablet (81  mg total) by mouth daily. Swallow whole. 30 tablet 12   diltiazem (CARDIZEM CD) 240 MG 24 hr capsule Take 1 capsule (240 mg total) by mouth daily. (Patient not taking: Reported on 05/06/2020) 90 capsule 3   lisinopril-hydrochlorothiazide (ZESTORETIC) 20-12.5 MG tablet Take 1 tablet by mouth daily. 90 tablet 3   ondansetron (ZOFRAN) 4 MG tablet Take 1 tablet (4 mg total) by mouth every 8 (eight) hours as needed for nausea or vomiting. (Patient not taking: Reported on 05/06/2020) 20 tablet 1   potassium chloride (KLOR-CON M10) 10 MEQ tablet 2 tab by mouth daily for 3 days (Patient not taking: Reported on 05/06/2020) 6 tablet 0   rosuvastatin (CRESTOR) 40 MG tablet TAKE ONE TABLET BY MOUTH DAILY Must keep appt for future refills 30 tablet 0   Vitamin D, Ergocalciferol, (DRISDOL) 1.25 MG (50000 UNIT) CAPS capsule Take 1 capsule (50,000 Units total) by mouth every 7 (seven) days. 12 capsule 0   No current facility-administered medications on file prior to visit.   Review of Systems All otherwise neg per pt=    Objective:   Physical Exam Ht 5\' 3"  (1.6 m)    Wt 127 lb (57.6 kg)    BMI 22.50 kg/m  VS noted,  Constitutional: Pt appears in NAD HENT: Head: NCAT.  Right Ear: External ear normal.  Left Ear: External ear normal.  Eyes: . Pupils are equal, round, and reactive to light. Conjunctivae and EOM are normal Nose: without  d/c or deformity Neck: Neck supple. Gross normal ROM Cardiovascular: Normal rate and regular rhythm.   Pulmonary/Chest: Effort normal and breath sounds without rales or wheezing.  Abd:  Soft, NT, ND, + BS, no organomegaly Neurological: Pt is alert. At baseline orientation, motor grossly intact Skin: Skin is warm. No rashes, other new lesions, no LE edema Psychiatric: Pt behavior is normal without agitation  Lab Results  Component Value Date   WBC 6.9 10/24/2019   HGB 13.9 10/24/2019   HCT 39.9 10/24/2019   PLT 234.0 10/24/2019   GLUCOSE 108 (H) 10/24/2019    CHOL 143 10/24/2019   TRIG 85.0 10/24/2019   HDL 50.30 10/24/2019   LDLCALC 76 10/24/2019   ALT 21 10/24/2019   AST 26 10/24/2019   NA 136 10/24/2019   K 4.0 10/24/2019   CL 98 10/24/2019   CREATININE 0.75 10/24/2019   BUN 12 10/24/2019   CO2 32 10/24/2019   TSH 2.79 10/24/2019   HGBA1C 5.5 10/24/2019         Assessment & Plan:

## 2020-05-06 NOTE — Telephone Encounter (Signed)
    1.Medication Requested:rosuvastatin (CRESTOR) 40 MG tablet  2. Pharmacy (Name, Street, City):[facility] - [place], [place] - [address]  3. On Med List: yes  4. Last Visit with PCP: 05/06/20  5. Next visit date with PCP:   Agent: Please be advised that RX refills may take up to 3 business days. We ask that you follow-up with your pharmacy.

## 2020-05-07 ENCOUNTER — Other Ambulatory Visit: Payer: Self-pay | Admitting: Internal Medicine

## 2020-05-07 NOTE — Telephone Encounter (Signed)
Please refill as per office routine med refill policy (all routine meds refilled for 3 mo or monthly per pt preference up to one year from last visit, then month to month grace period for 3 mo, then further med refills will have to be denied)  

## 2020-05-08 ENCOUNTER — Other Ambulatory Visit: Payer: Self-pay

## 2020-05-08 MED ORDER — ROSUVASTATIN CALCIUM 40 MG PO TABS: ORAL_TABLET | ORAL | 0 refills | Status: DC

## 2020-06-03 ENCOUNTER — Other Ambulatory Visit: Payer: Self-pay | Admitting: Internal Medicine

## 2020-06-03 NOTE — Telephone Encounter (Signed)
Please refill as per office routine med refill policy (all routine meds refilled for 3 mo or monthly per pt preference up to one year from last visit, then month to month grace period for 3 mo, then further med refills will have to be denied)  

## 2020-07-15 ENCOUNTER — Telehealth: Payer: Self-pay | Admitting: Emergency Medicine

## 2020-07-15 NOTE — Telephone Encounter (Signed)
Pt niece called and is requesting a refill for patient on her rosuvastatin (CRESTOR) 40 MG tablet. [facility]. Thanks.

## 2020-07-16 ENCOUNTER — Other Ambulatory Visit: Payer: Self-pay

## 2020-07-16 MED ORDER — ROSUVASTATIN CALCIUM 40 MG PO TABS: ORAL_TABLET | ORAL | 0 refills | Status: DC

## 2020-08-06 ENCOUNTER — Other Ambulatory Visit: Payer: Self-pay

## 2020-08-06 MED ORDER — ROSUVASTATIN CALCIUM 40 MG PO TABS: ORAL_TABLET | ORAL | 3 refills | Status: DC

## 2020-11-03 ENCOUNTER — Ambulatory Visit: Payer: PPO | Admitting: Internal Medicine

## 2020-11-11 ENCOUNTER — Other Ambulatory Visit: Payer: Self-pay | Admitting: Internal Medicine

## 2020-11-11 ENCOUNTER — Encounter: Payer: Self-pay | Admitting: Internal Medicine

## 2020-11-11 ENCOUNTER — Ambulatory Visit (INDEPENDENT_AMBULATORY_CARE_PROVIDER_SITE_OTHER): Payer: PPO | Admitting: Internal Medicine

## 2020-11-11 ENCOUNTER — Other Ambulatory Visit: Payer: Self-pay

## 2020-11-11 VITALS — BP 138/76 | HR 71 | Temp 98.2°F | Ht 63.0 in | Wt 129.0 lb

## 2020-11-11 DIAGNOSIS — I10 Essential (primary) hypertension: Secondary | ICD-10-CM

## 2020-11-11 DIAGNOSIS — I7121 Aneurysm of the ascending aorta, without rupture: Secondary | ICD-10-CM

## 2020-11-11 DIAGNOSIS — E7800 Pure hypercholesterolemia, unspecified: Secondary | ICD-10-CM

## 2020-11-11 DIAGNOSIS — E559 Vitamin D deficiency, unspecified: Secondary | ICD-10-CM | POA: Diagnosis not present

## 2020-11-11 DIAGNOSIS — R011 Cardiac murmur, unspecified: Secondary | ICD-10-CM | POA: Diagnosis not present

## 2020-11-11 DIAGNOSIS — Z0000 Encounter for general adult medical examination without abnormal findings: Secondary | ICD-10-CM | POA: Diagnosis not present

## 2020-11-11 DIAGNOSIS — I700 Atherosclerosis of aorta: Secondary | ICD-10-CM | POA: Diagnosis not present

## 2020-11-11 DIAGNOSIS — I712 Thoracic aortic aneurysm, without rupture: Secondary | ICD-10-CM

## 2020-11-11 DIAGNOSIS — R7302 Impaired glucose tolerance (oral): Secondary | ICD-10-CM

## 2020-11-11 DIAGNOSIS — M810 Age-related osteoporosis without current pathological fracture: Secondary | ICD-10-CM

## 2020-11-11 DIAGNOSIS — Z23 Encounter for immunization: Secondary | ICD-10-CM | POA: Diagnosis not present

## 2020-11-11 LAB — HEPATIC FUNCTION PANEL
ALT: 16 U/L (ref 0–35)
AST: 22 U/L (ref 0–37)
Albumin: 4.3 g/dL (ref 3.5–5.2)
Alkaline Phosphatase: 68 U/L (ref 39–117)
Bilirubin, Direct: 0.2 mg/dL (ref 0.0–0.3)
Total Bilirubin: 1.2 mg/dL (ref 0.2–1.2)
Total Protein: 7.2 g/dL (ref 6.0–8.3)

## 2020-11-11 LAB — URINALYSIS, ROUTINE W REFLEX MICROSCOPIC
Bilirubin Urine: NEGATIVE
Ketones, ur: NEGATIVE
Nitrite: POSITIVE — AB
Specific Gravity, Urine: 1.01 (ref 1.000–1.030)
Total Protein, Urine: NEGATIVE
Urine Glucose: NEGATIVE
Urobilinogen, UA: 0.2 (ref 0.0–1.0)
pH: 7.5 (ref 5.0–8.0)

## 2020-11-11 LAB — CBC WITH DIFFERENTIAL/PLATELET
Basophils Absolute: 0 10*3/uL (ref 0.0–0.1)
Basophils Relative: 0.5 % (ref 0.0–3.0)
Eosinophils Absolute: 0 10*3/uL (ref 0.0–0.7)
Eosinophils Relative: 0.4 % (ref 0.0–5.0)
HCT: 37.2 % (ref 36.0–46.0)
Hemoglobin: 13 g/dL (ref 12.0–15.0)
Lymphocytes Relative: 22 % (ref 12.0–46.0)
Lymphs Abs: 1.6 10*3/uL (ref 0.7–4.0)
MCHC: 35 g/dL (ref 30.0–36.0)
MCV: 90.3 fl (ref 78.0–100.0)
Monocytes Absolute: 0.6 10*3/uL (ref 0.1–1.0)
Monocytes Relative: 7.7 % (ref 3.0–12.0)
Neutro Abs: 5.1 10*3/uL (ref 1.4–7.7)
Neutrophils Relative %: 69.4 % (ref 43.0–77.0)
Platelets: 232 10*3/uL (ref 150.0–400.0)
RBC: 4.12 Mil/uL (ref 3.87–5.11)
RDW: 12.5 % (ref 11.5–15.5)
WBC: 7.3 10*3/uL (ref 4.0–10.5)

## 2020-11-11 LAB — LIPID PANEL
Cholesterol: 146 mg/dL (ref 0–200)
HDL: 49.6 mg/dL (ref >39.00)
LDL Cholesterol: 76 mg/dL (ref 0–99)
NonHDL: 96.38
Total CHOL/HDL Ratio: 3
Triglycerides: 104 mg/dL (ref 0.0–149.0)
VLDL: 20.8 mg/dL (ref 0.0–40.0)

## 2020-11-11 LAB — HEMOGLOBIN A1C: Hgb A1c MFr Bld: 5.6 % (ref 4.6–6.5)

## 2020-11-11 LAB — BASIC METABOLIC PANEL
BUN: 17 mg/dL (ref 6–23)
CO2: 32 mEq/L (ref 19–32)
Calcium: 9.8 mg/dL (ref 8.4–10.5)
Chloride: 99 mEq/L (ref 96–112)
Creatinine, Ser: 0.78 mg/dL (ref 0.40–1.20)
GFR: 66.16 mL/min (ref >60.00)
Glucose, Bld: 104 mg/dL — ABNORMAL HIGH (ref 70–99)
Potassium: 3.8 mEq/L (ref 3.5–5.1)
Sodium: 137 mEq/L (ref 135–145)

## 2020-11-11 LAB — TSH: TSH: 3.38 u[IU]/mL (ref 0.35–4.50)

## 2020-11-11 LAB — VITAMIN D 25 HYDROXY (VIT D DEFICIENCY, FRACTURES): VITD: 28.69 ng/mL — ABNORMAL LOW (ref 30.00–100.00)

## 2020-11-11 MED ORDER — ROSUVASTATIN CALCIUM 40 MG PO TABS: ORAL_TABLET | ORAL | 3 refills | Status: DC

## 2020-11-11 MED ORDER — THERA-D 2000 50 MCG (2000 UT) PO TABS: ORAL_TABLET | ORAL | 99 refills | Status: DC

## 2020-11-11 MED ORDER — LISINOPRIL-HYDROCHLOROTHIAZIDE 20-12.5 MG PO TABS: 1.0000 | ORAL_TABLET | Freq: Every day | ORAL | 3 refills | Status: DC

## 2020-11-11 MED ORDER — CEPHALEXIN 500 MG PO CAPS: 500.0000 mg | ORAL_CAPSULE | Freq: Three times a day (TID) | ORAL | 0 refills | Status: AC

## 2020-11-11 NOTE — Progress Notes (Signed)
Chief Complaint:: wellness exam        HPI:  Gloria [name] is a 87 y.o. female here for wellness exam; declines covid booster, o/w up to date with preventive referrals and immunizations.                          Also trying to follow lower chol diet.  Pt denies chest pain, increased sob or doe, wheezing, orthopnea, PND, increased LE swelling, palpitations, dizziness or syncope.    Wt Readings from Last 3 Encounters:  11/11/20 129 lb (58.5 kg)  05/06/20 127 lb (57.6 kg)  05/06/20 127 lb 9.6 oz (57.9 kg)   BP Readings from Last 3 Encounters:  11/11/20 138/76  05/06/20 124/70  10/30/19 (!) 158/100   Immunization History  Administered Date(s) Administered  . Influenza Whole 05/18/2007  . PFIZER(Purple Top)SARS-COV-2 Vaccination 03/09/2020, 03/30/2020  . Pneumococcal Conjugate-13 11/01/2013  . Pneumococcal Polysaccharide-23 07/11/2005  . Td 06/20/2008  . Tdap 11/11/2020   There are no preventive care reminders to display for this patient.    Past Medical History:  Diagnosis Date  . GERD 04/10/2007   Qualifier: Diagnosis of  By: Maris Berger   . HYPERLIPIDEMIA 04/13/2007   Qualifier: Diagnosis of  By: Jonny Ruiz MD, Len Blalock   . HYPERTENSION 04/10/2007   Qualifier: Diagnosis of  By: Maris Berger   . Impaired glucose tolerance 09/17/2011  . OSTEOPOROSIS 04/10/2007   Qualifier: Diagnosis of  By: Maris Berger   . Unspecified vitamin D deficiency 08/26/2009   Centricity Description: UNSPECIFIED VITAMIN D DEFICIENCY Qualifier: Diagnosis of  By: Jonny Ruiz MD, Len Blalock  Centricity Description: VITAMIN D DEFICIENCY Qualifier: Diagnosis of  By: Jonny Ruiz MD, Len Blalock    Past Surgical History:  Procedure Laterality Date  . CATARACT EXTRACTION      reports that she has never smoked. She has never used smokeless tobacco. She reports that she does not drink alcohol and does not use drugs. family history includes Heart disease in her father; Stroke in her sister. No  Known Allergies Current Outpatient Medications on File Prior to Visit  Medication Sig Dispense Refill  . aspirin 81 MG EC tablet Take 1 tablet (81 mg total) by mouth daily. Swallow whole. 30 tablet 12   No current facility-administered medications on file prior to visit.        ROS:  All others reviewed and negative.  Objective        PE:  BP 138/76 (BP Location: Right Arm, Patient Position: Sitting, Cuff Size: Normal)   Pulse 71   Temp 98.2 F (36.8 C) (Oral)   Ht 5\' 3"  (1.6 m)   Wt 129 lb (58.5 kg)   SpO2 98%   BMI 22.85 kg/m                 Constitutional: Pt appears in NAD               HENT: Head: NCAT.                Right Ear: External ear normal.                 Left Ear: External ear normal.                Eyes: . Pupils are equal, round, and reactive to light. Conjunctivae and EOM are normal  Nose: without d/c or deformity               Neck: Neck supple. Gross normal ROM               Cardiovascular: Normal rate and regular rhythm.  With gr 2-3/6 sys murmur LUSB               Pulmonary/Chest: Effort normal and breath sounds without rales or wheezing.                Abd:  Soft, NT, ND, + BS, no organomegaly               Neurological: Pt is alert. At baseline orientation, motor grossly intact               Skin: Skin is warm. No rashes, no other new lesions, LE edema - none               Psychiatric: Pt behavior is normal without agitation   Micro: none  Cardiac tracings I have personally interpreted today:  none  Pertinent Radiological findings (summarize): none   Lab Results  Component Value Date   WBC 7.3 11/11/2020   HGB 13.0 11/11/2020   HCT 37.2 11/11/2020   PLT 232.0 11/11/2020   GLUCOSE 104 (H) 11/11/2020   CHOL 146 11/11/2020   TRIG 104.0 11/11/2020   HDL 49.60 11/11/2020   LDLCALC 76 11/11/2020   ALT 16 11/11/2020   AST 22 11/11/2020   NA 137 11/11/2020   K 3.8 11/11/2020   CL 99 11/11/2020   CREATININE 0.78 11/11/2020   BUN  17 11/11/2020   CO2 32 11/11/2020   TSH 3.38 11/11/2020   HGBA1C 5.6 11/11/2020   Assessment/Plan:  Gloria [name] is a 87 y.o. White or Caucasian [1] female with  has a past medical history of GERD (04/10/2007), HYPERLIPIDEMIA (04/13/2007), HYPERTENSION (04/10/2007), Impaired glucose tolerance (09/17/2011), OSTEOPOROSIS (04/10/2007), and Unspecified vitamin D deficiency (08/26/2009).  Osteoporosis With hx of t spine compressoin fx, declines dxa or prolia  Vitamin D deficiency For vit D 2000 u qd  Impaired glucose tolerance For a1c with lab  Hyperlipidemia Lab Results  Component Value Date   LDLCALC 76 10/24/2019   Stable, pt to continue current statin crestor   Current Outpatient Medications (Cardiovascular):  .  lisinopril-hydrochlorothiazide (ZESTORETIC) 20-12.5 MG tablet, Take 1 tablet by mouth daily. .  rosuvastatin (CRESTOR) 40 MG tablet, TAKE ONE TABLET BY MOUTH DAILY   Current Outpatient Medications (Analgesics):  .  aspirin 81 MG EC tablet, Take 1 tablet (81 mg total) by mouth daily. Swallow whole.     Heart murmur Suspect AS - declines echo  Essential hypertension BP Readings from Last 3 Encounters:  11/11/20 138/76  05/06/20 124/70  10/30/19 (!) 158/100   Stable, pt to continue medical treatment zestoretic   Preventative health care .Age and sex appropriate education and counseling updated with regular exercise and diet Referrals for preventative services - none needed Immunizations addressed - for tdap Smoking counseling  - none needed Evidence for depression or other mood disorder - none significant Most recent labs reviewed. I have personally reviewed and have noted: 1) the patient's medical and social history 2) The patient's current medications and supplements 3) The patient's height, weight, and BMI have been recorded in the chart   Aortic atherosclerosis (HCC) For statin, diet, excercise  Ascending aortic aneurysm (HCC) Declines further  consideration for now such as CTA  Followup: Return  in about 1 year (around 11/11/2021).  Oliver Barre, MD 11/14/2020 11:03 PM [facility]

## 2020-11-11 NOTE — Assessment & Plan Note (Signed)
For a1c with lab 

## 2020-11-11 NOTE — Assessment & Plan Note (Signed)
.  Age and sex appropriate education and counseling updated with regular exercise and diet Referrals for preventative services - none needed Immunizations addressed - for tdap Smoking counseling  - none needed Evidence for depression or other mood disorder - none significant Most recent labs reviewed. I have personally reviewed and have noted: 1) the patient's medical and social history 2) The patient's current medications and supplements 3) The patient's height, weight, and BMI have been recorded in the chart

## 2020-11-11 NOTE — Assessment & Plan Note (Signed)
Declines further consideration for now such as CTA

## 2020-11-11 NOTE — Assessment & Plan Note (Signed)
For statin, diet, excercise

## 2020-11-11 NOTE — Assessment & Plan Note (Signed)
Suspect AS - declines echo

## 2020-11-11 NOTE — Assessment & Plan Note (Signed)
With hx of t spine compressoin fx, declines dxa or prolia

## 2020-11-11 NOTE — Assessment & Plan Note (Signed)
BP Readings from Last 3 Encounters:  11/11/20 138/76  05/06/20 124/70  10/30/19 (!) 158/100   Stable, pt to continue medical treatment zestoretic

## 2020-11-11 NOTE — Patient Instructions (Addendum)
You had the Tdap tetanus shot today  Please continue all other medications as before, and refills have been done if requested.  Please have the pharmacy call with any other refills you may need.  Please continue your efforts at being more active, low cholesterol diet, and weight control.  You are otherwise up to date with prevention measures today.  Please keep your appointments with your specialists as you may have planned  Please go to the LAB at the blood drawing area for the tests to be done  You will be contacted by phone if any changes need to be made immediately.  Otherwise, you will receive a letter about your results with an explanation, but please check with MyChart first.  Please remember to sign up for MyChart if you have not done so, as this will be important to you in the future with finding out test results, communicating by private email, and scheduling acute appointments online when needed.  Please make an Appointment to return for your 1 year visit, or sooner if needed 

## 2020-11-11 NOTE — Assessment & Plan Note (Signed)
For vit D 2000 u qd

## 2020-11-11 NOTE — Assessment & Plan Note (Signed)
Lab Results  Component Value Date   LDLCALC 76 10/24/2019   Stable, pt to continue current statin crestor   Current Outpatient Medications (Cardiovascular):  .  lisinopril-hydrochlorothiazide (ZESTORETIC) 20-12.5 MG tablet, Take 1 tablet by mouth daily. .  rosuvastatin (CRESTOR) 40 MG tablet, TAKE ONE TABLET BY MOUTH DAILY   Current Outpatient Medications (Analgesics):  .  aspirin 81 MG EC tablet, Take 1 tablet (81 mg total) by mouth daily. Swallow whole.

## 2020-11-14 ENCOUNTER — Encounter: Payer: Self-pay | Admitting: Internal Medicine

## 2021-05-07 ENCOUNTER — Ambulatory Visit: Payer: PPO

## 2021-11-16 ENCOUNTER — Encounter: Payer: Self-pay | Admitting: Internal Medicine

## 2021-11-16 ENCOUNTER — Ambulatory Visit (INDEPENDENT_AMBULATORY_CARE_PROVIDER_SITE_OTHER): Payer: PPO | Admitting: Internal Medicine

## 2021-11-16 ENCOUNTER — Ambulatory Visit: Payer: PPO

## 2021-11-16 ENCOUNTER — Ambulatory Visit (INDEPENDENT_AMBULATORY_CARE_PROVIDER_SITE_OTHER): Payer: PPO

## 2021-11-16 VITALS — BP 112/76 | HR 71 | Temp 97.6°F | Ht 63.0 in | Wt 131.0 lb

## 2021-11-16 VITALS — BP 112/76 | HR 71 | Temp 97.6°F | Ht 63.0 in | Wt 131.8 lb

## 2021-11-16 DIAGNOSIS — E559 Vitamin D deficiency, unspecified: Secondary | ICD-10-CM

## 2021-11-16 DIAGNOSIS — I10 Essential (primary) hypertension: Secondary | ICD-10-CM | POA: Diagnosis not present

## 2021-11-16 DIAGNOSIS — R7302 Impaired glucose tolerance (oral): Secondary | ICD-10-CM | POA: Diagnosis not present

## 2021-11-16 DIAGNOSIS — Z0000 Encounter for general adult medical examination without abnormal findings: Secondary | ICD-10-CM

## 2021-11-16 DIAGNOSIS — R011 Cardiac murmur, unspecified: Secondary | ICD-10-CM

## 2021-11-16 DIAGNOSIS — E7800 Pure hypercholesterolemia, unspecified: Secondary | ICD-10-CM | POA: Diagnosis not present

## 2021-11-16 DIAGNOSIS — E538 Deficiency of other specified B group vitamins: Secondary | ICD-10-CM

## 2021-11-16 LAB — CBC WITH DIFFERENTIAL/PLATELET
Basophils Absolute: 0 10*3/uL (ref 0.0–0.1)
Basophils Relative: 0.5 % (ref 0.0–3.0)
Eosinophils Absolute: 0 10*3/uL (ref 0.0–0.7)
Eosinophils Relative: 0.4 % (ref 0.0–5.0)
HCT: 37.6 % (ref 36.0–46.0)
Hemoglobin: 13.1 g/dL (ref 12.0–15.0)
Lymphocytes Relative: 20.6 % (ref 12.0–46.0)
Lymphs Abs: 1.6 10*3/uL (ref 0.7–4.0)
MCHC: 34.8 g/dL (ref 30.0–36.0)
MCV: 90.2 fl (ref 78.0–100.0)
Monocytes Absolute: 0.6 10*3/uL (ref 0.1–1.0)
Monocytes Relative: 7.7 % (ref 3.0–12.0)
Neutro Abs: 5.3 10*3/uL (ref 1.4–7.7)
Neutrophils Relative %: 70.8 % (ref 43.0–77.0)
Platelets: 234 10*3/uL (ref 150.0–400.0)
RBC: 4.17 Mil/uL (ref 3.87–5.11)
RDW: 12.9 % (ref 11.5–15.5)
WBC: 7.6 10*3/uL (ref 4.0–10.5)

## 2021-11-16 LAB — URINALYSIS, ROUTINE W REFLEX MICROSCOPIC
Bilirubin Urine: NEGATIVE
Hgb urine dipstick: NEGATIVE
Ketones, ur: NEGATIVE
Nitrite: NEGATIVE
RBC / HPF: NONE SEEN (ref 0–?)
Specific Gravity, Urine: <=1.005 — AB (ref 1.000–1.030)
Total Protein, Urine: NEGATIVE
Urine Glucose: NEGATIVE
Urobilinogen, UA: 0.2 (ref 0.0–1.0)
pH: 7 (ref 5.0–8.0)

## 2021-11-16 LAB — LIPID PANEL
Cholesterol: 143 mg/dL (ref 0–200)
HDL: 54.8 mg/dL (ref >39.00)
LDL Cholesterol: 65 mg/dL (ref 0–99)
NonHDL: 87.99
Total CHOL/HDL Ratio: 3
Triglycerides: 115 mg/dL (ref 0.0–149.0)
VLDL: 23 mg/dL (ref 0.0–40.0)

## 2021-11-16 LAB — HEPATIC FUNCTION PANEL
ALT: 20 U/L (ref 0–35)
AST: 27 U/L (ref 0–37)
Albumin: 4.6 g/dL (ref 3.5–5.2)
Alkaline Phosphatase: 64 U/L (ref 39–117)
Bilirubin, Direct: 0.2 mg/dL (ref 0.0–0.3)
Total Bilirubin: 1.3 mg/dL — ABNORMAL HIGH (ref 0.2–1.2)
Total Protein: 7.5 g/dL (ref 6.0–8.3)

## 2021-11-16 LAB — BASIC METABOLIC PANEL
BUN: 14 mg/dL (ref 6–23)
CO2: 31 mEq/L (ref 19–32)
Calcium: 9.9 mg/dL (ref 8.4–10.5)
Chloride: 96 mEq/L (ref 96–112)
Creatinine, Ser: 0.75 mg/dL (ref 0.40–1.20)
GFR: 68.86 mL/min (ref >60.00)
Glucose, Bld: 115 mg/dL — ABNORMAL HIGH (ref 70–99)
Potassium: 4 mEq/L (ref 3.5–5.1)
Sodium: 135 mEq/L (ref 135–145)

## 2021-11-16 LAB — HEMOGLOBIN A1C: Hgb A1c MFr Bld: 5.5 % (ref 4.6–6.5)

## 2021-11-16 LAB — TSH: TSH: 3.35 u[IU]/mL (ref 0.35–5.50)

## 2021-11-16 LAB — VITAMIN D 25 HYDROXY (VIT D DEFICIENCY, FRACTURES): VITD: 37.14 ng/mL (ref 30.00–100.00)

## 2021-11-16 LAB — VITAMIN B12: Vitamin B-12: 387 pg/mL (ref 211–911)

## 2021-11-16 MED ORDER — LISINOPRIL-HYDROCHLOROTHIAZIDE 20-12.5 MG PO TABS: 1.0000 | ORAL_TABLET | Freq: Every day | ORAL | 3 refills | Status: DC

## 2021-11-16 MED ORDER — ROSUVASTATIN CALCIUM 40 MG PO TABS: ORAL_TABLET | ORAL | 3 refills | Status: DC

## 2021-11-16 NOTE — Progress Notes (Signed)
?I connected with Ercel Stroh today by telephone and verified that I am speaking with the correct person using two identifiers. ?Location patient: home ?Location provider: work ?Persons participating in the virtual visit: patient, provider. ?  ?I discussed the limitations, risks, security and privacy concerns of performing an evaluation and management service by telephone and the availability of in person appointments. I also discussed with the patient that there may be a patient responsible charge related to this service. The patient expressed understanding and verbally consented to this telephonic visit.  ?  ?Interactive audio and video telecommunications were attempted between this provider and patient, however failed, due to patient having technical difficulties OR patient did not have access to video capability.  We continued and completed visit with audio only. ? ?Some vital signs may be absent or patient reported.  ? ?Time Spent with patient on telephone encounter: 30 minutes ? ?Subjective:  ? Gloria [name] is a 88 y.o. female who presents for Medicare Annual (Subsequent) preventive examination. ? ?Review of Systems    ? ?Cardiac Risk Factors include: advanced age (>23men, >47 women);dyslipidemia;family history of premature cardiovascular disease;hypertension ? ?   ?Objective:  ?  ?Today's Vitals  ? 11/16/21 [id number]  ?BP: 112/76  ?Pulse: 71  ?Temp: 97.6 ?F (36.4 ?C)  ?SpO2: 98%  ?Weight: 131 lb (59.4 kg)  ?Height: 5\' 3"  (1.6 m)  ?PainSc: 0-No pain  ? ?Body mass index is 23.21 kg/m?. ? ? ?  11/16/2021  ?  9:48 AM 05/06/2020  ? 12:46 PM  ?Advanced Directives  ?Does Patient Have a Medical Advance Directive? Yes Yes  ?Type of Advance Directive Living will;Healthcare Power of Parole;Living will  ?Does patient want to make changes to medical advance directive? No - Patient declined No - Patient declined  ?Copy of [place] in Chart? No - copy requested No - copy  requested  ? ? ?Current Medications (verified) ?Outpatient Encounter Medications as of 11/16/2021  ?Medication Sig  ? Cholecalciferol (THERA-D 2000) 50 MCG (2000 UT) TABS 1 tab by mouth once daily  ? lisinopril-hydrochlorothiazide (ZESTORETIC) 20-12.5 MG tablet Take 1 tablet by mouth daily.  ? rosuvastatin (CRESTOR) 40 MG tablet TAKE ONE TABLET BY MOUTH DAILY  ? aspirin 81 MG EC tablet Take 1 tablet (81 mg total) by mouth daily. Swallow whole. (Patient not taking: Reported on 11/16/2021)  ? ?No facility-administered encounter medications on file as of 11/16/2021.  ? ? ?Allergies (verified) ?Patient has no known allergies.  ? ?History: ?Past Medical History:  ?Diagnosis Date  ? GERD 04/10/2007  ? Qualifier: Diagnosis of  By: Elveria Royals   ? HYPERLIPIDEMIA 04/13/2007  ? Qualifier: Diagnosis of  By: Jenny Reichmann MD, Hunt Oris   ? HYPERTENSION 04/10/2007  ? Qualifier: Diagnosis of  By: Elveria Royals   ? Impaired glucose tolerance 09/17/2011  ? OSTEOPOROSIS 04/10/2007  ? Qualifier: Diagnosis of  By: Elveria Royals   ? Unspecified vitamin D deficiency 08/26/2009  ? Centricity Description: UNSPECIFIED VITAMIN D DEFICIENCY Qualifier: Diagnosis of  By: Jenny Reichmann MD, Hunt Oris  Centricity Description: VITAMIN D DEFICIENCY Qualifier: Diagnosis of  By: Jenny Reichmann MD, Hunt Oris   ? ?Past Surgical History:  ?Procedure Laterality Date  ? CATARACT EXTRACTION    ? ?Family History  ?Problem Relation Age of Onset  ? Heart disease Father   ? Stroke Sister   ? ?Social History  ? ?Socioeconomic History  ? Marital status: Single  ?  Spouse name:  Not on file  ? Number of children: Not on file  ? Years of education: Not on file  ? Highest education level: Not on file  ?Occupational History  ? Not on file  ?Tobacco Use  ? Smoking status: Never  ? Smokeless tobacco: Never  ?Substance and Sexual Activity  ? Alcohol use: No  ? Drug use: No  ? Sexual activity: Not on file  ?Other Topics Concern  ? Not on file  ?Social History Narrative  ? Not on file   ? ?Social Determinants of Health  ? ?Financial Resource Strain: Low Risk   ? Difficulty of Paying Living Expenses: Not hard at all  ?Food Insecurity: No Food Insecurity  ? Worried About Charity fundraiser in the Last Year: Never true  ? Ran Out of Food in the Last Year: Never true  ?Transportation Needs: No Transportation Needs  ? Lack of Transportation (Medical): No  ? Lack of Transportation (Non-Medical): No  ?Physical Activity: Sufficiently Active  ? Days of Exercise per Week: 5 days  ? Minutes of Exercise per Session: 30 min  ?Stress: No Stress Concern Present  ? Feeling of Stress : Not at all  ?Social Connections: Moderately Integrated  ? Frequency of Communication with Friends and Family: More than three times a week  ? Frequency of Social Gatherings with Friends and Family: More than three times a week  ? Attends Religious Services: More than 4 times per year  ? Active Member of Clubs or Organizations: Yes  ? Attends Archivist Meetings: More than 4 times per year  ? Marital Status: Widowed  ? ? ?Tobacco Counseling ?Counseling given: Not Answered ? ? ?Clinical Intake: ? ?Pre-visit preparation completed: Yes ? ?Pain : No/denies pain ?Pain Score: 0-No pain ? ?  ? ?Diabetes: No ? ?How often do you need to have someone help you when you read instructions, pamphlets, or other written materials from your doctor or pharmacy?: 1 - Never ?What is the last grade level you completed in school?: HSG ? ?Diabetic? no ? ?Interpreter Needed?: No ? ?Information entered by :: Lisette Abu, LPN. ? ? ?Activities of Daily Living ? ?  11/16/2021  ?  9:57 AM  ?In your present state of health, do you have any difficulty performing the following activities:  ?Hearing? 0  ?Vision? 0  ?Difficulty concentrating or making decisions? 0  ?Walking or climbing stairs? 0  ?Dressing or bathing? 0  ?Doing errands, shopping? 0  ?Preparing Food and eating ? N  ?Using the Toilet? N  ?In the past six months, have you accidently leaked  urine? N  ?Do you have problems with loss of bowel control? N  ?Managing your Medications? N  ?Managing your Finances? N  ?Housekeeping or managing your Housekeeping? N  ? ? ?Patient Care Team: ?Biagio Borg, MD as PCP - General ?[facility] as Consulting Physician [facility]) ? ?Indicate any recent Medical Services you may have received from other than [facility] providers in the past year (date may be approximate). ? ?   ?Assessment:  ? This is a routine wellness examination for Monette. ? ?Hearing/Vision screen ?Hearing Screening - Comments:: Patient denied any hearing difficulty.   ?No hearing aids. ? ?Vision Screening - Comments:: Patient does not wear any corrective lenses/contacts.   ?Eye exam done by: [facility] ? ? ?Dietary issues and exercise activities discussed: ?Current Exercise Habits: Home exercise routine, Type of exercise: walking;Other - see comments (stationary bike;  yard work and house work; stays active), Time (Minutes): 30, Frequency (Times/Week): 5, Weekly Exercise (Minutes/Week): 150, Intensity: Moderate ? ? Goals Addressed   ? ?  ?  ?  ?  ? This Visit's Progress  ?  Patient declined health goal at this time.     ? ?  ?Depression Screen ? ?  11/16/2021  ?  9:57 AM 11/16/2021  ?  8:21 AM 11/16/2021  ?  8:04 AM 11/11/2020  ?  8:03 AM 05/06/2020  ? 12:44 PM 10/30/2019  ? 10:26 AM 11/15/2017  ?  8:00 AM  ?PHQ 2/9 Scores  ?PHQ - 2 Score 0 0 0 0 0 0 0  ?PHQ- 9 Score 0  0    0  ?  ?Fall Risk ? ?  11/16/2021  ?  9:49 AM 11/16/2021  ?  8:20 AM 11/16/2021  ?  8:04 AM 11/11/2020  ?  8:03 AM 05/06/2020  ? 12:45 PM  ?Fall Risk   ?Falls in the past year? 0 0 0 0 0  ?Number falls in past yr: 0 0 0 0 0  ?Injury with Fall? 0 0 0 0 0  ?Risk for fall due to : No Fall Risks    No Fall Risks  ?Follow up Falls evaluation completed    Falls evaluation completed  ? ? ?FALL RISK PREVENTION PERTAINING TO THE HOME: ? ?Any stairs in or around the home? No  ?If so, are there any without handrails? No   ?Home free of loose throw rugs in walkways, pet beds, electrical cords, etc? Yes  ?Adequate lighting in your home to reduce risk of falls? Yes  ? ?ASSISTIVE DEVICES UTILIZED TO PREVENT FALLS: ? ?Lif

## 2021-11-16 NOTE — Progress Notes (Signed)
Patient ID: Gloria [name], female   DOB: Mar 25, 1927, 88 y.o.   MRN: [id number] ? ? ? ?     Chief Complaint:: wellness exam and low vit d, and heart murmur ? ?     HPI:  Gloria [name] is a 88 y.o. female here for wellness exam; already up to date ?         ?              Also Pt denies chest pain, increased sob or doe, wheezing, orthopnea, PND, increased [name] swelling, palpitations, dizziness or syncope.   Pt denies polydipsia, polyuria, or new focal neuro s/s.    Pt denies fever, wt loss, night sweats, loss of appetite, or other constitutional symptoms   ?  ?Wt Readings from Last 3 Encounters:  ?11/16/21 131 lb (59.4 kg)  ?11/16/21 131 lb 12.8 oz (59.8 kg)  ?11/11/20 129 lb (58.5 kg)  ? ?BP Readings from Last 3 Encounters:  ?11/16/21 112/76  ?11/16/21 112/76  ?11/11/20 138/76  ? ?Immunization History  ?Administered Date(s) Administered  ? Influenza Whole 05/18/2007  ? PFIZER(Purple Top)SARS-COV-2 Vaccination 03/09/2020, 03/30/2020  ? Pneumococcal Conjugate-13 11/01/2013  ? Pneumococcal Polysaccharide-23 07/11/2005  ? Td 06/20/2008  ? Tdap 11/11/2020  ?There are no preventive care reminders to display for this patient. ?  ? ?Past Medical History:  ?Diagnosis Date  ? GERD 04/10/2007  ? Qualifier: Diagnosis of  By: Maris Berger   ? HYPERLIPIDEMIA 04/13/2007  ? Qualifier: Diagnosis of  By: Jonny Ruiz MD, Len Blalock   ? HYPERTENSION 04/10/2007  ? Qualifier: Diagnosis of  By: Maris Berger   ? Impaired glucose tolerance 09/17/2011  ? OSTEOPOROSIS 04/10/2007  ? Qualifier: Diagnosis of  By: Maris Berger   ? Unspecified vitamin D deficiency 08/26/2009  ? Centricity Description: UNSPECIFIED VITAMIN D DEFICIENCY Qualifier: Diagnosis of  By: Jonny Ruiz MD, Len Blalock  Centricity Description: VITAMIN D DEFICIENCY Qualifier: Diagnosis of  By: Jonny Ruiz MD, Len Blalock   ? ?Past Surgical History:  ?Procedure Laterality Date  ? CATARACT EXTRACTION    ? ? reports that she has never smoked. She has never used smokeless tobacco. She  reports that she does not drink alcohol and does not use drugs. ?family history includes Heart disease in her father; Stroke in her sister. ?No Known Allergies ?Current Outpatient Medications on File Prior to Visit  ?Medication Sig Dispense Refill  ? Cholecalciferol (THERA-D 2000) 50 MCG (2000 UT) TABS 1 tab by mouth once daily 30 tablet 99  ? aspirin 81 MG EC tablet Take 1 tablet (81 mg total) by mouth daily. Swallow whole. (Patient not taking: Reported on 11/16/2021) 30 tablet 12  ? ?No current facility-administered medications on file prior to visit.  ? ?     ROS:  All others reviewed and negative. ? ?Objective  ? ?     PE:  BP 112/76 (BP Location: Right Arm, Patient Position: Sitting, Cuff Size: Normal)   Pulse 71   Temp 97.6 ?F (36.4 ?C) (Oral)   Ht 5\' 3"  (1.6 m)   Wt 131 lb 12.8 oz (59.8 kg)   SpO2 98%   BMI 23.35 kg/m?  ? ?              Constitutional: Pt appears in NAD ?              HENT: Head: NCAT.  ?              Right Ear:  External ear normal.   ?              Left Ear: External ear normal.  ?              Eyes: . Pupils are equal, round, and reactive to light. Conjunctivae and EOM are normal ?              Nose: without d/c or deformity ?              Neck: Neck supple. Gross normal ROM ?              Cardiovascular: Normal rate and regular rhythm.  With gr 3/6 sys murmur RUSB ?              Pulmonary/Chest: Effort normal and breath sounds without rales or wheezing.  ?              Abd:  Soft, NT, ND, + BS, no organomegaly ?              Neurological: Pt is alert. At baseline orientation, motor grossly intact ?              Skin: Skin is warm. No rashes, no other new lesions, [name] edema - none ?              Psychiatric: Pt behavior is normal without agitation  ? ?Micro: none ? ?Cardiac tracings I have personally interpreted today:  none ? ?Pertinent Radiological findings (summarize): none  ? ?Lab Results  ?Component Value Date  ? WBC 7.6 11/16/2021  ? HGB 13.1 11/16/2021  ? HCT 37.6 11/16/2021  ?  PLT 234.0 11/16/2021  ? GLUCOSE 115 (H) 11/16/2021  ? CHOL 143 11/16/2021  ? TRIG 115.0 11/16/2021  ? HDL 54.80 11/16/2021  ? LDLCALC 65 11/16/2021  ? ALT 20 11/16/2021  ? AST 27 11/16/2021  ? NA 135 11/16/2021  ? K 4.0 11/16/2021  ? CL 96 11/16/2021  ? CREATININE 0.75 11/16/2021  ? BUN 14 11/16/2021  ? CO2 31 11/16/2021  ? TSH 3.35 11/16/2021  ? HGBA1C 5.5 11/16/2021  ? ?Assessment/Plan:  ?Gloria [name] is a 88 y.o. White or Caucasian [1] female with  has a past medical history of GERD (04/10/2007), HYPERLIPIDEMIA (04/13/2007), HYPERTENSION (04/10/2007), Impaired glucose tolerance (09/17/2011), OSTEOPOROSIS (04/10/2007), and Unspecified vitamin D deficiency (08/26/2009). ? ?Vitamin D deficiency ?Last vitamin D ?Lab Results  ?Component Value Date  ? VD25OH 28.69 (L) 11/11/2020  ? ?Low, to start oral replacement ? ? ?Preventative health care ?Age and sex appropriate education and counseling updated with regular exercise and diet ?Referrals for preventative services - none needed ?Immunizations addressed - none needed ?Smoking counseling  - none needed ?Evidence for depression or other mood disorder - none significant ?Most recent labs reviewed. ?I have personally reviewed and have noted: ?1) the patient's medical and social history ?2) The patient's current medications and supplements ?3) The patient's height, weight, and BMI have been recorded in the chart ? ? ?Hyperlipidemia ?Lab Results  ?Component Value Date  ? LDLCALC 65 11/16/2021  ? ?Stable, pt to continue current statin crestor ? ? ?Essential hypertension ?BP Readings from Last 3 Encounters:  ?11/16/21 112/76  ?11/16/21 112/76  ?11/11/20 138/76  ? ?Stable, pt to continue medical treatment zestoretic ? ? ?Impaired glucose tolerance ?Lab Results  ?Component Value Date  ? HGBA1C 5.5 11/16/2021  ? ?Stable, pt to continue current medical treatment  - diet ? ? ?Heart  murmur ?Etiology unclear, asympt per pt, declines echo for now ? ?Followup: Return in about 1 year  (around 11/17/2022). ? ?Oliver Barre, MD 11/20/2021 9:40 PM ?[facility] ? Primary Care - [facility] ?Internal Medicine ?

## 2021-11-16 NOTE — Patient Instructions (Signed)
Please call if you would like to have the Echocardiogram done for your heart murmur ? ?Please continue all other medications as before, including the Vitamin D3 ? ?Please have the pharmacy call with any other refills you may need. ? ?Please continue your efforts at being more active, low cholesterol diet, and weight control. ? ?You are otherwise up to date with prevention measures today. ? ?Please keep your appointments with your specialists as you may have planned ? ?Please go to the LAB at the blood drawing area for the tests to be done ? ?You will be contacted by phone if any changes need to be made immediately.  Otherwise, you will receive a letter about your results with an explanation, but please check with MyChart first. ? ?Please remember to sign up for MyChart if you have not done so, as this will be important to you in the future with finding out test results, communicating by private email, and scheduling acute appointments online when needed. ? ?Please make an Appointment to return for your 1 year visit, or sooner if needed ?

## 2021-11-16 NOTE — Assessment & Plan Note (Signed)
Last vitamin D ?Lab Results  ?Component Value Date  ? VD25OH 28.69 (L) 11/11/2020  ? ?Low, to start oral replacement ? ?

## 2021-11-16 NOTE — Patient Instructions (Signed)
Ms. Orvan FalconerCampbell , ?Thank you for taking time to come for your Medicare Wellness Visit. I appreciate your ongoing commitment to your health goals. Please review the following plan we discussed and let me know if I can assist you in the future.  ? ?Screening recommendations/referrals: ?Colonoscopy: Discontinued due to age ?Mammogram: Discontinued due to age ?Bone Density: Never done ?Recommended yearly ophthalmology/optometry visit for glaucoma screening and checkup ?Recommended yearly dental visit for hygiene and checkup ? ?Vaccinations: ?Influenza vaccine: declined ?Pneumococcal vaccine: 07/11/2005, 11/01/2013 ?Tdap vaccine: 11/11/2020; due every 10 years ?Shingles vaccine: declined   ?Covid-19: 03/09/2020, 03/30/2020 ? ?Advanced directives: Yes; Please bring a copy of your health care power of attorney and living will to the office at your convenience. ? ?Conditions/risks identified: Yes ? ?Next appointment: 11/22/2022 at 8:00 a.m. with Dr. Oliver BarreJames [name] and follow-up Medicare Wellness Visit with Percell MillerShenika, Nurse Health Advisor 11/22/2022 at 9:00 a.m. in the office.  If you need to reschedule or cancel, please call [phone]. ? ? ?Preventive Care 7965 Years and Older, Female ?Preventive care refers to lifestyle choices and visits with your health care provider that can promote health and wellness. ?What does preventive care include? ?A yearly physical exam. This is also called an annual well check. ?Dental exams once or twice a year. ?Routine eye exams. Ask your health care provider how often you should have your eyes checked. ?Personal lifestyle choices, including: ?Daily care of your teeth and gums. ?Regular physical activity. ?Eating a healthy diet. ?Avoiding tobacco and drug use. ?Limiting alcohol use. ?Practicing safe sex. ?Taking low-dose aspirin every day. ?Taking vitamin and mineral supplements as recommended by your health care provider. ?What happens during an annual well check? ?The services and screenings done by your  health care provider during your annual well check will depend on your age, overall health, lifestyle risk factors, and family history of disease. ?Counseling  ?Your health care provider may ask you questions about your: ?Alcohol use. ?Tobacco use. ?Drug use. ?Emotional well-being. ?Home and relationship well-being. ?Sexual activity. ?Eating habits. ?History of falls. ?Memory and ability to understand (cognition). ?Work and work Astronomerenvironment. ?Reproductive health. ?Screening  ?You may have the following tests or measurements: ?Height, weight, and BMI. ?Blood pressure. ?Lipid and cholesterol levels. These may be checked every 5 years, or more frequently if you are over 86 years old. ?Skin check. ?Lung cancer screening. You may have this screening every year starting at age 86 if you have a 30-pack-year history of smoking and currently smoke or have quit within the past 15 years. ?Fecal occult blood test (FOBT) of the stool. You may have this test every year starting at age 86. ?Flexible sigmoidoscopy or colonoscopy. You may have a sigmoidoscopy every 5 years or a colonoscopy every 10 years starting at age 86. ?Hepatitis C blood test. ?Hepatitis B blood test. ?Sexually transmitted disease (STD) testing. ?Diabetes screening. This is done by checking your blood sugar (glucose) after you have not eaten for a while (fasting). You may have this done every 1-3 years. ?Bone density scan. This is done to screen for osteoporosis. You may have this done starting at age 86. ?Mammogram. This may be done every 1-2 years. Talk to your health care provider about how often you should have regular mammograms. ?Talk with your health care provider about your test results, treatment options, and if necessary, the need for more tests. ?Vaccines  ?Your health care provider may recommend certain vaccines, such as: ?Influenza vaccine. This is recommended every year. ?Tetanus, diphtheria,  and acellular pertussis (Tdap, Td) vaccine. You may  need a Td booster every 10 years. ?Zoster vaccine. You may need this after age 52. ?Pneumococcal 13-valent conjugate (PCV13) vaccine. One dose is recommended after age 33. ?Pneumococcal polysaccharide (PPSV23) vaccine. One dose is recommended after age 60. ?Talk to your health care provider about which screenings and vaccines you need and how often you need them. ?This information is not intended to replace advice given to you by your health care provider. Make sure you discuss any questions you have with your health care provider. ?Document Released: 07/24/2015 Document Revised: 03/16/2016 Document Reviewed: 04/28/2015 ?Elsevier Interactive Patient Education ? 2017 [employer] ? ?Fall Prevention in the Home ?Falls can cause injuries. They can happen to people of all ages. There are many things you can do to make your home safe and to help prevent falls. ?What can I do on the outside of my home? ?Regularly fix the edges of walkways and driveways and fix any cracks. ?Remove anything that might make you trip as you walk through a door, such as a raised step or threshold. ?Trim any bushes or trees on the path to your home. ?Use bright outdoor lighting. ?Clear any walking paths of anything that might make someone trip, such as rocks or tools. ?Regularly check to see if handrails are loose or broken. Make sure that both sides of any steps have handrails. ?Any raised decks and porches should have guardrails on the edges. ?Have any leaves, snow, or ice cleared regularly. ?Use sand or salt on walking paths during winter. ?Clean up any spills in your garage right away. This includes oil or grease spills. ?What can I do in the bathroom? ?Use night lights. ?Install grab bars by the toilet and in the tub and shower. Do not use towel bars as grab bars. ?Use non-skid mats or decals in the tub or shower. ?If you need to sit down in the shower, use a plastic, non-slip stool. ?Keep the floor dry. Clean up any water that spills on  the floor as soon as it happens. ?Remove soap buildup in the tub or shower regularly. ?Attach bath mats securely with double-sided non-slip rug tape. ?Do not have throw rugs and other things on the floor that can make you trip. ?What can I do in the bedroom? ?Use night lights. ?Make sure that you have a light by your bed that is easy to reach. ?Do not use any sheets or blankets that are too big for your bed. They should not hang down onto the floor. ?Have a firm chair that has side arms. You can use this for support while you get dressed. ?Do not have throw rugs and other things on the floor that can make you trip. ?What can I do in the kitchen? ?Clean up any spills right away. ?Avoid walking on wet floors. ?Keep items that you use a lot in easy-to-reach places. ?If you need to reach something above you, use a strong step stool that has a grab bar. ?Keep electrical cords out of the way. ?Do not use floor polish or wax that makes floors slippery. If you must use wax, use non-skid floor wax. ?Do not have throw rugs and other things on the floor that can make you trip. ?What can I do with my stairs? ?Do not leave any items on the stairs. ?Make sure that there are handrails on both sides of the stairs and use them. Fix handrails that are broken or loose. Make sure  that handrails are as long as the stairways. ?Check any carpeting to make sure that it is firmly attached to the stairs. Fix any carpet that is loose or worn. ?Avoid having throw rugs at the top or bottom of the stairs. If you do have throw rugs, attach them to the floor with carpet tape. ?Make sure that you have a light switch at the top of the stairs and the bottom of the stairs. If you do not have them, ask someone to add them for you. ?What else can I do to help prevent falls? ?Wear shoes that: ?Do not have high heels. ?Have rubber bottoms. ?Are comfortable and fit you well. ?Are closed at the toe. Do not wear sandals. ?If you use a stepladder: ?Make sure  that it is fully opened. Do not climb a closed stepladder. ?Make sure that both sides of the stepladder are locked into place. ?Ask someone to hold it for you, if possible. ?Clearly mark and make sure th

## 2021-11-20 ENCOUNTER — Encounter: Payer: Self-pay | Admitting: Internal Medicine

## 2021-11-20 NOTE — Assessment & Plan Note (Signed)
BP Readings from Last 3 Encounters:  ?11/16/21 112/76  ?11/16/21 112/76  ?11/11/20 138/76  ? ?Stable, pt to continue medical treatment zestoretic ? ?

## 2021-11-20 NOTE — Assessment & Plan Note (Signed)
Etiology unclear, asympt per pt, declines echo for now ?

## 2021-11-20 NOTE — Assessment & Plan Note (Signed)
Lab Results  ?Component Value Date  ? LDLCALC 65 11/16/2021  ? ?Stable, pt to continue current statin crestor ? ?

## 2021-11-20 NOTE — Assessment & Plan Note (Signed)

## 2021-11-20 NOTE — Assessment & Plan Note (Signed)
Lab Results  ?Component Value Date  ? HGBA1C 5.5 11/16/2021  ? ?Stable, pt to continue current medical treatment  - diet ? ?

## 2022-04-09 ENCOUNTER — Inpatient Hospital Stay (HOSPITAL_COMMUNITY)
Admission: EM | Admit: 2022-04-09 | Discharge: 2022-04-17 | DRG: 871 | Disposition: A | Discharge status: Skilled Nursing Facility | Payer: PPO | Attending: Internal Medicine | Admitting: Internal Medicine

## 2022-04-09 ENCOUNTER — Emergency Department (HOSPITAL_COMMUNITY): Payer: PPO

## 2022-04-09 ENCOUNTER — Encounter (HOSPITAL_COMMUNITY): Payer: Self-pay

## 2022-04-09 DIAGNOSIS — R2689 Other abnormalities of gait and mobility: Secondary | ICD-10-CM | POA: Diagnosis not present

## 2022-04-09 DIAGNOSIS — K5900 Constipation, unspecified: Secondary | ICD-10-CM | POA: Diagnosis not present

## 2022-04-09 DIAGNOSIS — K529 Noninfective gastroenteritis and colitis, unspecified: Secondary | ICD-10-CM

## 2022-04-09 DIAGNOSIS — E876 Hypokalemia: Secondary | ICD-10-CM | POA: Diagnosis not present

## 2022-04-09 DIAGNOSIS — Z823 Family history of stroke: Secondary | ICD-10-CM

## 2022-04-09 DIAGNOSIS — M810 Age-related osteoporosis without current pathological fracture: Secondary | ICD-10-CM | POA: Diagnosis present

## 2022-04-09 DIAGNOSIS — I214 Non-ST elevation (NSTEMI) myocardial infarction: Secondary | ICD-10-CM | POA: Diagnosis not present

## 2022-04-09 DIAGNOSIS — R571 Hypovolemic shock: Secondary | ICD-10-CM | POA: Diagnosis not present

## 2022-04-09 DIAGNOSIS — A09 Infectious gastroenteritis and colitis, unspecified: Secondary | ICD-10-CM | POA: Diagnosis present

## 2022-04-09 DIAGNOSIS — E872 Acidosis, unspecified: Secondary | ICD-10-CM | POA: Diagnosis present

## 2022-04-09 DIAGNOSIS — A419 Sepsis, unspecified organism: Secondary | ICD-10-CM | POA: Diagnosis not present

## 2022-04-09 DIAGNOSIS — Z8249 Family history of ischemic heart disease and other diseases of the circulatory system: Secondary | ICD-10-CM

## 2022-04-09 DIAGNOSIS — I471 Supraventricular tachycardia, unspecified: Secondary | ICD-10-CM | POA: Diagnosis not present

## 2022-04-09 DIAGNOSIS — I350 Nonrheumatic aortic (valve) stenosis: Secondary | ICD-10-CM | POA: Diagnosis present

## 2022-04-09 DIAGNOSIS — I7121 Aneurysm of the ascending aorta, without rupture: Secondary | ICD-10-CM | POA: Diagnosis not present

## 2022-04-09 DIAGNOSIS — I959 Hypotension, unspecified: Secondary | ICD-10-CM | POA: Diagnosis not present

## 2022-04-09 DIAGNOSIS — K6389 Other specified diseases of intestine: Secondary | ICD-10-CM | POA: Diagnosis not present

## 2022-04-09 DIAGNOSIS — I2489 Other forms of acute ischemic heart disease: Secondary | ICD-10-CM | POA: Diagnosis not present

## 2022-04-09 DIAGNOSIS — N179 Acute kidney failure, unspecified: Secondary | ICD-10-CM

## 2022-04-09 DIAGNOSIS — R531 Weakness: Secondary | ICD-10-CM

## 2022-04-09 DIAGNOSIS — E860 Dehydration: Secondary | ICD-10-CM

## 2022-04-09 DIAGNOSIS — R579 Shock, unspecified: Secondary | ICD-10-CM | POA: Diagnosis not present

## 2022-04-09 DIAGNOSIS — E861 Hypovolemia: Secondary | ICD-10-CM | POA: Diagnosis not present

## 2022-04-09 DIAGNOSIS — M6281 Muscle weakness (generalized): Secondary | ICD-10-CM | POA: Diagnosis not present

## 2022-04-09 DIAGNOSIS — K219 Gastro-esophageal reflux disease without esophagitis: Secondary | ICD-10-CM | POA: Diagnosis present

## 2022-04-09 DIAGNOSIS — L89316 Pressure-induced deep tissue damage of right buttock: Secondary | ICD-10-CM | POA: Diagnosis not present

## 2022-04-09 DIAGNOSIS — R012 Other cardiac sounds: Secondary | ICD-10-CM | POA: Diagnosis not present

## 2022-04-09 DIAGNOSIS — R197 Diarrhea, unspecified: Secondary | ICD-10-CM

## 2022-04-09 DIAGNOSIS — R6520 Severe sepsis without septic shock: Secondary | ICD-10-CM | POA: Diagnosis present

## 2022-04-09 DIAGNOSIS — I10 Essential (primary) hypertension: Secondary | ICD-10-CM | POA: Diagnosis present

## 2022-04-09 DIAGNOSIS — R911 Solitary pulmonary nodule: Secondary | ICD-10-CM | POA: Diagnosis not present

## 2022-04-09 DIAGNOSIS — K828 Other specified diseases of gallbladder: Secondary | ICD-10-CM | POA: Diagnosis not present

## 2022-04-09 DIAGNOSIS — N289 Disorder of kidney and ureter, unspecified: Secondary | ICD-10-CM | POA: Diagnosis not present

## 2022-04-09 DIAGNOSIS — M6282 Rhabdomyolysis: Secondary | ICD-10-CM | POA: Diagnosis not present

## 2022-04-09 DIAGNOSIS — R7989 Other specified abnormal findings of blood chemistry: Secondary | ICD-10-CM | POA: Diagnosis not present

## 2022-04-09 DIAGNOSIS — M2550 Pain in unspecified joint: Secondary | ICD-10-CM | POA: Diagnosis not present

## 2022-04-09 DIAGNOSIS — R7401 Elevation of levels of liver transaminase levels: Secondary | ICD-10-CM | POA: Diagnosis not present

## 2022-04-09 DIAGNOSIS — E871 Hypo-osmolality and hyponatremia: Secondary | ICD-10-CM | POA: Diagnosis not present

## 2022-04-09 DIAGNOSIS — R34 Anuria and oliguria: Secondary | ICD-10-CM

## 2022-04-09 DIAGNOSIS — E785 Hyperlipidemia, unspecified: Secondary | ICD-10-CM | POA: Diagnosis present

## 2022-04-09 DIAGNOSIS — K5289 Other specified noninfective gastroenteritis and colitis: Secondary | ICD-10-CM

## 2022-04-09 DIAGNOSIS — Z7401 Bed confinement status: Secondary | ICD-10-CM | POA: Diagnosis not present

## 2022-04-09 DIAGNOSIS — R011 Cardiac murmur, unspecified: Secondary | ICD-10-CM | POA: Diagnosis not present

## 2022-04-09 DIAGNOSIS — Z66 Do not resuscitate: Secondary | ICD-10-CM | POA: Diagnosis present

## 2022-04-09 DIAGNOSIS — D72825 Bandemia: Secondary | ICD-10-CM

## 2022-04-09 LAB — RENAL FUNCTION PANEL
Albumin: 2.9 g/dL — ABNORMAL LOW (ref 3.5–5.0)
Anion gap: 16 — ABNORMAL HIGH (ref 5–15)
BUN: 100 mg/dL — ABNORMAL HIGH (ref 8–23)
CO2: 26 mmol/L (ref 22–32)
Calcium: 8.5 mg/dL — ABNORMAL LOW (ref 8.9–10.3)
Chloride: 91 mmol/L — ABNORMAL LOW (ref 98–111)
Creatinine, Ser: 4.21 mg/dL — ABNORMAL HIGH (ref 0.44–1.00)
GFR, Estimated: 9 mL/min — ABNORMAL LOW (ref >60)
Glucose, Bld: 132 mg/dL — ABNORMAL HIGH (ref 70–99)
Phosphorus: 2.4 mg/dL — ABNORMAL LOW (ref 2.5–4.6)
Potassium: 2.7 mmol/L — CL (ref 3.5–5.1)
Sodium: 133 mmol/L — ABNORMAL LOW (ref 135–145)

## 2022-04-09 LAB — URINALYSIS, ROUTINE W REFLEX MICROSCOPIC
Bacteria, UA: NONE SEEN
Bilirubin Urine: NEGATIVE
Glucose, UA: 50 mg/dL — AB
Ketones, ur: NEGATIVE mg/dL
Nitrite: NEGATIVE
Protein, ur: 100 mg/dL — AB
Specific Gravity, Urine: 1.016 (ref 1.005–1.030)
pH: 5 (ref 5.0–8.0)

## 2022-04-09 LAB — HEPARIN LEVEL (UNFRACTIONATED)
Heparin Unfractionated: <0.1 IU/mL — ABNORMAL LOW (ref 0.30–0.70)
Heparin Unfractionated: 0.66 IU/mL (ref 0.30–0.70)

## 2022-04-09 LAB — CBC WITH DIFFERENTIAL/PLATELET
Abs Immature Granulocytes: 0.3 10*3/uL — ABNORMAL HIGH (ref 0.00–0.07)
Basophils Absolute: 0.1 10*3/uL (ref 0.0–0.1)
Basophils Relative: 0 %
Eosinophils Absolute: 0.1 10*3/uL (ref 0.0–0.5)
Eosinophils Relative: 0 %
HCT: 41.9 % (ref 36.0–46.0)
Hemoglobin: 15.1 g/dL — ABNORMAL HIGH (ref 12.0–15.0)
Immature Granulocytes: 1 %
Lymphocytes Relative: 8 %
Lymphs Abs: 2.8 10*3/uL (ref 0.7–4.0)
MCH: 31.1 pg (ref 26.0–34.0)
MCHC: 36 g/dL (ref 30.0–36.0)
MCV: 86.4 fL (ref 80.0–100.0)
Monocytes Absolute: 1.5 10*3/uL — ABNORMAL HIGH (ref 0.1–1.0)
Monocytes Relative: 4 %
Neutro Abs: 29 10*3/uL — ABNORMAL HIGH (ref 1.7–7.7)
Neutrophils Relative %: 87 %
Platelets: 205 10*3/uL (ref 150–400)
RBC: 4.85 MIL/uL (ref 3.87–5.11)
RDW: 12.5 % (ref 11.5–15.5)
WBC: 33.7 10*3/uL — ABNORMAL HIGH (ref 4.0–10.5)
nRBC: 0 % (ref 0.0–0.2)

## 2022-04-09 LAB — MAGNESIUM: Magnesium: 2.9 mg/dL — ABNORMAL HIGH (ref 1.7–2.4)

## 2022-04-09 LAB — TROPONIN I (HIGH SENSITIVITY)
Troponin I (High Sensitivity): 18829 ng/L (ref <18)
Troponin I (High Sensitivity): 16633 ng/L (ref <18)

## 2022-04-09 LAB — LACTIC ACID, PLASMA
Lactic Acid, Venous: 2.5 mmol/L (ref 0.5–1.9)
Lactic Acid, Venous: 3.7 mmol/L (ref 0.5–1.9)
Lactic Acid, Venous: 3.5 mmol/L (ref 0.5–1.9)

## 2022-04-09 LAB — APTT: aPTT: 20 seconds — ABNORMAL LOW (ref 24–36)

## 2022-04-09 LAB — MRSA NEXT GEN BY PCR, NASAL: MRSA by PCR Next Gen: NOT DETECTED

## 2022-04-09 LAB — C DIFFICILE QUICK SCREEN W PCR REFLEX
C Diff antigen: NEGATIVE
C Diff interpretation: NOT DETECTED
C Diff toxin: NEGATIVE

## 2022-04-09 LAB — CK: Total CK: 5012 U/L — ABNORMAL HIGH (ref 38–234)

## 2022-04-09 LAB — PROTIME-INR
INR: 1 (ref 0.8–1.2)
Prothrombin Time: 13 seconds (ref 11.4–15.2)

## 2022-04-09 MED ORDER — ACETAMINOPHEN 650 MG RE SUPP: 650.0000 mg | Freq: Four times a day (QID) | RECTAL | Status: DC | PRN

## 2022-04-09 MED ORDER — LACTATED RINGERS IV BOLUS
500.0000 mL | Freq: Once | INTRAVENOUS | Status: AC
  Administered 2022-04-09: 500 mL via INTRAVENOUS

## 2022-04-09 MED ORDER — SODIUM CHLORIDE 0.9 % IV SOLN
250.0000 mL | INTRAVENOUS | Status: DC
  Administered 2022-04-09: 250 mL via INTRAVENOUS

## 2022-04-09 MED ORDER — ACETAMINOPHEN 325 MG PO TABS: 650.0000 mg | ORAL_TABLET | Freq: Four times a day (QID) | ORAL | Status: DC | PRN

## 2022-04-09 MED ORDER — POTASSIUM CHLORIDE 10 MEQ/100ML IV SOLN
10.0000 meq | INTRAVENOUS | Status: AC
  Administered 2022-04-09 – 2022-04-10 (×3): 10 meq via INTRAVENOUS
  Filled 2022-04-09 (×3): qty 100

## 2022-04-09 MED ORDER — ONDANSETRON HCL 4 MG PO TABS: 4.0000 mg | ORAL_TABLET | Freq: Four times a day (QID) | ORAL | Status: DC | PRN

## 2022-04-09 MED ORDER — LACTATED RINGERS IV BOLUS
1000.0000 mL | Freq: Once | INTRAVENOUS | Status: AC
  Administered 2022-04-09: 1000 mL via INTRAVENOUS

## 2022-04-09 MED ORDER — VANCOMYCIN VARIABLE DOSE PER UNSTABLE RENAL FUNCTION (PHARMACIST DOSING): Status: DC

## 2022-04-09 MED ORDER — HEPARIN (PORCINE) 25000 UT/250ML-% IV SOLN
600.0000 [IU]/h | INTRAVENOUS | Status: DC
  Administered 2022-04-09: 600 [IU]/h via INTRAVENOUS
  Filled 2022-04-09: qty 250

## 2022-04-09 MED ORDER — LACTATED RINGERS IV SOLN: INTRAVENOUS | Status: AC

## 2022-04-09 MED ORDER — ORAL CARE MOUTH RINSE: 15.0000 mL | OROMUCOSAL | Status: DC | PRN

## 2022-04-09 MED ORDER — LACTATED RINGERS IV BOLUS (SEPSIS)
1000.0000 mL | Freq: Once | INTRAVENOUS | Status: AC
  Administered 2022-04-09: 1000 mL via INTRAVENOUS

## 2022-04-09 MED ORDER — POTASSIUM CHLORIDE 10 MEQ/100ML IV SOLN
10.0000 meq | INTRAVENOUS | Status: AC
  Administered 2022-04-09 (×6): 10 meq via INTRAVENOUS
  Filled 2022-04-09 (×6): qty 100

## 2022-04-09 MED ORDER — ONDANSETRON HCL 4 MG/2ML IJ SOLN: 4.0000 mg | Freq: Four times a day (QID) | INTRAMUSCULAR | Status: DC | PRN

## 2022-04-09 MED ORDER — METRONIDAZOLE 500 MG/100ML IV SOLN
500.0000 mg | Freq: Two times a day (BID) | INTRAVENOUS | Status: DC
  Administered 2022-04-10 – 2022-04-12 (×5): 500 mg via INTRAVENOUS
  Filled 2022-04-09 (×5): qty 100

## 2022-04-09 MED ORDER — MIDODRINE HCL 5 MG PO TABS
5.0000 mg | ORAL_TABLET | Freq: Three times a day (TID) | ORAL | Status: DC
  Administered 2022-04-09 – 2022-04-16 (×21): 5 mg via ORAL
  Filled 2022-04-09 (×22): qty 1

## 2022-04-09 MED ORDER — HEPARIN BOLUS VIA INFUSION
3000.0000 [IU] | Freq: Once | INTRAVENOUS | Status: AC
  Administered 2022-04-09: 3000 [IU] via INTRAVENOUS
  Filled 2022-04-09: qty 3000

## 2022-04-09 MED ORDER — VANCOMYCIN HCL IN DEXTROSE 1-5 GM/200ML-% IV SOLN
1000.0000 mg | Freq: Once | INTRAVENOUS | Status: AC
  Administered 2022-04-09: 1000 mg via INTRAVENOUS
  Filled 2022-04-09: qty 200

## 2022-04-09 MED ORDER — SODIUM CHLORIDE 0.9 % IV SOLN
2.0000 g | Freq: Once | INTRAVENOUS | Status: AC
  Administered 2022-04-09: 2 g via INTRAVENOUS
  Filled 2022-04-09: qty 12.5

## 2022-04-09 MED ORDER — NOREPINEPHRINE 4 MG/250ML-% IV SOLN
2.0000 ug/min | INTRAVENOUS | Status: DC
  Administered 2022-04-09: 2 ug/min via INTRAVENOUS
  Administered 2022-04-10: 6 ug/min via INTRAVENOUS
  Administered 2022-04-11: 4 ug/min via INTRAVENOUS
  Filled 2022-04-09 (×3): qty 250

## 2022-04-09 MED ORDER — CHLORHEXIDINE GLUCONATE CLOTH 2 % EX PADS
6.0000 | MEDICATED_PAD | Freq: Every day | CUTANEOUS | Status: DC
  Administered 2022-04-09 – 2022-04-11 (×3): 6 via TOPICAL

## 2022-04-09 MED ORDER — POTASSIUM CHLORIDE CRYS ER 20 MEQ PO TBCR
40.0000 meq | EXTENDED_RELEASE_TABLET | Freq: Once | ORAL | Status: AC
  Administered 2022-04-09: 40 meq via ORAL
  Filled 2022-04-09: qty 2

## 2022-04-09 MED ORDER — METRONIDAZOLE 500 MG/100ML IV SOLN
500.0000 mg | Freq: Once | INTRAVENOUS | Status: AC
  Administered 2022-04-09: 500 mg via INTRAVENOUS
  Filled 2022-04-09: qty 100

## 2022-04-09 MED ORDER — SODIUM CHLORIDE 0.9 % IV SOLN
1.0000 g | INTRAVENOUS | Status: DC
  Administered 2022-04-10 – 2022-04-11 (×2): 1 g via INTRAVENOUS
  Filled 2022-04-09 (×2): qty 10

## 2022-04-09 NOTE — Progress Notes (Signed)
04/09/2022 Contacted patient's next of kin, nephew Norman Herrlich, by phone, per request of Dr. Josephine Cables. Explained that patient continues to have low blood pressure in spite of fluid boluses and as a result may need to receive pressors via peripheral IV. Patient is willing to receive this treatment and after discussion with nephew he is also in agreement. Discussed with charge nurse, Lorretta Harp also. Cindy S. Brigitte Pulse BSN, RN, [place] 04/09/2022 10:28 PM

## 2022-04-09 NOTE — ED Triage Notes (Signed)
Pt arrives via [facility] from home for weakness and diarrhea. Pt also found to be hypotensive by EMS with SBP in the 70's. Improved to 80's after 500 mL NS BOLUS

## 2022-04-09 NOTE — Sepsis Progress Note (Addendum)
[facility]link following code sepsis  1840 bedside RN messaged asking if they could make sure lab draws 3rd lactic acid

## 2022-04-09 NOTE — Progress Notes (Addendum)
RN called due to patient's BP in hypotensive range-85/47 with a MAP of 58, chart was reviewed and it was noted that patient already received almost 3 L of IV fluid (weight 53.1 kg) while on maintenance drip at 150 ml per hour.  An additional 500 mL bolus was given without improvement in BP.  Patient's nephew agreed to peripheral pressors.  She was started on IV Levophed, potassium was replenished. Patient met SIRS criteria (with suspicion for sepsis) due to leukocytosis and hypothermia Lactic acidosis 2.5 > 3.7 > 3.5.  This may be multifactorial, considering rhabdomyolysis (total CK 5012), dehydration, AKI/azotemia and possible infectious process (blood culture, C. difficile and GI stool panel pending.  Procalcitonin will be checked).  She was already empirically started on IV cefepime, Flagyl and vancomycin Continue IV hydration and continue to trend lactic acid and total CK. Please refer to admission H&P for details regarding the care of this patient  

## 2022-04-09 NOTE — Progress Notes (Signed)
Pharmacy Antibiotic Note  Gloria [name] is a 88 y.o. female admitted on 04/09/2022 with sepsis.  Pharmacy has been consulted for vancomycin and cefepime dosing.  Plan: Cefepime 1g IV q24h + Flagyl per MD Vancomycin 1g IV x 1, plan to check level in ~48hr and redose when < 15 mcg/ml Follow up renal function & cultures  Height: 5\' 3"  (160 cm) Weight: 53.1 kg (117 lb) IBW/kg (Calculated) : 52.4  Temp (24hrs), Avg:97.9 F (36.6 C), Min:97.7 F (36.5 C), Max:98.1 F (36.7 C)  Recent Labs  Lab 04/09/22 1155 04/09/22 1212 04/09/22 1400  WBC  --  33.7*  --   CREATININE  --  4.46*  --   LATICACIDVEN 2.5*  --  3.7*    Estimated Creatinine Clearance: 6.5 mL/min (A) (by C-G formula based on SCr of 4.46 mg/dL (H)).    No Known Allergies  Antimicrobials this admission:  9/30 Vanc >> 9/30 Cefepime >> 9/30 Flagyl >>  Dose adjustments this admission:   Microbiology results:  9/30 BCx: 9/30 C.diff: 9/30 GI panel:  Thank you for allowing pharmacy to be a part of this patient's care.  Peggyann Juba, PharmD, BCPS Pharmacy: [phone] 04/09/2022 5:16 PM

## 2022-04-09 NOTE — Progress Notes (Signed)
A consult was received from an ED physician for vancomycin and cefepime per pharmacy dosing.  The patient's profile has been reviewed for ht/wt/allergies/indication/available labs.   A one time order has been placed for vancomycin 1g and cefepime 2g.  Further antibiotics/pharmacy consults should be ordered by admitting physician if indicated.                       Thank you, Peggyann Juba, PharmD, BCPS 04/09/2022  2:10 PM

## 2022-04-09 NOTE — Progress Notes (Signed)
ANTICOAGULATION CONSULT NOTE - Initial Consult  Pharmacy Consult for Heparin Indication: chest pain/ACS  No Known Allergies  Patient Measurements: Height: 5\' 3"  (160 cm) Weight: 53.1 kg (117 lb) IBW/kg (Calculated) : 52.4 Heparin Dosing Weight: actual  Vital Signs: Temp: 98.1 F (36.7 C) (09/30 1121) Temp Source: Oral (09/30 1121) BP: 97/61 (09/30 1400) Pulse Rate: 87 (09/30 1400)  Labs: Recent Labs    04/09/22 1212  HGB 15.1*  HCT 41.9  PLT 205  LABPROT 13.0  INR 1.0  CREATININE 4.46*  TROPONINIHS 18,829*    Estimated Creatinine Clearance: 6.5 mL/min (A) (by C-G formula based on SCr of 4.46 mg/dL (H)).   Medical History: Past Medical History:  Diagnosis Date   GERD 04/10/2007   Qualifier: Diagnosis of  By: Elveria Royals    HYPERLIPIDEMIA 04/13/2007   Qualifier: Diagnosis of  By: Jenny Reichmann MD, Hunt Oris    HYPERTENSION 04/10/2007   Qualifier: Diagnosis of  By: Elveria Royals    Impaired glucose tolerance 09/17/2011   OSTEOPOROSIS 04/10/2007   Qualifier: Diagnosis of  By: Elveria Royals    Unspecified vitamin D deficiency 08/26/2009   Centricity Description: UNSPECIFIED VITAMIN D DEFICIENCY Qualifier: Diagnosis of  By: Jenny Reichmann MD, Hunt Oris  Centricity Description: VITAMIN D DEFICIENCY Qualifier: Diagnosis of  By: Jenny Reichmann MD, Hunt Oris     Medications:  Scheduled:   heparin  3,000 Units Intravenous Once   Infusions:   ceFEPime (MAXIPIME) IV 2 g (04/09/22 1421)   heparin     lactated ringers     metronidazole     potassium chloride 10 mEq (04/09/22 1420)   vancomycin     PRN:   Assessment: 88 yo female presents with fatigue and diarrhea.  Pharmacy consulted to dose IV heparin for elevated troponin, rule out MI. CBC wnl, no anticoagulants PTA.  Goal of Therapy:  Heparin level 0.3-0.7 units/ml Monitor platelets by anticoagulation protocol: Yes   Plan:  Give 3000 units bolus x 1 Start heparin infusion at 600 units/hr Check anti-Xa level in 8  hours and daily while on heparin Continue to monitor H&H and platelets  Peggyann Juba, PharmD, BCPS Pharmacy: [phone] 04/09/2022,2:27 PM

## 2022-04-09 NOTE — Progress Notes (Signed)
ANTICOAGULATION CONSULT NOTE    Pharmacy Consult for Heparin Indication: chest pain/ACS  No Known Allergies  Patient Measurements: Height: 5\' 3"  (160 cm) Weight: 53.1 kg (117 lb) IBW/kg (Calculated) : 52.4 Heparin Dosing Weight: actual  Vital Signs: Temp: 97.2 F (36.2 C) (09/30 1935) Temp Source: Oral (09/30 1935) BP: 92/47 (09/30 2330) Pulse Rate: 82 (09/30 2330)  Labs: Recent Labs    04/09/22 1212 04/09/22 1413 04/09/22 1427 04/09/22 1428 04/09/22 1602 04/09/22 1932 04/09/22 2250  HGB 15.1*  --   --   --   --   --   --   HCT 41.9  --   --   --   --   --   --   PLT 205  --   --   --   --   --   --   APTT  --   --  20*  --   --   --   --   LABPROT 13.0  --   --   --   --   --   --   INR 1.0  --   --   --   --   --   --   HEPARINUNFRC  --   --   --  <0.10*  --   --  0.66  CREATININE 4.46*  --   --   --   --  4.21*  --   CKTOTAL  --   --   --   --  5,012*  --   --   GDJMEQASTMH 96,222* 97,989*  --   --   --   --   --      Estimated Creatinine Clearance: 6.9 mL/min (A) (by C-G formula based on SCr of 4.21 mg/dL (H)).   Medical History: Past Medical History:  Diagnosis Date   GERD 04/10/2007   Qualifier: Diagnosis of  By: Elveria Royals    HYPERLIPIDEMIA 04/13/2007   Qualifier: Diagnosis of  By: Jenny Reichmann MD, Hunt Oris    HYPERTENSION 04/10/2007   Qualifier: Diagnosis of  By: Elveria Royals    Impaired glucose tolerance 09/17/2011   OSTEOPOROSIS 04/10/2007   Qualifier: Diagnosis of  By: Elveria Royals    Unspecified vitamin D deficiency 08/26/2009   Centricity Description: UNSPECIFIED VITAMIN D DEFICIENCY Qualifier: Diagnosis of  By: Jenny Reichmann MD, Hunt Oris  Centricity Description: VITAMIN D DEFICIENCY Qualifier: Diagnosis of  By: Jenny Reichmann MD, Hunt Oris     Medications:  Scheduled:   Chlorhexidine Gluconate Cloth  6 each Topical Daily   midodrine  5 mg Oral TID WC   vancomycin variable dose per unstable renal function (pharmacist dosing)   Does not apply  See admin instructions   Infusions:   sodium chloride 250 mL (04/09/22 2319)   [START ON 04/10/2022] ceFEPime (MAXIPIME) IV     heparin 600 Units/hr (04/09/22 2200)   lactated ringers 150 mL/hr at 04/09/22 2200   [START ON 04/10/2022] metronidazole     norepinephrine (LEVOPHED) Adult infusion 2 mcg/min (04/09/22 2257)   potassium chloride 10 mEq (04/09/22 2310)   PRN:   Assessment: 88 yo female presents with fatigue and diarrhea.  Pharmacy consulted to dose IV heparin for elevated troponin, rule out MI. CBC wnl, no anticoagulants PTA.  2nd shift follow-up: Heparin level = 0.66 (therapeutic) with heparin gtt @ 600 units/hr No complications of therapy noted  Goal of Therapy:  Heparin level 0.3-0.7 units/ml Monitor platelets by anticoagulation protocol: Yes  Plan:  Continue heparin gtt @ 600 units/hr Check heparin level with AM labs to confirm therapeutic dose Daily heparin level and CBC Monitor for signs & symptoms of bleeding  Terrilee Files, PharmD 04/09/2022,11:44 PM

## 2022-04-09 NOTE — H&P (Signed)
History and Physical    Patient: Gloria [name] QMV:[id number] DOB: [birth year] DOA: 04/09/2022 DOS: the patient was seen and examined on 04/09/2022 PCP: [name], MD  Patient coming from: Home.  Lives alone.  Very independent and active at baseline.  Chief Complaint:  Chief Complaint  Patient presents with   Hypotension   Diarrhea   HPI: Gloria [name] is a 88 y.o. female with PMH of essential HTN, AAA, heart murmur, GERD and osteoporosis brought to ED by EMS due to weakness and diarrhea.  History provided by patient and patient's nephew, Gloria [name] who is HCPOA.  Patient reports 4 to 5 days of diarrhea and weakness.  She describes the diarrhea as stool leaking and countless.  Denies blood in the stool.  Denies nausea, vomiting, abdominal pain, fever, chills, chest pain, dyspnea, cough, dysuria, frequency or urgency.  Denies focal weakness, numbness or tingling.  Denies new medication or new fluid.  Denies a skin break or wound anywhere.  Has not taken her blood pressure medication in 1 to 2 days.  She refused to come to the hospital when a family member checked on her yesterday.  She was persistently weak with ongoing diarrhea, and family members decided to call EMS.  Per EMS report to ED, SBP in 70s but improved to 80s after 500 cc NS bolus.  Patient lives alone.  Denies smoking cigarette, drinking alcohol recreational drug use.  Prefers to to be DNR.  In ED, SBP ranges from 80s to 90s.  DBP ranges from 40s to 60s.  Other vital stable.  100% on RA.  WBC 33 with left shift.  Hgb 15.  Lactic acid 2.5>> 3.7 after a liter of LR bolus.  K <2.0. Cr 4.46.  BUN 109.  AG 18.  Bicarb 31.  AST 128.  Troponin 18,800>> 16,600.  EKG NSR without acute ischemic finding.  CXR showed 3 nodular opacities measuring up to 5 mm in RUL.  UA with moderate Hgb and small LE.  Coag labs normal.  CT abdomen and pelvis raises concern for enteritis, stercolits, moderate to large stool burden, 2.4 cm fluid  density in right pelvis, biliary sludge, lingular bronchiectasis with interstitial marking.   Patient received 2 L of fluid boluses.  Blood culture ordered.  C. difficile and GIP ordered.  Started on cefepime, vancomycin, Flagyl, IV heparin, IV KCl and maintenance IV fluid.  Per EP, cardiology recommended medicine admission to see patient in consult.  Review of Systems: As mentioned in the history of present illness. All other systems reviewed and are negative. Past Medical History:  Diagnosis Date   GERD 04/10/2007   Qualifier: Diagnosis of  By: Maris Berger    HYPERLIPIDEMIA 04/13/2007   Qualifier: Diagnosis of  By: Jonny Ruiz MD, Len Blalock    HYPERTENSION 04/10/2007   Qualifier: Diagnosis of  By: Maris Berger    Impaired glucose tolerance 09/17/2011   OSTEOPOROSIS 04/10/2007   Qualifier: Diagnosis of  By: Maris Berger    Unspecified vitamin D deficiency 08/26/2009   Centricity Description: UNSPECIFIED VITAMIN D DEFICIENCY Qualifier: Diagnosis of  By: Jonny Ruiz MD, Len Blalock  Centricity Description: VITAMIN D DEFICIENCY Qualifier: Diagnosis of  By: Jonny Ruiz MD, Len Blalock    Past Surgical History:  Procedure Laterality Date   CATARACT EXTRACTION     Social History:  reports that she has never smoked. She has never used smokeless tobacco. She reports that she does not drink alcohol and does not use drugs.  No Known Allergies  Family History  Problem Relation Age of Onset   Heart disease Father    Stroke Sister     Prior to Admission medications   Medication Sig Start Date End Date Taking? Authorizing Provider  Cholecalciferol (THERA-D 2000) 50 MCG (2000 UT) TABS 1 tab by mouth once daily 11/11/20  Yes [name], MD  lisinopril-hydrochlorothiazide (ZESTORETIC) 20-12.5 MG tablet Take 1 tablet by mouth daily. 11/16/21  Yes [name], MD  rosuvastatin (CRESTOR) 40 MG tablet TAKE ONE TABLET BY MOUTH DAILY Patient taking differently: Take 40 mg by mouth daily. 11/16/21  Yes  [name], MD    Physical Exam: Vitals:   04/09/22 1600 04/09/22 1615 04/09/22 1630 04/09/22 1645  BP: (!) 79/59 (!) 86/64 (!) 87/58 (!) 90/56  Pulse:    83  Resp: 19 19 (!) 21 18  Temp:      TempSrc:      SpO2:    96%  Weight:      Height:       GENERAL: No apparent distress.  Nontoxic. HEENT: MMM.  Vision grossly intact.  Slightly diminished hearing. NECK: Supple.  No apparent JVD.  RESP:  No IWOB.  Fair aeration bilaterally. CVS:  RRR. Heart sounds normal.  ABD/GI/GU: BS+. Abd soft, NTND.  MSK/EXT:  Moves extremities. No apparent deformity. No edema.  SKIN: no apparent skin lesion or wound NEURO: Awake and alert. Oriented appropriately.  No apparent focal neuro deficit. PSYCH: Calm. Normal affect.  Data Reviewed: See HPI  Assessment and Plan: Principal Problem:   Non-STEMI (non-ST elevated myocardial infarction) (HCC) Active Problems:   Essential hypertension   GERD   Heart murmur   Ascending aortic aneurysm (HCC)   AKI (acute kidney injury) (HCC)   Azotemia   Generalized weakness   Bandemia   Hypokalemia   Elevated AST (SGOT)   Diarrhea   Enteritis   Constipation   Stercoral colitis   Dehydration   Hypotension   Lactic acidosis   Non-STEMI: Markedly elevated troponin but no chest pain or signs of ischemia on EKG.  She is also severely dehydrated with severe AKI which might be contributing.  She is not in distress. -Cardiology to see patient in consultation. -Continue IV heparin -Echocardiogram -Continue broad-spectrum antibiotics and IV fluid  AKI/azotemia: Likely prerenal in the setting of GI loss and concurrent use of ACE inhibitors and HCTZ.  No signs of obstruction on CT Recent Labs    11/16/21 0903 04/09/22 1212  BUN 14 109*  CREATININE 0.75 4.46*  -Hold HCTZ and ACE inhibitor's -Continue IV fluid -Strict intake and output -Recheck BMP tonight -Continue broad-spectrum antibiotics for possible infection -Check CK  Hypokalemia: Likely  due to HCTZ.  Mg 2.9. -IV KCl 10x6 ordered in ED -Recheck BMP tonight  Generalized weakness-in the setting of dehydration, diarrhea, AKI, azotemia... Patient is very independent and active at baseline -PT/OT eval  Dehydration/hypotension -IV fluid hydration -Hold antihypertensive meds  Diarrhea/enteritis/possible stercolitis/constipation -May need aggressive bowel regimen once stabilized -Continue antibiotics -Follow C. difficile and GIP  Abnormal CT-some concern about lung nodules in RUL. -Outpatient follow-up  Heart murmur: Not needed. -Echocardiogram as above  Goal of care discussion: Discussed CODE STATUS including pros and cons of CPR and intubation.  Patient prefers to be DNR.  Nephew, HCPOA in agreement.  Bandemia: Severe leukocytosis with left shift but no fever or clear source of infection except for possible enteritis and stercolitis.  She has no respiratory symptoms to  suggest pneumonia despite CT finding.  Denies UTI symptoms.  UA not convincing either.  Cannot exclude bacteremia but she is well-appearing clinically -Continue broad-spectrum antibiotics -Follow cultures -Trend leukocytosis  Lactic acidosis: Multifactorial including dehydration, AKI, poor perfusion or possible sepsis. -Continue trending -Continue IV fluid -Broad-spectrum antibiotics    Advance Care Planning:   Code Status: DNR   Consults: Cardiology  Family Communication: Updated patient's nephew. Lanny Hurst at bedside  Severity of Illness: The appropriate patient status for this patient is INPATIENT. Inpatient status is judged to be reasonable and necessary in order to provide the required intensity of service to ensure the patient's safety. The patient's presenting symptoms, physical exam findings, and initial radiographic and laboratory data in the context of their chronic comorbidities is felt to place them at high risk for further clinical deterioration. Furthermore, it is not anticipated that the  patient will be medically stable for discharge from the hospital within 2 midnights of admission.   * I certify that at the point of admission it is my clinical judgment that the patient will require inpatient hospital care spanning beyond 2 midnights from the point of admission due to high intensity of service, high risk for further deterioration and high frequency of surveillance required.*  Author: Mercy Riding, MD 04/09/2022 5:02 PM  For on call review [email or web address].

## 2022-04-09 NOTE — ED Provider Notes (Signed)
[facility]   CSN: [id number] Arrival date & time: 04/09/22  1111     History  Chief Complaint  Patient presents with   Hypotension   Diarrhea    Gloria [name] is a 88 y.o. female.  HPI Patient presents for fatigue and diarrhea.  Medical history includes HLD, HTN, GERD, osteoporosis, AAA.  Her medications include lisinopril and HCTZ.  She did not take her medications this morning.  Patient has had ongoing fatigue over the past 2 weeks.  Over the past 2 days, she has developed persistent watery diarrhea.  She currently lives independently.  When her nephew came and checked on her yesterday, he tried to get her to the ED for evaluation.  She refused at that time.  This morning, he was persistent and patient agreed.  Patient currently denies any areas of discomfort.    Home Medications Prior to Admission medications   Medication Sig Start Date End Date Taking? Authorizing Provider  Cholecalciferol (THERA-D 2000) 50 MCG (2000 UT) TABS 1 tab by mouth once daily 11/11/20  Yes Corwin LevinsJohn, [name], MD  lisinopril-hydrochlorothiazide (ZESTORETIC) 20-12.5 MG tablet Take 1 tablet by mouth daily. 11/16/21  Yes Corwin LevinsJohn, [name], MD  rosuvastatin (CRESTOR) 40 MG tablet TAKE ONE TABLET BY MOUTH DAILY Patient taking differently: Take 40 mg by mouth daily. 11/16/21  Yes Corwin LevinsJohn, [name], MD      Allergies    Patient has no known allergies.    Review of Systems   Review of Systems  Constitutional:  Positive for fatigue.  Gastrointestinal:  Positive for diarrhea.  Neurological:  Positive for weakness (Generalized).  All other systems reviewed and are negative.   Physical Exam Updated Vital Signs BP 98/62   Pulse 85   Temp 97.7 F (36.5 C) (Axillary)   Resp 14   Ht 5\' 3"  (1.6 m)   Wt 53.1 kg   SpO2 97%   BMI 20.73 kg/m  Physical Exam Vitals and nursing note reviewed.  Constitutional:      General: She is not in acute distress.    Appearance:  Normal appearance. She is well-developed. She is not ill-appearing, toxic-appearing or diaphoretic.  HENT:     Head: Normocephalic and atraumatic.     Right Ear: External ear normal.     Left Ear: External ear normal.     Nose: Nose normal.     Mouth/Throat:     Mouth: Mucous membranes are moist.     Pharynx: Oropharynx is clear.  Eyes:     Extraocular Movements: Extraocular movements intact.     Conjunctiva/sclera: Conjunctivae normal.  Cardiovascular:     Rate and Rhythm: Normal rate and regular rhythm.     Heart sounds: Murmur heard.  Pulmonary:     Effort: Pulmonary effort is normal. No respiratory distress.     Breath sounds: Normal breath sounds. No wheezing or rales.  Abdominal:     General: There is no distension.     Palpations: Abdomen is soft.     Tenderness: There is no abdominal tenderness.  Musculoskeletal:        General: No swelling. Normal range of motion.     Cervical back: Normal range of motion and neck supple.     Right lower leg: No edema.     Left lower leg: No edema.  Skin:    General: Skin is warm and dry.     Coloration: Skin is not jaundiced or pale.  Neurological:     General: No focal deficit present.     Mental Status: She is alert and oriented to person, place, and time.     Cranial Nerves: No cranial nerve deficit.     Sensory: No sensory deficit.     Motor: No weakness.     Coordination: Coordination normal.  Psychiatric:        Mood and Affect: Mood normal.        Behavior: Behavior normal.        Thought Content: Thought content normal.        Judgment: Judgment normal.     ED Results / Procedures / Treatments   Labs (all labs ordered are listed, but only abnormal results are displayed) Labs Reviewed  LACTIC ACID, PLASMA - Abnormal; Notable for the following components:      Result Value   Lactic Acid, Venous 2.5 (*)    All other components within normal limits  LACTIC ACID, PLASMA - Abnormal; Notable for the following  components:   Lactic Acid, Venous 3.7 (*)    All other components within normal limits  COMPREHENSIVE METABOLIC PANEL - Abnormal; Notable for the following components:   Potassium >2.0 (*)    Chloride 86 (*)    Glucose, Bld 162 (*)    BUN 109 (*)    Creatinine, Ser 4.46 (*)    AST 128 (*)    Total Bilirubin 2.2 (*)    GFR, Estimated 9 (*)    Anion gap 18 (*)    All other components within normal limits  CBC WITH DIFFERENTIAL/PLATELET - Abnormal; Notable for the following components:   WBC 33.7 (*)    Hemoglobin 15.1 (*)    Neutro Abs 29.0 (*)    Monocytes Absolute 1.5 (*)    Abs Immature Granulocytes 0.30 (*)    All other components within normal limits  URINALYSIS, ROUTINE W REFLEX MICROSCOPIC - Abnormal; Notable for the following components:   Color, Urine AMBER (*)    APPearance CLOUDY (*)    Glucose, UA 50 (*)    Hgb urine dipstick MODERATE (*)    Protein, ur 100 (*)    Leukocytes,Ua SMALL (*)    All other components within normal limits  MAGNESIUM - Abnormal; Notable for the following components:   Magnesium 2.9 (*)    All other components within normal limits  APTT - Abnormal; Notable for the following components:   aPTT 20 (*)    All other components within normal limits  HEPARIN LEVEL (UNFRACTIONATED) - Abnormal; Notable for the following components:   Heparin Unfractionated <0.10 (*)    All other components within normal limits  TROPONIN I (HIGH SENSITIVITY) - Abnormal; Notable for the following components:   Troponin I (High Sensitivity) 18,829 (*)    All other components within normal limits  TROPONIN I (HIGH SENSITIVITY) - Abnormal; Notable for the following components:   Troponin I (High Sensitivity) 16,633 (*)    All other components within normal limits  CULTURE, BLOOD (ROUTINE X 2)  CULTURE, BLOOD (ROUTINE X 2)  C DIFFICILE QUICK SCREEN W PCR REFLEX    GASTROINTESTINAL PANEL BY PCR, STOOL (REPLACES STOOL CULTURE)  PROTIME-INR  HEPARIN LEVEL  (UNFRACTIONATED)  CK    EKG EKG Interpretation  Date/Time:  Saturday April 09 2022 14:05:29 EDT Ventricular Rate:  83 PR Interval:  137 QRS Duration: 93 QT Interval:  417 QTC Calculation: 490 R Axis:   2 Text Interpretation: Sinus rhythm Atrial premature complexes  RSR' in V1 or V2, right VCD or RVH Repol abnrm suggests ischemia, lateral leads Confirmed by Gloris Manchester [phone]) on 04/09/2022 4:24:36 PM  Radiology CT ABDOMEN PELVIS WO CONTRAST  Result Date: 04/09/2022 CLINICAL DATA:  Abdominal pain, diarrhea EXAM: CT ABDOMEN AND PELVIS WITHOUT CONTRAST TECHNIQUE: Multidetector CT imaging of the abdomen and pelvis was performed following the standard protocol without IV contrast. RADIATION DOSE REDUCTION: This exam was performed according to the departmental dose-optimization program which includes automated exposure control, adjustment of the mA and/or kV according to patient size and/or use of iterative reconstruction technique. COMPARISON:  None Available. FINDINGS: Lower chest: There is ectasia of bronchi in lingula. There are small scattered foci of increased interstitial markings in both lower lung fields. There is 11 mm alveolar density in lingula. There are other smaller scattered alveolar densities in both lower lung fields. Coronary artery calcifications are seen. Hepatobiliary: No focal abnormalities are seen in liver. There is no dilation of bile ducts. Gallbladder is distended. There is no wall thickening in gallbladder. Possible sludge is seen in the lumen of gallbladder. Pancreas: No focal abnormalities are seen. Spleen: Unremarkable. Adrenals/Urinary Tract: Adrenals are not enlarged. There is no hydronephrosis. There are no renal or ureteral stones. Urinary bladder is empty. Stomach/Bowel: Stomach is unremarkable. Small bowel loops are not dilated. There is mild diffuse wall thickening in distal small bowel loops. Appendix is not dilated. Moderate to large amount of stool is seen in  rectum. There is mild diffuse wall thickening in rectum. There is stranding in the fat planes around the rectum. Vascular/Lymphatic: Extensive arterial calcifications are seen. Reproductive: Uterus is not distinctly visualized. In image 61 of series 2, there is 2.4 cm fluid density structure in the right side of pelvis. Other: There is no ascites or pneumoperitoneum. Small umbilical hernia containing fat is seen. Small right inguinal hernia containing fat is seen. Musculoskeletal: There is 50% decrease in height of body of L1 vertebra. IMPRESSION: There is moderate to large amount of stool is seen in the lumen of rectum. There is mild diffuse wall thickening in rectum along with perirectal stranding. Possibility of stercoral colitis is not excluded. There is mild diffuse wall thickening in few distal small bowel loops in right lower quadrant suggesting possible nonspecific inflammatory or infectious enteritis. There is no evidence of intestinal obstruction or pneumoperitoneum. There is no hydronephrosis. Appendix is not dilated. There is 2.4 cm smooth marginated fluid density structure in the right side of pelvis which may be ovarian cyst or an artifact due to partial volume averaging of fluid in partly contracted bowel loop. Follow-up pelvic sonogram in 3 months may be considered. There is subtle increased density in the dependent portion of gallbladder suggesting presence of sludge. There is no significant wall thickening in colon. There is no dilation of bile ducts. There is bronchiectasis in lingula. Scattered foci of increased interstitial markings in both lungs suggest scarring or interstitial pneumonia. There is 11 mm nodular density in lingula which may suggest pneumonia or underlying neoplastic process. Follow-up CT in 3 months may be considered. Coronary artery calcifications are seen. There is 50% decrease in height of body of L1 vertebra which may suggest recent or old compression fracture.  Electronically Signed   By: Ernie Avena M.D.   On: 04/09/2022 14:59   DG Chest Port 1 View  Result Date: 04/09/2022 CLINICAL DATA:  Questionable sepsis-evaluate for abnormality. Diarrhea, sepsis, hypotension. EXAM: PORTABLE CHEST 1 VIEW COMPARISON:  Chest CT 09/25/2017.  Chest CT  09/12/2017. FINDINGS: A portion of the left lateral costophrenic angle is excluded from the field of view. Heart size within normal limits. Aortic atherosclerosis. 3 nodular opacities (measuring up to 5 mm) project in the region of the right upper lobe. Elsewhere, there is no appreciable airspace consolidation. No evidence of pleural effusion or pneumothorax. No acute bony abnormality identified. IMPRESSION: A portion of the left lateral costophrenic angle is excluded from the field of view. Three apparent right upper lobe pulmonary nodules. These may be infectious/inflammatory in etiology given the findings on the prior chest CT of 09/25/2017. However, pulmonary neoplasm cannot be excluded a chest CT is recommended for further characterization. Aortic Atherosclerosis (ICD10-I70.0). Electronically Signed   By: Jackey Loge D.O.   On: 04/09/2022 12:34    Procedures Procedures    Medications Ordered in ED Medications  potassium chloride 10 mEq in 100 mL IVPB (10 mEq Intravenous New Bag/Given 04/09/22 1529)  lactated ringers infusion ( Intravenous New Bag/Given 04/09/22 1527)  vancomycin (VANCOCIN) IVPB 1000 mg/200 mL premix (1,000 mg Intravenous New Bag/Given 04/09/22 1559)  heparin ADULT infusion 100 units/mL (25000 units/271mL) (600 Units/hr Intravenous New Bag/Given 04/09/22 1445)  lactated ringers bolus 1,000 mL (0 mLs Intravenous Stopped 04/09/22 1300)  lactated ringers bolus 1,000 mL (0 mLs Intravenous Stopped 04/09/22 1520)  ceFEPIme (MAXIPIME) 2 g in sodium chloride 0.9 % 100 mL IVPB (0 g Intravenous Stopped 04/09/22 1451)  metroNIDAZOLE (FLAGYL) IVPB 500 mg (0 mg Intravenous Stopped 04/09/22 1559)  heparin bolus  via infusion 3,000 Units (3,000 Units Intravenous Bolus from Bag 04/09/22 1445)    ED Course/ Medical Decision Making/ A&P                           Medical Decision Making Amount and/or Complexity of Data Reviewed Labs: ordered. Radiology: ordered. ECG/medicine tests: ordered.  Risk Prescription drug management. Decision regarding hospitalization.   This patient presents to the ED for concern of fatigue and diarrhea, this involves an extensive number of treatment options, and is a complaint that carries with it a high risk of complications and morbidity.  The differential diagnosis includes enteritis, colitis, C. difficile, dehydration, metabolic derangements   Co morbidities that complicate the patient evaluation  HLD, HTN, GERD, osteoporosis, AAA   Additional history obtained:  Additional history obtained from patient's nephew External records from outside source obtained and reviewed including EMR   Lab Tests:  I Ordered, and personally interpreted labs.  The pertinent results include: Multiple concerning findings: Significant leukocytosis of 33.7 with lactic acidosis consistent with sepsis.  This also raises further concern for C. difficile.  Patient has a severe AKI and severe hypokalemia.  Troponin is elevated greater than 18,000.   Imaging Studies ordered:  I ordered imaging studies including chest x-ray, CT of abdomen and pelvis I independently visualized and interpreted imaging which showed chest x-ray that showed right upper lobe pulmonary nodules.  There was no clear evidence of pneumonia.  On CT of abdomen and pelvis, there is a moderate to large stool burden in rectum with perirectal stranding; there is mild diffuse wall thickening of distal small bowel and RLQ; there is a nonspecific 2.4 cm fluid density structure in right side of pelvis there is gallbladder sludge there are interstitial markings in bilateral lungs and an 11 mm nodular density in the lingula. I  agree with the radiologist interpretation   Cardiac Monitoring: / EKG:  The patient was maintained on a cardiac monitor.  I personally viewed and interpreted the cardiac monitored which showed an underlying rhythm of: Sinus rhythm   Consultations Obtained:  I requested consultation with the cardiologist, Dr. Domenic Polite,  and discussed lab and imaging findings as well as pertinent plan - they recommend: Conservative management: Heparinization, echocardiogram, cardiology will follow while inpatient.  Given her AKI, they would not attempt cardiac cath.  She is appropriate for admission to [facility] long.   Problem List / ED Course / Critical interventions / Medication management  Patient is a pleasant 88 year old female presenting from home for ongoing fatigue over the past 2 weeks and diarrhea for the past 2 days.  She describes her diarrhea as watery and nonbloody.  On arrival in the ED, vital signs are notable for soft blood pressure.  She is alert and oriented.  She currently denies any areas of discomfort.  Her breathing is unlabored and her abdomen is without tenderness.  Given her age and nonspecific symptoms, broad diagnostic work-up was initiated.  Results of work-up showed several concerning findings: Patient has a large leukocytosis and a lactic acidosis.  Although current source is unknown and there is concern of C. difficile, patient was started on broad-spectrum antibiotics and given 30 cc/kg of IV fluid.  Creatinine is markedly elevated with elevated BUN consistent with prerenal etiology.  Patient was also found to have severe hypokalemia.  Replacement IV potassium was initiated.  There were no ST segment changes on EKG.  There were some nonspecific T wave abnormalities.  Her troponin was surprisingly elevated greater than 18,000.  Patient denies any recent episodes of chest pain.  I spoke with cardiologist on-call, Dr. Domenic Polite about this.  Given her AKI, patient is not a candidate for cardiac  cath at this time.  Given her absence of chest discomfort, he does recommend conservative management with echocardiogram, heparinization, and supportive care.  Cardiology will continue to follow the patient while admitted.  Patient and her nephew were informed of ominous laboratory results.  Patient states that she would not want CPR if her heart were to stop.  She states that she would be okay with intubation if it was a temporary measure.  Patient to be DNR limited.  She remained alert and oriented with low-normal blood pressures.  She was admitted to hospitalist for further management. I ordered medication including IV fluids and broad-spectrum antibiotics for sepsis; heparin for NSTEMI Reevaluation of the patient after these medicines showed that the patient improved I have reviewed the patients home medicines and have made adjustments as needed   Social Determinants of Health:  Lives independently   CRITICAL CARE Performed by: Godfrey Pick   Total critical care time: 40 minutes  Critical care time was exclusive of separately billable procedures and treating other patients.  Critical care was necessary to treat or prevent imminent or life-threatening deterioration.  Critical care was time spent personally by me on the following activities: development of treatment plan with patient and/or surrogate as well as nursing, discussions with consultants, evaluation of patient's response to treatment, examination of patient, obtaining history from patient or surrogate, ordering and performing treatments and interventions, ordering and review of laboratory studies, ordering and review of radiographic studies, pulse oximetry and re-evaluation of patient's condition.        Final Clinical Impression(s) / ED Diagnoses Final diagnoses:  NSTEMI (non-ST elevated myocardial infarction) ([place])  Acute renal failure, unspecified acute renal failure type (HCC)  Hypokalemia  Sepsis, due to unspecified  organism, unspecified whether acute organ  dysfunction present [facility])    Rx / DC Orders ED Discharge Orders     None         Gloris Manchester, MD 04/09/22 1625

## 2022-04-10 ENCOUNTER — Inpatient Hospital Stay (HOSPITAL_COMMUNITY): Payer: PPO

## 2022-04-10 DIAGNOSIS — I7121 Aneurysm of the ascending aorta, without rupture: Secondary | ICD-10-CM | POA: Diagnosis not present

## 2022-04-10 DIAGNOSIS — I2489 Other forms of acute ischemic heart disease: Secondary | ICD-10-CM | POA: Diagnosis not present

## 2022-04-10 DIAGNOSIS — R34 Anuria and oliguria: Secondary | ICD-10-CM

## 2022-04-10 DIAGNOSIS — R012 Other cardiac sounds: Secondary | ICD-10-CM | POA: Diagnosis not present

## 2022-04-10 DIAGNOSIS — N179 Acute kidney failure, unspecified: Secondary | ICD-10-CM | POA: Diagnosis not present

## 2022-04-10 DIAGNOSIS — M6282 Rhabdomyolysis: Secondary | ICD-10-CM

## 2022-04-10 DIAGNOSIS — K5900 Constipation, unspecified: Secondary | ICD-10-CM

## 2022-04-10 DIAGNOSIS — R7989 Other specified abnormal findings of blood chemistry: Secondary | ICD-10-CM | POA: Diagnosis not present

## 2022-04-10 DIAGNOSIS — A09 Infectious gastroenteritis and colitis, unspecified: Secondary | ICD-10-CM

## 2022-04-10 DIAGNOSIS — D72825 Bandemia: Secondary | ICD-10-CM | POA: Diagnosis not present

## 2022-04-10 LAB — ECHOCARDIOGRAM COMPLETE
AR max vel: 1.2 cm2
AV Area VTI: 1.33 cm2
AV Area mean vel: 1.14 cm2
AV Mean grad: 14.5 mmHg
AV Peak grad: 28.7 mmHg
Ao pk vel: 2.68 m/s
Area-P 1/2: 3.2 cm2
Height: 63 in
S' Lateral: 1.4 cm
Weight: 2095.25 oz

## 2022-04-10 LAB — COMPREHENSIVE METABOLIC PANEL
ALT: 41 U/L (ref 0–44)
ALT: 41 U/L (ref 0–44)
AST: 128 U/L — ABNORMAL HIGH (ref 15–41)
AST: 137 U/L — ABNORMAL HIGH (ref 15–41)
Albumin: 3.6 g/dL (ref 3.5–5.0)
Albumin: 2.4 g/dL — ABNORMAL LOW (ref 3.5–5.0)
Alkaline Phosphatase: 113 U/L (ref 38–126)
Alkaline Phosphatase: 86 U/L (ref 38–126)
Anion gap: 18 — ABNORMAL HIGH (ref 5–15)
Anion gap: 11 (ref 5–15)
BUN: 109 mg/dL — ABNORMAL HIGH (ref 8–23)
BUN: 93 mg/dL — ABNORMAL HIGH (ref 8–23)
CO2: 31 mmol/L (ref 22–32)
CO2: 25 mmol/L (ref 22–32)
Calcium: 9.2 mg/dL (ref 8.9–10.3)
Calcium: 8 mg/dL — ABNORMAL LOW (ref 8.9–10.3)
Chloride: 86 mmol/L — ABNORMAL LOW (ref 98–111)
Chloride: 94 mmol/L — ABNORMAL LOW (ref 98–111)
Creatinine, Ser: 4.46 mg/dL — ABNORMAL HIGH (ref 0.44–1.00)
Creatinine, Ser: 4.06 mg/dL — ABNORMAL HIGH (ref 0.44–1.00)
GFR, Estimated: 9 mL/min — ABNORMAL LOW (ref >60)
GFR, Estimated: 10 mL/min — ABNORMAL LOW (ref >60)
Glucose, Bld: 162 mg/dL — ABNORMAL HIGH (ref 70–99)
Glucose, Bld: 137 mg/dL — ABNORMAL HIGH (ref 70–99)
Potassium: <2 mmol/L — CL (ref 3.5–5.1)
Potassium: 4 mmol/L (ref 3.5–5.1)
Sodium: 135 mmol/L (ref 135–145)
Sodium: 130 mmol/L — ABNORMAL LOW (ref 135–145)
Total Bilirubin: 2.2 mg/dL — ABNORMAL HIGH (ref 0.3–1.2)
Total Bilirubin: 1.9 mg/dL — ABNORMAL HIGH (ref 0.3–1.2)
Total Protein: 6.9 g/dL (ref 6.5–8.1)
Total Protein: 5.1 g/dL — ABNORMAL LOW (ref 6.5–8.1)

## 2022-04-10 LAB — LACTIC ACID, PLASMA
Lactic Acid, Venous: 2.8 mmol/L (ref 0.5–1.9)
Lactic Acid, Venous: 2.4 mmol/L (ref 0.5–1.9)
Lactic Acid, Venous: 1.8 mmol/L (ref 0.5–1.9)

## 2022-04-10 LAB — GASTROINTESTINAL PANEL BY PCR, STOOL (REPLACES STOOL CULTURE)

## 2022-04-10 LAB — MAGNESIUM: Magnesium: 2 mg/dL (ref 1.7–2.4)

## 2022-04-10 LAB — CBC
HCT: 33.4 % — ABNORMAL LOW (ref 36.0–46.0)
Hemoglobin: 12.1 g/dL (ref 12.0–15.0)
MCH: 31.5 pg (ref 26.0–34.0)
MCHC: 36.2 g/dL — ABNORMAL HIGH (ref 30.0–36.0)
MCV: 87 fL (ref 80.0–100.0)
Platelets: 153 10*3/uL (ref 150–400)
RBC: 3.84 MIL/uL — ABNORMAL LOW (ref 3.87–5.11)
RDW: 13 % (ref 11.5–15.5)
WBC: 29.9 10*3/uL — ABNORMAL HIGH (ref 4.0–10.5)
nRBC: 0 % (ref 0.0–0.2)

## 2022-04-10 LAB — PROCALCITONIN: Procalcitonin: 1.74 ng/mL

## 2022-04-10 LAB — CK: Total CK: 612 U/L — ABNORMAL HIGH (ref 38–234)

## 2022-04-10 LAB — PHOSPHORUS: Phosphorus: 1.3 mg/dL — ABNORMAL LOW (ref 2.5–4.6)

## 2022-04-10 LAB — CORTISOL-AM, BLOOD: Cortisol - AM: 40.4 ug/dL — ABNORMAL HIGH (ref 6.7–22.6)

## 2022-04-10 LAB — HEPARIN LEVEL (UNFRACTIONATED): Heparin Unfractionated: 0.53 IU/mL (ref 0.30–0.70)

## 2022-04-10 MED ORDER — LACTATED RINGERS IV SOLN: INTRAVENOUS | Status: AC

## 2022-04-10 MED ORDER — HEPARIN SODIUM (PORCINE) 5000 UNIT/ML IJ SOLN
5000.0000 [IU] | Freq: Three times a day (TID) | INTRAMUSCULAR | Status: DC
  Administered 2022-04-10 – 2022-04-17 (×21): 5000 [IU] via SUBCUTANEOUS
  Filled 2022-04-10 (×21): qty 1

## 2022-04-10 NOTE — Progress Notes (Signed)
ANTICOAGULATION CONSULT NOTE - Follow Up Consult  Pharmacy Consult for heparin Indication: chest pain/ACS  No Known Allergies  Patient Measurements: Height: 5\' 3"  (160 cm) Weight: 59.4 kg (130 lb 15.3 oz) IBW/kg (Calculated) : 52.4 Heparin Dosing Weight: 53 kg  Vital Signs: Temp: 98.1 F (36.7 C) (10/01 0400) Temp Source: Oral (10/01 0400) BP: 80/47 (10/01 0645) Pulse Rate: 87 (10/01 0645)  Labs: Recent Labs    04/09/22 1212 04/09/22 1413 04/09/22 1427 04/09/22 1428 04/09/22 1602 04/09/22 1932 04/09/22 2250 04/10/22 0309  HGB 15.1*  --   --   --   --   --   --  12.1  HCT 41.9  --   --   --   --   --   --  33.4*  PLT 205  --   --   --   --   --   --  153  APTT  --   --  20*  --   --   --   --   --   LABPROT 13.0  --   --   --   --   --   --   --   INR 1.0  --   --   --   --   --   --   --   HEPARINUNFRC  --   --   --  <0.10*  --   --  0.66 0.53  CREATININE 4.46*  --   --   --   --  4.21*  --  4.06*  CKTOTAL  --   --   --   --  5,012*  --   --  612*  TROPONINIHS 18,829* 46,962*  --   --   --   --   --   --     Estimated Creatinine Clearance: 7.2 mL/min (A) (by C-G formula based on SCr of 4.06 mg/dL (H)).  Assessment: Patient is a 88 y.o F who presented to the ED on 04/09/22 with c/o diarrhea and fatigue.  She was subsequently found to have elevated CK, scr and troponin.  She's currently on heparin drip for NSTEMI.  Today, 04/10/2022: - heparin level remains therapeutic at 0.53 - hgb down 12.1, plts 153K - no bleeding documented - scr 4.06 (crcl <10)   Goal of Therapy:  Heparin level 0.3-0.7 units/ml Monitor platelets by anticoagulation protocol: Yes   Plan:  - continue heparin drip at 600 units/hr - daily heparin level and cbc - monitor for s/sx bleeding   Braden Deloach P 04/10/2022,8:32 AM

## 2022-04-10 NOTE — Plan of Care (Signed)
04/10/2022  Problem: Education: Goal: Knowledge of General Education information will improve Description: Including pain rating scale, medication(s)/side effects and non-pharmacologic comfort measures Outcome: Progressing   Problem: Health Behavior/Discharge Planning: Goal: Ability to manage health-related needs will improve Outcome: Progressing   Problem: Clinical Measurements: Goal: Ability to maintain clinical measurements within normal limits will improve Outcome: Progressing Goal: Will remain free from infection Outcome: Progressing Goal: Diagnostic test results will improve Outcome: Progressing Goal: Respiratory complications will improve Outcome: Progressing Goal: Cardiovascular complication will be avoided Outcome: Progressing   Problem: Activity: Goal: Risk for activity intolerance will decrease Outcome: Progressing   Problem: Nutrition: Goal: Adequate nutrition will be maintained Outcome: Progressing   Problem: Coping: Goal: Level of anxiety will decrease Outcome: Progressing   Problem: Elimination: Goal: Will not experience complications related to bowel motility Outcome: Progressing Goal: Will not experience complications related to urinary retention Outcome: Progressing   Problem: Pain Managment: Goal: General experience of comfort will improve Outcome: Progressing   Problem: Safety: Goal: Ability to remain free from injury will improve Outcome: Progressing   Problem: Skin Integrity: Goal: Risk for impaired skin integrity will decrease Outcome: Progressing   Cindy S. Brigitte Pulse BSN, RN, [place] 04/10/2022 5:48 AM

## 2022-04-10 NOTE — Consult Note (Addendum)
CONSULTATION NOTE   Patient Name: Gloria [name] Date of Encounter: 04/10/2022 Cardiologist: None Electrophysiologist: None Advanced Heart Failure: None   Chief Complaint   Weakness  Patient Profile   88 yo female presented with weakness and diarrhea, found to have significant leukocytosis, renal failure, lactic acidosis and elevated troponin to 16K with high CK of 5012, now improved to 612.  HPI   Gloria [name] is a 88 y.o. female who is being seen today for the evaluation of elevated troponin at the request of Dr. Cyndia Skeeters. This is a 88 year old female with no cardiac history, who presented with progressive weakness and diarrhea.  She reported this has been worsening over 4 to 5 days.  Due to weakness she had not taken her blood pressure medication.  On arrival systolic blood pressure was in the 70s.  She is noted to live alone and was in her usual state of health until this presentation.  Initial work-up concerning for C. difficile colitis however this appears to be negative.  Stool pathogens are pending.  She had notable leukocytosis with a white count of 33,000, elevated lactate, acute renal failure with creatinine 4.46, bicarb 31 and marked troponinemia initially at 18,800 then down to 16,600.  This was in the setting of some degree of rhabdomyolysis with elevated CK of 5012, improving with IV fluids.  Cardiology was asked to consult given elevated troponin.  She is frankly denied any chest pain or cardiac symptoms with this.  PMHx   Past Medical History:  Diagnosis Date   GERD 04/10/2007   Qualifier: Diagnosis of  By: Elveria Royals    HYPERLIPIDEMIA 04/13/2007   Qualifier: Diagnosis of  By: Jenny Reichmann MD, Hunt Oris    HYPERTENSION 04/10/2007   Qualifier: Diagnosis of  By: Elveria Royals    Impaired glucose tolerance 09/17/2011   OSTEOPOROSIS 04/10/2007   Qualifier: Diagnosis of  By: Elveria Royals    Unspecified vitamin D deficiency 08/26/2009    Centricity Description: UNSPECIFIED VITAMIN D DEFICIENCY Qualifier: Diagnosis of  By: Jenny Reichmann MD, Hunt Oris  Centricity Description: VITAMIN D DEFICIENCY Qualifier: Diagnosis of  By: Jenny Reichmann MD, Hunt Oris     Past Surgical History:  Procedure Laterality Date   CATARACT EXTRACTION      FAMHx   Family History  Problem Relation Age of Onset   Heart disease Father    Stroke Sister     SOCHx    reports that she has never smoked. She has never used smokeless tobacco. She reports that she does not drink alcohol and does not use drugs.  Outpatient Medications   No current facility-administered medications on file prior to encounter.   Current Outpatient Medications on File Prior to Encounter  Medication Sig Dispense Refill   Cholecalciferol (THERA-D 2000) 50 MCG (2000 UT) TABS 1 tab by mouth once daily 30 tablet 99   lisinopril-hydrochlorothiazide (ZESTORETIC) 20-12.5 MG tablet Take 1 tablet by mouth daily. 90 tablet 3   rosuvastatin (CRESTOR) 40 MG tablet TAKE ONE TABLET BY MOUTH DAILY (Patient taking differently: Take 40 mg by mouth daily.) 90 tablet 3    Inpatient Medications    Scheduled Meds:  Chlorhexidine Gluconate Cloth  6 each Topical Daily   midodrine  5 mg Oral TID WC   vancomycin variable dose per unstable renal function (pharmacist dosing)   Does not apply See admin instructions    Continuous Infusions:  sodium chloride 10 mL/hr at 04/10/22 0600   ceFEPime (MAXIPIME) IV  heparin 600 Units/hr (04/10/22 0600)   lactated ringers 150 mL/hr at 04/10/22 0618   metronidazole Stopped (04/10/22 0300)   norepinephrine (LEVOPHED) Adult infusion 4 mcg/min (04/10/22 0600)    PRN Meds: acetaminophen **OR** acetaminophen, ondansetron **OR** ondansetron (ZOFRAN) IV, mouth rinse   ALLERGIES   No Known Allergies  ROS   Pertinent items noted in HPI and remainder of comprehensive ROS otherwise negative.  Vitals   Vitals:   04/10/22 0600 04/10/22 0615 04/10/22 0630 04/10/22  0645  BP: 118/64 116/62 (!) 111/96 (!) 80/47  Pulse: 84 87 88 87  Resp: (!) 23 (!) 21 18 (!) 22  Temp:      TempSrc:      SpO2: 98% 97% 97% 96%  Weight:      Height:        Intake/Output Summary (Last 24 hours) at 04/10/2022 0802 Last data filed at 04/10/2022 0600 Gross per 24 hour  Intake 3498.56 ml  Output 1 ml  Net 3497.56 ml   Filed Weights   04/09/22 1129 04/10/22 0500  Weight: 53.1 kg 59.4 kg    Physical Exam   General appearance: alert and no distress Neck: no carotid bruit, no JVD, and thyroid not enlarged, symmetric, no tenderness/mass/nodules Lungs: clear to auscultation bilaterally Heart: regular rate and rhythm, S1, S2 normal, and systolic murmur: systolic ejection 3/6, crescendo at 2nd right intercostal space Abdomen: soft, non-tender; bowel sounds normal; no masses,  no organomegaly Extremities: extremities normal, atraumatic, no cyanosis or edema Pulses: 2+ and symmetric Skin: Pale, warm, dry Neurologic: Grossly normal Psych: Pleasant  Labs   Results for orders placed or performed during the hospital encounter of 04/09/22 (from the past 48 hour(s))  Urinalysis, Routine w reflex microscopic Urine, In & Out Cath     Status: Abnormal   Collection Time: 04/09/22 11:40 AM  Result Value Ref Range   Color, Urine AMBER (A) YELLOW    Comment: BIOCHEMICALS MAY BE AFFECTED BY COLOR   APPearance CLOUDY (A) CLEAR   Specific Gravity, Urine 1.016 1.005 - 1.030   pH 5.0 5.0 - 8.0   Glucose, UA 50 (A) NEGATIVE mg/dL   Hgb urine dipstick MODERATE (A) NEGATIVE   Bilirubin Urine NEGATIVE NEGATIVE   Ketones, ur NEGATIVE NEGATIVE mg/dL   Protein, ur 161 (A) NEGATIVE mg/dL   Nitrite NEGATIVE NEGATIVE   Leukocytes,Ua SMALL (A) NEGATIVE   RBC / HPF 6-10 0 - 5 RBC/hpf   WBC, UA 21-50 0 - 5 WBC/hpf   Bacteria, UA NONE SEEN NONE SEEN   Squamous Epithelial / LPF 0-5 0 - 5   WBC Clumps PRESENT    Mucus PRESENT    Hyaline Casts, UA PRESENT     Comment: Performed at [facility], [address], [place], [place] [address]  Lactic acid, plasma     Status: Abnormal   Collection Time: 04/09/22 11:55 AM  Result Value Ref Range   Lactic Acid, Venous 2.5 (HH) 0.5 - 1.9 mmol/L    Comment: CRITICAL RESULT CALLED TO, READ BACK BY AND VERIFIED WITH: MONTEE,W  ON 04/09/22 BY LUZOLOP Performed at [facility], [address] [place], [place] [address] CORRECTED ON 09/30 AT 1349: PREVIOUSLY REPORTED AS 2.5 CRITICAL RESULT CALLED TO, READ BACK BY AND VERIFIED WITH MONTEE,W  ON 04/09/22 BY LUZOLOP   Comprehensive metabolic panel     Status: Abnormal   Collection Time: 04/09/22 12:12 PM  Result Value Ref Range   Sodium 135 135 -  145 mmol/L   Potassium >2.0 (LL) 3.5 - 5.1 mmol/L    Comment: CRITICAL RESULT CALLED TO, READ BACK BY AND VERIFIED WITH: MONTEE,W @1330  ON 04/09/22 BY LUZOLOP    Chloride 86 (L) 98 - 111 mmol/L   CO2 31 22 - 32 mmol/L   Glucose, Bld 162 (H) 70 - 99 mg/dL    Comment: Glucose reference range applies only to samples taken after fasting for at least 8 hours.   BUN 109 (H) 8 - 23 mg/dL    Comment: RESULTS CONFIRMED BY MANUAL DILUTION   Creatinine, Ser 4.46 (H) 0.44 - 1.00 mg/dL   Calcium 9.2 8.9 - 04/11/22 mg/dL   Total Protein 6.9 6.5 - 8.1 g/dL   Albumin 3.6 3.5 - 5.0 g/dL   AST 73.4 (H) 15 - 41 U/L   ALT 41 0 - 44 U/L   Alkaline Phosphatase 113 38 - 126 U/L   Total Bilirubin 2.2 (H) 0.3 - 1.2 mg/dL   GFR, Estimated 9 (L) >60 mL/min    Comment: (NOTE) Calculated using the CKD-EPI Creatinine Equation (2021)    Anion gap 18 (H) 5 - 15    Comment: Performed at [facility], [address], [place], [place]  CBC with Differential     Status: Abnormal   Collection Time: 04/09/22 12:12 PM  Result Value Ref Range   WBC 33.7 (H) 4.0 - 10.5 K/uL   RBC 4.85 3.87 - 5.11 MIL/uL   Hemoglobin 15.1 (H) 12.0 - 15.0 g/dL   HCT 04/11/22 72.6 - 20.3 %   MCV 86.4 80.0 - 100.0 fL   MCH  31.1 26.0 - 34.0 pg   MCHC 36.0 30.0 - 36.0 g/dL   RDW 55.9 74.1 - 63.8 %   Platelets 205 150 - 400 K/uL   nRBC 0.0 0.0 - 0.2 %   Neutrophils Relative % 87 %   Neutro Abs 29.0 (H) 1.7 - 7.7 K/uL   Lymphocytes Relative 8 %   Lymphs Abs 2.8 0.7 - 4.0 K/uL   Monocytes Relative 4 %   Monocytes Absolute 1.5 (H) 0.1 - 1.0 K/uL   Eosinophils Relative 0 %   Eosinophils Absolute 0.1 0.0 - 0.5 K/uL   Basophils Relative 0 %   Basophils Absolute 0.1 0.0 - 0.1 K/uL   Immature Granulocytes 1 %   Abs Immature Granulocytes 0.30 (H) 0.00 - 0.07 K/uL    Comment: Performed at [facility], [address], [place], [place]  Protime-INR     Status: None   Collection Time: 04/09/22 12:12 PM  Result Value Ref Range   Prothrombin Time 13.0 11.4 - 15.2 seconds   INR 1.0 0.8 - 1.2    Comment: (NOTE) INR goal varies based on device and disease states. Performed at [facility], [address], [place], [place]   Magnesium     Status: Abnormal   Collection Time: 04/09/22 12:12 PM  Result Value Ref Range   Magnesium 2.9 (H) 1.7 - 2.4 mg/dL    Comment: Performed at [facility], [address] [place], [place]  Troponin I (High Sensitivity)     Status: Abnormal   Collection Time: 04/09/22 12:12 PM  Result Value Ref Range   Troponin I (High Sensitivity) 18,829 (HH) <18 ng/L    Comment: CRITICAL RESULT CALLED TO, READ BACK BY AND VERIFIED WITH: MONTEE,W @1330  ON 04/09/22 BY LUZOLOP (NOTE) Elevated high sensitivity troponin I (  hsTnI) values and significant  changes across serial measurements may suggest ACS but many other  chronic and acute conditions are known to elevate hsTnI results.  Refer to the Links section for chest pain algorithms and additional  guidance. Performed at [facility], [address] [place], [place] [address]   Lactic acid, plasma     Status: Abnormal   Collection Time:  04/09/22  2:00 PM  Result Value Ref Range   Lactic Acid, Venous 3.7 (HH) 0.5 - 1.9 mmol/L    Comment: CRITICAL VALUE NOTED. VALUE IS CONSISTENT WITH PREVIOUSLY REPORTED/CALLED VALUE Performed at [facility], [address], [place], [place] [address]   Troponin I (High Sensitivity)     Status: Abnormal   Collection Time: 04/09/22  2:13 PM  Result Value Ref Range   Troponin I (High Sensitivity) 16,633 (HH) <18 ng/L    Comment: CRITICAL VALUE NOTED. VALUE IS CONSISTENT WITH PREVIOUSLY REPORTED/CALLED VALUE (NOTE) Elevated high sensitivity troponin I (hsTnI) values and significant  changes across serial measurements may suggest ACS but many other  chronic and acute conditions are known to elevate hsTnI results.  Refer to the "Links" section for chest pain algorithms and additional  guidance. Performed at [facility], [address] [place], [place] [address]   APTT     Status: Abnormal   Collection Time: 04/09/22  2:27 PM  Result Value Ref Range   aPTT 20 (L) 24 - 36 seconds    Comment: Performed at [facility], [address], [place], [place] [address]  Heparin level (unfractionated)     Status: Abnormal   Collection Time: 04/09/22  2:28 PM  Result Value Ref Range   Heparin Unfractionated <0.10 (L) 0.30 - 0.70 IU/mL    Comment: (NOTE) The clinical reportable range upper limit is being lowered to >1.10 to align with the FDA approved guidance for the current laboratory assay.  If heparin results are below expected values, and patient dosage has  been confirmed, suggest follow up testing of antithrombin III levels. Performed at [facility], [address], [place], [place] [address]   CK     Status: Abnormal   Collection Time: 04/09/22  4:02 PM  Result Value Ref Range   Total CK 5,012 (H) 38 - 234 U/L    Comment: RESULT CONFIRMED BY MANUAL DILUTION Performed at [facility], [address], [place], [place] [address]   C Difficile Quick Screen w PCR reflex     Status: None   Collection Time: 04/09/22  6:07 PM   Specimen: STOOL  Result Value Ref Range   C Diff antigen NEGATIVE NEGATIVE   C Diff toxin NEGATIVE NEGATIVE   C Diff interpretation No C. difficile detected.     Comment: Performed at [facility], [address] [place], [place] [address]  MRSA Next Gen by PCR, Nasal     Status: None   Collection Time: 04/09/22  6:28 PM   Specimen: Nasal Mucosa; Nasal Swab  Result Value Ref Range   MRSA by PCR Next Gen NOT DETECTED NOT DETECTED    Comment: (NOTE) The GeneXpert MRSA Assay (FDA approved for NASAL specimens only), is one component of a comprehensive MRSA colonization surveillance program. It is not intended to diagnose MRSA infection nor to guide or monitor treatment for MRSA infections. Test performance is not FDA approved in patients less than 31 years old. Performed at [facility], [address] [place], [place] [address]   Lactic acid, plasma     Status: Abnormal   Collection Time: 04/09/22  7:32 PM  Result Value Ref Range   Lactic Acid, Venous 3.5 (HH) 0.5 - 1.9 mmol/L    Comment: CRITICAL VALUE NOTED. VALUE IS CONSISTENT WITH PREVIOUSLY REPORTED/CALLED VALUE Performed at [facility], [address], [place], [place] [address]   Renal function panel     Status: Abnormal   Collection Time: 04/09/22  7:32 PM  Result Value Ref Range   Sodium 133 (L) 135 - 145 mmol/L   Potassium 2.7 (LL) 3.5 - 5.1 mmol/L    Comment: CRITICAL RESULT CALLED TO, READ BACK BY AND VERIFIED WITH SHAW,C. 04/09/22  BY SEEL,M.    Chloride 91 (L) 98 - 111 mmol/L   CO2 26 22 - 32 mmol/L   Glucose, Bld 132 (H) 70 - 99 mg/dL    Comment: Glucose reference range applies only to samples taken after fasting for at least 8 hours.   BUN 100 (H) 8 - 23 mg/dL   Creatinine, Ser 0.98 (H) 0.44 - 1.00 mg/dL    Calcium 8.5 (L) 8.9 - 10.3 mg/dL   Phosphorus 2.4 (L) 2.5 - 4.6 mg/dL   Albumin 2.9 (L) 3.5 - 5.0 g/dL   GFR, Estimated 9 (L) >60 mL/min    Comment: (NOTE) Calculated using the CKD-EPI Creatinine Equation (2021)    Anion gap 16 (H) 5 - 15    Comment: Performed at [facility], [address] [place], [place] [address]  Heparin level (unfractionated)     Status: None   Collection Time: 04/09/22 10:50 PM  Result Value Ref Range   Heparin Unfractionated 0.66 0.30 - 0.70 IU/mL    Comment: (NOTE) The clinical reportable range upper limit is being lowered to >1.10 to align with the FDA approved guidance for the current laboratory assay.  If heparin results are below expected values, and patient dosage has  been confirmed, suggest follow up testing of antithrombin III levels. Performed at [facility], [address] [place], [place] [address]   Lactic acid, plasma     Status: Abnormal   Collection Time: 04/09/22 10:50 PM  Result Value Ref Range   Lactic Acid, Venous 2.8 (HH) 0.5 - 1.9 mmol/L    Comment: CRITICAL VALUE NOTED. VALUE IS CONSISTENT WITH PREVIOUSLY REPORTED/CALLED VALUE Performed at [facility], [address], [place], [place] [address]   CBC     Status: Abnormal   Collection Time: 04/10/22  3:09 AM  Result Value Ref Range   WBC 29.9 (H) 4.0 - 10.5 K/uL   RBC 3.84 (L) 3.87 - 5.11 MIL/uL   Hemoglobin 12.1 12.0 - 15.0 g/dL   HCT 86.5 (L) 78.4 - 69.6 %   MCV 87.0 80.0 - 100.0 fL   MCH 31.5 26.0 - 34.0 pg   MCHC 36.2 (H) 30.0 - 36.0 g/dL   RDW 29.5 28.4 - 13.2 %   Platelets 153 150 - 400 K/uL   nRBC 0.0 0.0 - 0.2 %    Comment: Performed at [facility], [address], [place], [place] [address]  Magnesium     Status: None   Collection Time: 04/10/22  3:09 AM  Result Value Ref Range   Magnesium 2.0 1.7 - 2.4 mg/dL    Comment: Performed at [facility], [address] [place], [place] [address]  Phosphorus     Status: Abnormal   Collection Time: 04/10/22  3:09 AM  Result Value Ref Range   Phosphorus 1.3 (L) 2.5 - 4.6 mg/dL    Comment: Performed at [facility], [address] [place], [place] [address]  Comprehensive metabolic panel     Status: Abnormal   Collection Time: 04/10/22  3:09 AM  Result Value Ref Range   Sodium 130 (L) 135 - 145 mmol/L   Potassium 4.0 3.5 - 5.1 mmol/L   Chloride 94 (L) 98 - 111 mmol/L   CO2 25 22 - 32 mmol/L   Glucose, Bld 137 (H) 70 - 99 mg/dL    Comment: Glucose reference range applies only to samples taken after fasting for at least 8 hours.   BUN 93 (H) 8 - 23 mg/dL   Creatinine, Ser 3.66 (H) 0.44 - 1.00 mg/dL   Calcium 8.0 (L) 8.9 - 10.3 mg/dL   Total Protein 5.1 (L) 6.5 - 8.1 g/dL   Albumin 2.4 (L) 3.5 - 5.0 g/dL   AST 440 (H) 15 - 41 U/L   ALT 41 0 - 44 U/L   Alkaline Phosphatase 86 38 - 126 U/L   Total Bilirubin 1.9 (H) 0.3 - 1.2 mg/dL   GFR, Estimated 10 (L) >60 mL/min    Comment: (NOTE) Calculated using the CKD-EPI Creatinine Equation (2021)    Anion gap 11 5 - 15    Comment: Performed at [facility], [address] [place], [place] [address]  Cortisol-am, blood     Status: Abnormal   Collection Time: 04/10/22  3:09 AM  Result Value Ref Range   Cortisol - AM 40.4 (H) 6.7 - 22.6 ug/dL    Comment: Performed at [facility], [address] [place], [place] [address]  CK     Status: Abnormal   Collection Time: 04/10/22  3:09 AM  Result Value Ref Range   Total CK 612 (H) 38 - 234 U/L    Comment: RESULT CONFIRMED BY MANUAL DILUTION Performed at [facility], [address] [place], [place] [address]   Procalcitonin - Baseline     Status: None   Collection Time: 04/10/22  3:09 AM  Result Value Ref Range   Procalcitonin 1.74 ng/mL    Comment:        Interpretation: PCT > 0.5 ng/mL and <= 2 ng/mL: Systemic infection (sepsis) is  possible, but other conditions are known to elevate PCT as well. (NOTE)       Sepsis PCT Algorithm           Lower Respiratory Tract                                      Infection PCT Algorithm    ----------------------------     ----------------------------         PCT < 0.25 ng/mL                PCT < 0.10 ng/mL          Strongly encourage             Strongly discourage   discontinuation of antibiotics    initiation of antibiotics    ----------------------------     -----------------------------       PCT 0.25 - 0.50 ng/mL            PCT  0.10 - 0.25 ng/mL               OR       >80% decrease in PCT            Discourage initiation of                                            antibiotics      Encourage discontinuation           of antibiotics    ----------------------------     -----------------------------         PCT >= 0.50 ng/mL              PCT 0.26 - 0.50 ng/mL                AND       <80% decrease in PCT             Encourage initiation of                                             antibiotics       Encourage continuation           of antibiotics    ----------------------------     -----------------------------        PCT >= 0.50 ng/mL                  PCT > 0.50 ng/mL               AND         increase in PCT                  Strongly encourage                                      initiation of antibiotics    Strongly encourage escalation           of antibiotics                                     -----------------------------                                           PCT <= 0.25 ng/mL                                                 OR                                        > 80% decrease in PCT  Discontinue / Do not initiate                                             antibiotics  Performed at [facility], [address], [place], [place] [address]   Heparin level (unfractionated)     Status: None    Collection Time: 04/10/22  3:09 AM  Result Value Ref Range   Heparin Unfractionated 0.53 0.30 - 0.70 IU/mL    Comment: (NOTE) The clinical reportable range upper limit is being lowered to >1.10 to align with the FDA approved guidance for the current laboratory assay.  If heparin results are below expected values, and patient dosage has  been confirmed, suggest follow up testing of antithrombin III levels. Performed at [facility], [address], [place], [place] [address]     ECG   Sinus rhythm with PACs- Personally Reviewed  Telemetry   Sinus rhythm- Personally Reviewed  Radiology   CT ABDOMEN PELVIS WO CONTRAST  Result Date: 04/09/2022 CLINICAL DATA:  Abdominal pain, diarrhea EXAM: CT ABDOMEN AND PELVIS WITHOUT CONTRAST TECHNIQUE: Multidetector CT imaging of the abdomen and pelvis was performed following the standard protocol without IV contrast. RADIATION DOSE REDUCTION: This exam was performed according to the departmental dose-optimization program which includes automated exposure control, adjustment of the mA and/or kV according to patient size and/or use of iterative reconstruction technique. COMPARISON:  None Available. FINDINGS: Lower chest: There is ectasia of bronchi in lingula. There are small scattered foci of increased interstitial markings in both lower lung fields. There is 11 mm alveolar density in lingula. There are other smaller scattered alveolar densities in both lower lung fields. Coronary artery calcifications are seen. Hepatobiliary: No focal abnormalities are seen in liver. There is no dilation of bile ducts. Gallbladder is distended. There is no wall thickening in gallbladder. Possible sludge is seen in the lumen of gallbladder. Pancreas: No focal abnormalities are seen. Spleen: Unremarkable. Adrenals/Urinary Tract: Adrenals are not enlarged. There is no hydronephrosis. There are no renal or ureteral stones. Urinary bladder is empty.  Stomach/Bowel: Stomach is unremarkable. Small bowel loops are not dilated. There is mild diffuse wall thickening in distal small bowel loops. Appendix is not dilated. Moderate to large amount of stool is seen in rectum. There is mild diffuse wall thickening in rectum. There is stranding in the fat planes around the rectum. Vascular/Lymphatic: Extensive arterial calcifications are seen. Reproductive: Uterus is not distinctly visualized. In image 61 of series 2, there is 2.4 cm fluid density structure in the right side of pelvis. Other: There is no ascites or pneumoperitoneum. Small umbilical hernia containing fat is seen. Small right inguinal hernia containing fat is seen. Musculoskeletal: There is 50% decrease in height of body of L1 vertebra. IMPRESSION: There is moderate to large amount of stool is seen in the lumen of rectum. There is mild diffuse wall thickening in rectum along with perirectal stranding. Possibility of stercoral colitis is not excluded. There is mild diffuse wall thickening in few distal small bowel loops in right lower quadrant suggesting possible nonspecific inflammatory or infectious enteritis. There is no evidence of intestinal obstruction or pneumoperitoneum. There is no hydronephrosis. Appendix is not dilated. There is 2.4 cm smooth marginated fluid density structure in the right side of pelvis which may be ovarian cyst or an artifact due to partial volume averaging of  fluid in partly contracted bowel loop. Follow-up pelvic sonogram in 3 months may be considered. There is subtle increased density in the dependent portion of gallbladder suggesting presence of sludge. There is no significant wall thickening in colon. There is no dilation of bile ducts. There is bronchiectasis in lingula. Scattered foci of increased interstitial markings in both lungs suggest scarring or interstitial pneumonia. There is 11 mm nodular density in lingula which may suggest pneumonia or underlying neoplastic  process. Follow-up CT in 3 months may be considered. Coronary artery calcifications are seen. There is 50% decrease in height of body of L1 vertebra which may suggest recent or old compression fracture. Electronically Signed   By: Ernie Avena M.D.   On: 04/09/2022 14:59   DG Chest Port 1 View  Result Date: 04/09/2022 CLINICAL DATA:  Questionable sepsis-evaluate for abnormality. Diarrhea, sepsis, hypotension. EXAM: PORTABLE CHEST 1 VIEW COMPARISON:  Chest CT 09/25/2017.  Chest CT 09/12/2017. FINDINGS: A portion of the left lateral costophrenic angle is excluded from the field of view. Heart size within normal limits. Aortic atherosclerosis. 3 nodular opacities (measuring up to 5 mm) project in the region of the right upper lobe. Elsewhere, there is no appreciable airspace consolidation. No evidence of pleural effusion or pneumothorax. No acute bony abnormality identified. IMPRESSION: A portion of the left lateral costophrenic angle is excluded from the field of view. Three apparent right upper lobe pulmonary nodules. These may be infectious/inflammatory in etiology given the findings on the prior chest CT of 09/25/2017. However, pulmonary neoplasm cannot be excluded a chest CT is recommended for further characterization. Aortic Atherosclerosis (ICD10-I70.0). Electronically Signed   By: Jackey Loge D.O.   On: 04/09/2022 12:34    Cardiac Studies   Echo pending  Impression   Principal Problem:   Non-STEMI (non-ST elevated myocardial infarction) [facility]) Active Problems:   Essential hypertension   GERD   Heart murmur   Ascending aortic aneurysm (HCC)   AKI (acute kidney injury) (HCC)   Azotemia   Generalized weakness   Bandemia   Hypokalemia   Elevated AST (SGOT)   Diarrhea   Enteritis   Constipation   Stercoral colitis   Dehydration   Hypotension   Lactic acidosis   Recommendation   Suspect demand ischemia in the setting of severe sepsis.  Etiology still unclear although  infectious colitis is the leading suspicion.  She appeared profoundly dehydrated.  She already received 3 L of IV fluids.  No signs or symptoms of congestive heart failure.  She has remained hypotensive requiring low-dose Levophed.  The etiology of that is unclear.  Echo is pending today - she has a systolic ejection murmur.  She is on IV heparin which likely could be discontinued.  Her CKs were very high suggesting there might be a rhabdomyolysis component as well.  The CK is improving.  Would follow her lactate as well.  Throughout that she is denied any chest pain or shortness of breath.  Thanks for the consultation.  Cardiology will follow with you.  Time Spent Directly with Patient:  I have spent a total of 45 minutes with the patient reviewing hospital notes, telemetry, EKGs, labs and examining the patient as well as establishing an assessment and plan that was discussed personally with the patient.  > 50% of time was spent in direct patient care.  Length of Stay:  LOS: 1 day   Chrystie Nose, MD, [facility], FACP  [place]  [facility]  Medical Director of the  Advanced Lipid Disorders &  Cardiovascular Risk Reduction Clinic Diplomate of the American Board of Clinical Lipidology Attending Cardiologist  Direct Dial: [phone]  Fax: [phone]  Website:  [email or web address]   Charina Fons C Armanii Pressnell 04/10/2022, 8:02 AM

## 2022-04-10 NOTE — Progress Notes (Signed)
PROGRESS NOTE  Gloria [name] ZOX:[id number] DOB: Dec 20, 1926   PCP: [name], MD  Patient is from: Home.  Lives alone.  Very independent and active at baseline.  DOA: 04/09/2022 LOS: 1  Chief complaints Chief Complaint  Patient presents with   Hypotension   Diarrhea     Brief Narrative / Interim history: 88 year old F with PMH of HTN, AAA, heart murmur, GERD and osteoporosis brought to ED by EMS due to weakness and diarrhea for 4 to 5 days.  She was hypotensive to 70s when EMS arrived but improved to 80s after 500 cc bolus.  She had significant leukocytosis with lactic acidosis raising concern for infection.  She is also in severe AKI with azotemia.  CT abdomen and pelvis suggests enteritis, stercolitis, moderate to large stool burden, 2.4 cm fluid density in right pelvis, biliary sludge and lingula bronchiectasis with interstitial marking.  She also have markedly elevated troponin to 19,000 that has improved to 17,000.  She had no cardiopulmonary symptoms or EKG findings to suggest ACS.  CK was elevated to 6000 as well.  UA does not suggest UTI.  She was admitted with working diagnosis of non-STEMI, hypotension, severe dehydration, AKI.  She was started on rigorous IV fluid, broad-spectrum antibiotics and admitted.  C. difficile negative.  MRSA PCR nonreactive.  TTE, GIP and blood culture pending.   Subjective: Seen and examined earlier this afternoon.  Patient was hypotensive to 80s/40s last night but not tachycardic or symptomatic.  She was started on Levophed by PIV.  Remains on IV fluid at 150 cc an hour.  Blood pressure improved but remains soft.  Remains asymptomatic from hypertension standpoint.  She denies chest pain, dyspnea, dizziness, nausea, vomiting, abdominal pain or UTI symptoms.  Had 1 loose bowel movement this morning.  No blood in stool.  She is not making urine.  Objective: Vitals:   04/10/22 1230 04/10/22 1245 04/10/22 1300 04/10/22 1315  BP: (!) 116/55 (!)  117/55 (!) 97/47 (!) 94/48  Pulse:      Resp: Temp:      TempSrc:      SpO2:      Weight:      Height:        Examination:  GENERAL: No apparent distress.  Nontoxic. HEENT: MMM.  Vision and hearing grossly intact.  NECK: Supple.  No apparent JVD.  RESP:  No IWOB.  Fair aeration bilaterally. CVS:  RRR.  2/6 SEM over LUSB and LLSB.  Heart sounds normal.  ABD/GI/GU: BS+. Abd soft, NTND.  MSK/EXT:  Moves extremities. No apparent deformity. No edema.  SKIN: no apparent skin lesion or wound NEURO: Awake, alert and oriented appropriately.  No apparent focal neuro deficit. PSYCH: Calm. Normal affect.   Procedures:  None  Microbiology summarized: MRSA PCR screen negative. C. difficile negative. Blood cultures pending GIP negative.  Assessment and plan: Principal Problem:   Elevated troponin Active Problems:   Essential hypertension   GERD   Heart murmur   Ascending aortic aneurysm (HCC)   AKI (acute kidney injury) (HCC)   Azotemia   Generalized weakness   Bandemia   Hypokalemia   Elevated AST (SGOT)   Diarrhea   Enteritis   Constipation   Stercoral colitis   Dehydration   Hypotension   Lactic acidosis   Rhabdomyolysis   Anuria  Markedly elevated troponin: No cardiopulmonary symptoms or signs of ischemia on EKG. She is also severely dehydrated with severe AKI which might  be contributing.  Also concern for infectious process but no clear source other than some enteritis.  TTE with LVEF of 70 to 75%, G1 DD and moderate AVS. Non-STEMI ruled out -Appreciate input by cardiology -Discontinue IV heparin. -Continue broad-spectrum antibiotics and IV fluid   Anuric AKI/azotemia: Likely prerenal in the setting of GI loss and concurrent use of ACE inhibitors and HCTZ.  No signs of obstruction on CT. CK elevated to 6000 but improved.  No urine output.  Bladder scan less than 90 cc. Recent Labs    11/16/21 0903 04/09/22 1212 04/09/22 1932 04/10/22 0309  BUN 14  109* 100* 93*  CREATININE 0.75 4.46* 4.21* 4.06*  -Hold HCTZ and ACE inhibitor's -Continue IV fluid, reduce rate to 75 cc an hour -Strict intake and output -Bladder scan every 8 hours -Continue broad-spectrum antibiotics for possible infection   Severe dehydration/hypotension: Dehydration improved.  Started on Levophed via PIV for hypotension but not symptomatic.  -Continue low-dose midodrine -Decreased IV fluid to 75 cc an hour -Wean off Levophed   Diarrhea/enteritis/possible stercolitis/constipation: C. difficile negative.  Abdominal exam benign. -May need aggressive bowel regimen once stabilized -Follow GI.  Leukocytosis/bandemia/lactic acidosis: Most likely from dehydration, AKI but cannot rule out infectious cause yet.  Diarrhea and CT findings suggest enteritis but abdominal exam benign.  No respiratory or UTI symptoms.  Some pelvic fluid collection on CT thought to be cystic.  Nontraumatic rhabdomyolysis/elevated AST: Likely due to dehydration.  Improved. -IV fluid as above -Continue monitoring   Abnormal CT-some concern about lung nodules in RUL and 2.4 cm fluid density in pelvis suggesting ovarian cyst or an artifact. -Outpatient follow-up for RUL nodules -Follow-up pelvic sonogram in 3 months for pelvic cyst   Generalized weakness-in the setting of dehydration, diarrhea, AKI, azotemia... Patient is very independent and active at baseline -PT/OT eval   Heart murmur/moderate aortic valve stenosis: TTE as above.   Goal of care discussion: Discussed CODE STATUS including pros and cons of CPR and intubation.  Patient prefers to be DNR.  Nephew, HCPOA in agreement.  Hypokalemia: Resolved  Hyponatremia: Likely from renal failure.  Hypophosphatemia:  -Recheck in the morning.  Would not supplement given renal failure  Body mass index is 23.2 kg/m.   Pressure skin injury: POA Pressure Injury 04/09/22 Buttocks Right Deep Tissue Pressure Injury - Purple or maroon localized  area of discolored intact skin or blood-filled blister due to damage of underlying soft tissue from pressure and/or shear. 6X2.5cm (Active)  04/09/22 1815  Location: Buttocks  Location Orientation: Right  Staging: Deep Tissue Pressure Injury - Purple or maroon localized area of discolored intact skin or blood-filled blister due to damage of underlying soft tissue from pressure and/or shear.  Wound Description (Comments): 6X2.5cm  Present on Admission: Yes  Dressing Type Foam - Lift dressing to assess site every shift 04/10/22 0804   DVT prophylaxis:  heparin injection 5,000 Units Start: 04/10/22 1415  Patient is on IV heparin.  Code Status: DNR/DNI Family Communication: Updated patient's nephew, H POA at bedside Level of care: Stepdown Status is: Inpatient Remains inpatient appropriate because: Severe dehydration, hypotension, AKI   Final disposition: Remains in ICU/stepdown Consultants:  None  Sch Meds:  Scheduled Meds:  Chlorhexidine Gluconate Cloth  6 each Topical Daily   heparin injection (subcutaneous)  5,000 Units Subcutaneous Q8H   midodrine  5 mg Oral TID WC   vancomycin variable dose per unstable renal function (pharmacist dosing)   Does not apply See admin instructions  Continuous Infusions:  sodium chloride 10 mL/hr at 04/10/22 1216   ceFEPime (MAXIPIME) IV     lactated ringers 75 mL/hr at 04/10/22 1216   metronidazole Stopped (04/10/22 0300)   norepinephrine (LEVOPHED) Adult infusion 6 mcg/min (04/10/22 1216)   PRN Meds:.acetaminophen **OR** acetaminophen, ondansetron **OR** ondansetron (ZOFRAN) IV, mouth rinse  Antimicrobials: Anti-infectives (From admission, onward)    Start     Dose/Rate Route Frequency Ordered Stop   04/10/22 1500  ceFEPIme (MAXIPIME) 1 g in sodium chloride 0.9 % 100 mL IVPB        1 g 200 mL/hr over 30 Minutes Intravenous Every 24 hours 04/09/22 1711     04/10/22 0200  metroNIDAZOLE (FLAGYL) IVPB 500 mg        500 mg 100 mL/hr over 60  Minutes Intravenous Every 12 hours 04/09/22 1702 04/15/22 0159   04/09/22 1710  vancomycin variable dose per unstable renal function (pharmacist dosing)         Does not apply See admin instructions 04/09/22 1710     04/09/22 1415  ceFEPIme (MAXIPIME) 2 g in sodium chloride 0.9 % 100 mL IVPB        2 g 200 mL/hr over 30 Minutes Intravenous  Once 04/09/22 1408 04/09/22 1451   04/09/22 1415  metroNIDAZOLE (FLAGYL) IVPB 500 mg        500 mg 100 mL/hr over 60 Minutes Intravenous  Once 04/09/22 1408 04/09/22 1559   04/09/22 1415  vancomycin (VANCOCIN) IVPB 1000 mg/200 mL premix        1,000 mg 200 mL/hr over 60 Minutes Intravenous  Once 04/09/22 1408 04/09/22 1659        I have personally reviewed the following labs and images: CBC: Recent Labs  Lab 04/09/22 1212 04/10/22 0309  WBC 33.7* 29.9*  NEUTROABS 29.0*  --   HGB 15.1* 12.1  HCT 41.9 33.4*  MCV 86.4 87.0  PLT 205 153   BMP &GFR Recent Labs  Lab 04/09/22 1212 04/09/22 1932 04/10/22 0309  NA 135 133* 130*  K <2.0* 2.7* 4.0  CL 86* 91* 94*  CO2 31 26 25   GLUCOSE 162* 132* 137*  BUN 109* 100* 93*  CREATININE 4.46* 4.21* 4.06*  CALCIUM 9.2 8.5* 8.0*  MG 2.9*  --  2.0  PHOS  --  2.4* 1.3*   Estimated Creatinine Clearance: 7.2 mL/min (A) (by C-G formula based on SCr of 4.06 mg/dL (H)). Liver & Pancreas: Recent Labs  Lab 04/09/22 1212 04/09/22 1932 04/10/22 0309  AST 128*  --  137*  ALT 41  --  41  ALKPHOS 113  --  86  BILITOT 2.2*  --  1.9*  PROT 6.9  --  5.1*  ALBUMIN 3.6 2.9* 2.4*   No results for input(s): "LIPASE", "AMYLASE" in the last 168 hours. No results for input(s): "AMMONIA" in the last 168 hours. Diabetic: No results for input(s): "HGBA1C" in the last 72 hours. No results for input(s): "GLUCAP" in the last 168 hours. Cardiac Enzymes: Recent Labs  Lab 04/09/22 1602 04/10/22 0309  CKTOTAL 5,012* 612*   No results for input(s): "PROBNP" in the last 8760 hours. Coagulation Profile: Recent  Labs  Lab 04/09/22 1212  INR 1.0   Thyroid Function Tests: No results for input(s): "TSH", "T4TOTAL", "FREET4", "T3FREE", "THYROIDAB" in the last 72 hours. Lipid Profile: No results for input(s): "CHOL", "HDL", "LDLCALC", "TRIG", "CHOLHDL", "LDLDIRECT" in the last 72 hours. Anemia Panel: No results for input(s): "VITAMINB12", "FOLATE", "FERRITIN", "TIBC", "IRON", "RETICCTPCT"  in the last 72 hours. Urine analysis:    Component Value Date/Time   COLORURINE AMBER (A) 04/09/2022 1140   APPEARANCEUR CLOUDY (A) 04/09/2022 1140   LABSPEC 1.016 04/09/2022 1140   PHURINE 5.0 04/09/2022 1140   GLUCOSEU 50 (A) 04/09/2022 1140   GLUCOSEU NEGATIVE 11/16/2021 0903   HGBUR MODERATE (A) 04/09/2022 1140   BILIRUBINUR NEGATIVE 04/09/2022 1140   KETONESUR NEGATIVE 04/09/2022 1140   PROTEINUR 100 (A) 04/09/2022 1140   UROBILINOGEN 0.2 11/16/2021 0903   NITRITE NEGATIVE 04/09/2022 1140   LEUKOCYTESUR SMALL (A) 04/09/2022 1140   Sepsis Labs: Invalid input(s): "PROCALCITONIN", "LACTICIDVEN"  Microbiology: Recent Results (from the past 240 hour(s))  C Difficile Quick Screen w PCR reflex     Status: None   Collection Time: 04/09/22  6:07 PM   Specimen: STOOL  Result Value Ref Range Status   C Diff antigen NEGATIVE NEGATIVE Final   C Diff toxin NEGATIVE NEGATIVE Final   C Diff interpretation No C. difficile detected.  Final    Comment: Performed at [facility], [address] [place], [place] [address]  MRSA Next Gen by PCR, Nasal     Status: None   Collection Time: 04/09/22  6:28 PM   Specimen: Nasal Mucosa; Nasal Swab  Result Value Ref Range Status   MRSA by PCR Next Gen NOT DETECTED NOT DETECTED Final    Comment: (NOTE) The GeneXpert MRSA Assay (FDA approved for NASAL specimens only), is one component of a comprehensive MRSA colonization surveillance program. It is not intended to diagnose MRSA infection nor to guide or monitor treatment for MRSA infections. Test  performance is not FDA approved in patients less than [address]458 years old. Performed at [facility], [address] [place], [place] [address]     Radiology Studies: ECHOCARDIOGRAM COMPLETE  Result Date: 04/10/2022    ECHOCARDIOGRAM REPORT   Patient Name:   Armandina StammerSTHER M Bohnet Date of Exam: 04/10/2022 Medical Rec #:  [id number]         Height:       63.0 in Accession #:    [id number]        Weight:       131.0 lb Date of Birth:  [birth year]         BSA:          1.615 m Patient Age:    89 years          BP:           80/47 mmHg Patient Gender: F                 HR:           82 bpm. Exam Location:  Inpatient Procedure: 2D Echo, Cardiac Doppler and Color Doppler Indications:    Other cardiac sounds  History:        Patient has no prior history of Echocardiogram examinations.                 Risk Factors:Hypertension and Dyslipidemia.  Sonographer:    Eduard Rouxolleen Schwartz Referring Phys: [id number] Sinclaire Artiga T Yazaira Speas IMPRESSIONS  1. Left ventricular ejection fraction, by estimation, is 70 to 75%. The left ventricle has hyperdynamic function. The left ventricle has no regional wall motion abnormalities. There is severe concentric left ventricular hypertrophy. Left ventricular diastolic parameters are consistent with Grade I diastolic dysfunction (impaired relaxation). Mitral chordal SAM noted with turbulent LVOT flow, not clearly assessed for gradient.  2. Right ventricular systolic  function is normal. The right ventricular size is normal. There is normal pulmonary artery systolic pressure. The estimated right ventricular systolic pressure is 35.3 mmHg.  3. The mitral valve is abnormal. Mild mitral valve regurgitation.  4. The aortic valve is tricuspid. There is severe calcifcation of the aortic valve. Aortic valve regurgitation is not visualized. Moderate aortic valve stenosis, low gradient. Aortic valve area, by VTI measures 1.33 cm. Aortic valve mean gradient measures 14.5 mmHg. Aortic valve Vmax measures 2.68  m/s.  5. The inferior vena cava is normal in size with greater than 50% respiratory variability, suggesting right atrial pressure of 3 mmHg. Comparison(s): No prior Echocardiogram. FINDINGS  Left Ventricle: Left ventricular ejection fraction, by estimation, is 70 to 75%. The left ventricle has hyperdynamic function. The left ventricle has no regional wall motion abnormalities. The left ventricular internal cavity size was small. There is severe concentric left ventricular hypertrophy. Left ventricular diastolic parameters are consistent with Grade I diastolic dysfunction (impaired relaxation). Right Ventricle: The right ventricular size is normal. No increase in right ventricular wall thickness. Right ventricular systolic function is normal. There is normal pulmonary artery systolic pressure. The tricuspid regurgitant velocity is 2.84 m/s, and  with an assumed right atrial pressure of 3 mmHg, the estimated right ventricular systolic pressure is 35.3 mmHg. Left Atrium: Left atrial size was normal in size. Right Atrium: Right atrial size was normal in size. Pericardium: There is no evidence of pericardial effusion. Mitral Valve: The mitral valve is abnormal. There is mild thickening of the mitral valve leaflet(s). There is mild calcification of the mitral valve leaflet(s). Mild mitral annular calcification. Mild mitral valve regurgitation. Tricuspid Valve: The tricuspid valve is grossly normal. Tricuspid valve regurgitation is mild. Aortic Valve: The aortic valve is tricuspid. There is severe calcifcation of the aortic valve. There is mild aortic valve annular calcification. Aortic valve regurgitation is not visualized. Moderate aortic stenosis is present. Aortic valve mean gradient  measures 14.5 mmHg. Aortic valve peak gradient measures 28.7 mmHg. Aortic valve area, by VTI measures 1.33 cm. Pulmonic Valve: The pulmonic valve was grossly normal. Pulmonic valve regurgitation is mild. Aorta: The aortic root is normal  in size and structure. Venous: The inferior vena cava is normal in size with greater than 50% respiratory variability, suggesting right atrial pressure of 3 mmHg. IAS/Shunts: There is right bowing of the interatrial septum, suggestive of elevated left atrial pressure. No atrial level shunt detected by color flow Doppler.  LEFT VENTRICLE PLAX 2D LVIDd:         2.80 cm   Diastology LVIDs:         1.40 cm   LV e' medial:    3.21 cm/s LV PW:         1.50 cm   LV E/e' medial:  16.0 LV IVS:        1.60 cm   LV e' lateral:   5.57 cm/s LVOT diam:     1.70 cm   LV E/e' lateral: 9.2 LV SV:         57 LV SV Index:   35 LVOT Area:     2.27 cm  RIGHT VENTRICLE             IVC RV Basal diam:  3.00 cm     IVC diam: 1.40 cm RV S prime:     16.00 cm/s TAPSE (M-mode): 3.0 cm LEFT ATRIUM             Index  RIGHT ATRIUM           Index LA diam:        3.50 cm 2.17 cm/m   RA Area:     15.50 cm LA Vol (A2C):   35.2 ml 21.79 ml/m  RA Volume:   40.20 ml  24.89 ml/m LA Vol (A4C):   35.3 ml 21.86 ml/m LA Biplane Vol: 36.7 ml 22.72 ml/m  AORTIC VALVE                     PULMONIC VALVE AV Area (Vmax):    1.20 cm      PV Vmax:       0.89 m/s AV Area (Vmean):   1.14 cm      PV Peak grad:  3.2 mmHg AV Area (VTI):     1.33 cm AV Vmax:           268.00 cm/s AV Vmean:          174.500 cm/s AV VTI:            0.424 m AV Peak Grad:      28.7 mmHg AV Mean Grad:      14.5 mmHg LVOT Vmax:         142.00 cm/s LVOT Vmean:        87.600 cm/s LVOT VTI:          0.249 m LVOT/AV VTI ratio: 0.59  AORTA Ao Root diam: 3.10 cm Ao Asc diam:  3.60 cm MITRAL VALVE               TRICUSPID VALVE MV Area (PHT): 3.20 cm    TR Peak grad:   32.3 mmHg MV Decel Time: 237 msec    TR Vmax:        284.00 cm/s MV E velocity: 51.40 cm/s MV A velocity: 72.00 cm/s  SHUNTS MV E/A ratio:  0.71        Systemic VTI:  0.25 m                            Systemic Diam: 1.70 cm Nona Dell MD Electronically signed by Nona Dell MD Signature Date/Time:  04/10/2022/11:39:58 AM    Final    CT ABDOMEN PELVIS WO CONTRAST  Result Date: 04/09/2022 CLINICAL DATA:  Abdominal pain, diarrhea EXAM: CT ABDOMEN AND PELVIS WITHOUT CONTRAST TECHNIQUE: Multidetector CT imaging of the abdomen and pelvis was performed following the standard protocol without IV contrast. RADIATION DOSE REDUCTION: This exam was performed according to the departmental dose-optimization program which includes automated exposure control, adjustment of the mA and/or kV according to patient size and/or use of iterative reconstruction technique. COMPARISON:  None Available. FINDINGS: Lower chest: There is ectasia of bronchi in lingula. There are small scattered foci of increased interstitial markings in both lower lung fields. There is 11 mm alveolar density in lingula. There are other smaller scattered alveolar densities in both lower lung fields. Coronary artery calcifications are seen. Hepatobiliary: No focal abnormalities are seen in liver. There is no dilation of bile ducts. Gallbladder is distended. There is no wall thickening in gallbladder. Possible sludge is seen in the lumen of gallbladder. Pancreas: No focal abnormalities are seen. Spleen: Unremarkable. Adrenals/Urinary Tract: Adrenals are not enlarged. There is no hydronephrosis. There are no renal or ureteral stones. Urinary bladder is empty. Stomach/Bowel: Stomach is unremarkable. Small bowel loops are not dilated. There is mild diffuse wall thickening  in distal small bowel loops. Appendix is not dilated. Moderate to large amount of stool is seen in rectum. There is mild diffuse wall thickening in rectum. There is stranding in the fat planes around the rectum. Vascular/Lymphatic: Extensive arterial calcifications are seen. Reproductive: Uterus is not distinctly visualized. In image 61 of series 2, there is 2.4 cm fluid density structure in the right side of pelvis. Other: There is no ascites or pneumoperitoneum. Small umbilical hernia  containing fat is seen. Small right inguinal hernia containing fat is seen. Musculoskeletal: There is 50% decrease in height of body of L1 vertebra. IMPRESSION: There is moderate to large amount of stool is seen in the lumen of rectum. There is mild diffuse wall thickening in rectum along with perirectal stranding. Possibility of stercoral colitis is not excluded. There is mild diffuse wall thickening in few distal small bowel loops in right lower quadrant suggesting possible nonspecific inflammatory or infectious enteritis. There is no evidence of intestinal obstruction or pneumoperitoneum. There is no hydronephrosis. Appendix is not dilated. There is 2.4 cm smooth marginated fluid density structure in the right side of pelvis which may be ovarian cyst or an artifact due to partial volume averaging of fluid in partly contracted bowel loop. Follow-up pelvic sonogram in 3 months may be considered. There is subtle increased density in the dependent portion of gallbladder suggesting presence of sludge. There is no significant wall thickening in colon. There is no dilation of bile ducts. There is bronchiectasis in lingula. Scattered foci of increased interstitial markings in both lungs suggest scarring or interstitial pneumonia. There is 11 mm nodular density in lingula which may suggest pneumonia or underlying neoplastic process. Follow-up CT in 3 months may be considered. Coronary artery calcifications are seen. There is 50% decrease in height of body of L1 vertebra which may suggest recent or old compression fracture. Electronically Signed   By: Ernie Avena M.D.   On: 04/09/2022 14:59      Juliane Guest T. Jarrah Babich [facility]  If 7PM-7AM, please contact night-coverage [email or web address] 04/10/2022, 1:30 PM

## 2022-04-11 DIAGNOSIS — R571 Hypovolemic shock: Secondary | ICD-10-CM

## 2022-04-11 DIAGNOSIS — R579 Shock, unspecified: Secondary | ICD-10-CM

## 2022-04-11 DIAGNOSIS — I214 Non-ST elevation (NSTEMI) myocardial infarction: Principal | ICD-10-CM

## 2022-04-11 DIAGNOSIS — R34 Anuria and oliguria: Secondary | ICD-10-CM | POA: Diagnosis not present

## 2022-04-11 DIAGNOSIS — N179 Acute kidney failure, unspecified: Secondary | ICD-10-CM | POA: Diagnosis not present

## 2022-04-11 DIAGNOSIS — K5900 Constipation, unspecified: Secondary | ICD-10-CM | POA: Diagnosis not present

## 2022-04-11 DIAGNOSIS — I7121 Aneurysm of the ascending aorta, without rupture: Secondary | ICD-10-CM | POA: Diagnosis not present

## 2022-04-11 DIAGNOSIS — M6282 Rhabdomyolysis: Secondary | ICD-10-CM | POA: Diagnosis not present

## 2022-04-11 DIAGNOSIS — R7989 Other specified abnormal findings of blood chemistry: Secondary | ICD-10-CM | POA: Diagnosis not present

## 2022-04-11 LAB — COMPREHENSIVE METABOLIC PANEL
ALT: 41 U/L (ref 0–44)
AST: 94 U/L — ABNORMAL HIGH (ref 15–41)
Albumin: 2.2 g/dL — ABNORMAL LOW (ref 3.5–5.0)
Alkaline Phosphatase: 78 U/L (ref 38–126)
Anion gap: 10 (ref 5–15)
BUN: 94 mg/dL — ABNORMAL HIGH (ref 8–23)
CO2: 23 mmol/L (ref 22–32)
Calcium: 7.7 mg/dL — ABNORMAL LOW (ref 8.9–10.3)
Chloride: 95 mmol/L — ABNORMAL LOW (ref 98–111)
Creatinine, Ser: 4.71 mg/dL — ABNORMAL HIGH (ref 0.44–1.00)
GFR, Estimated: 8 mL/min — ABNORMAL LOW (ref >60)
Glucose, Bld: 113 mg/dL — ABNORMAL HIGH (ref 70–99)
Potassium: 3.8 mmol/L (ref 3.5–5.1)
Sodium: 128 mmol/L — ABNORMAL LOW (ref 135–145)
Total Bilirubin: 1.4 mg/dL — ABNORMAL HIGH (ref 0.3–1.2)
Total Protein: 4.7 g/dL — ABNORMAL LOW (ref 6.5–8.1)

## 2022-04-11 LAB — CK: Total CK: 1464 U/L — ABNORMAL HIGH (ref 38–234)

## 2022-04-11 LAB — CBC
HCT: 31.5 % — ABNORMAL LOW (ref 36.0–46.0)
Hemoglobin: 11.1 g/dL — ABNORMAL LOW (ref 12.0–15.0)
MCH: 31.4 pg (ref 26.0–34.0)
MCHC: 35.2 g/dL (ref 30.0–36.0)
MCV: 89 fL (ref 80.0–100.0)
Platelets: 139 10*3/uL — ABNORMAL LOW (ref 150–400)
RBC: 3.54 MIL/uL — ABNORMAL LOW (ref 3.87–5.11)
RDW: 13.3 % (ref 11.5–15.5)
WBC: 22.8 10*3/uL — ABNORMAL HIGH (ref 4.0–10.5)
nRBC: 0 % (ref 0.0–0.2)

## 2022-04-11 LAB — VANCOMYCIN, RANDOM: Vancomycin Rm: 12 ug/mL

## 2022-04-11 LAB — PHOSPHORUS: Phosphorus: 1.1 mg/dL — ABNORMAL LOW (ref 2.5–4.6)

## 2022-04-11 LAB — MAGNESIUM: Magnesium: 1.8 mg/dL (ref 1.7–2.4)

## 2022-04-11 MED ORDER — SODIUM PHOSPHATES 45 MMOLE/15ML IV SOLN
10.0000 mmol | Freq: Once | INTRAVENOUS | Status: AC
  Administered 2022-04-11: 10 mmol via INTRAVENOUS
  Filled 2022-04-11: qty 3.33

## 2022-04-11 MED ORDER — POLYETHYLENE GLYCOL 3350 17 G PO PACK
17.0000 g | PACK | Freq: Two times a day (BID) | ORAL | Status: DC
  Administered 2022-04-11 – 2022-04-13 (×4): 17 g via ORAL
  Filled 2022-04-11 (×4): qty 1

## 2022-04-11 MED ORDER — SODIUM CHLORIDE 0.9 % IV BOLUS
1500.0000 mL | Freq: Once | INTRAVENOUS | Status: AC
  Administered 2022-04-11: 1500 mL via INTRAVENOUS

## 2022-04-11 MED ORDER — LACTATED RINGERS IV SOLN: INTRAVENOUS | Status: AC

## 2022-04-11 MED ORDER — SODIUM CHLORIDE 0.9 % IV BOLUS
1000.0000 mL | Freq: Once | INTRAVENOUS | Status: AC
  Administered 2022-04-11: 1000 mL via INTRAVENOUS

## 2022-04-11 MED ORDER — LIP MEDEX EX OINT
TOPICAL_OINTMENT | CUTANEOUS | Status: DC | PRN
  Filled 2022-04-11: qty 7

## 2022-04-11 MED ORDER — GERHARDT'S BUTT CREAM
TOPICAL_CREAM | Freq: Two times a day (BID) | CUTANEOUS | Status: DC
  Administered 2022-04-11 – 2022-04-16 (×4): 1 via TOPICAL
  Filled 2022-04-11 (×3): qty 1

## 2022-04-11 MED ORDER — SODIUM CHLORIDE 0.9 % IV BOLUS: 2500.0000 mL | Freq: Once | INTRAVENOUS | Status: DC

## 2022-04-11 MED ORDER — BISACODYL 10 MG RE SUPP
10.0000 mg | Freq: Every day | RECTAL | Status: DC
  Administered 2022-04-11: 10 mg via RECTAL
  Filled 2022-04-11: qty 1

## 2022-04-11 NOTE — Consult Note (Addendum)
Renal Service Consult Note [facility]  Gloria [name] 04/11/2022 Sol Blazing, MD Requesting Physician: Dr. Cyndia Skeeters  Reason for Consult: Renal failure HPI: The patient is a 88 y.o. year-old w/ hx of GERD, HL, HTN, osteoporosis who presented on 9/30 w/ gen'd weakness, diarrhea. In ED BPs were in the 70s. Improved to 80s after 0.5 L NS bolus. Pt was admitted and started on levophed gtt. CT showed enteritis, stercolitis, mod-large stool burden, lingula bronchiectasis, biliary sludge and IS markings. Trop was up at 19,000. EKG neg and no cardiac symptoms. CPK was 6000. Pt admitted for nstemi, hypotension, dehydration and AKI.  IVF"s were given aggressively as well as IV abx and pt was admitted to ICU. Yesterday and today, only UOP was 100 cc and creat is up today to 4.7 (on admission was 4.4, b/l creat 0.7 in May 2023). Asked to see for renal failure.   Pt seen in room. Feels much better since admission. Minimal diarrhea now. Not voiding much here. No abd pain, no SP pain. No swelling or SOB or cough. Per family member, is eating today for the 1st time.   ROS - denies CP, no joint pain, no HA, no blurry vision, no rash, no diarrhea, no nausea/ vomiting   Past Medical History  Past Medical History:  Diagnosis Date   GERD 04/10/2007   Qualifier: Diagnosis of  By: Elveria Royals    HYPERLIPIDEMIA 04/13/2007   Qualifier: Diagnosis of  By: Jenny Reichmann MD, Hunt Oris    HYPERTENSION 04/10/2007   Qualifier: Diagnosis of  By: Elveria Royals    Impaired glucose tolerance 09/17/2011   OSTEOPOROSIS 04/10/2007   Qualifier: Diagnosis of  By: Elveria Royals    Unspecified vitamin D deficiency 08/26/2009   Centricity Description: UNSPECIFIED VITAMIN D DEFICIENCY Qualifier: Diagnosis of  By: Jenny Reichmann MD, Hunt Oris  Centricity Description: VITAMIN D DEFICIENCY Qualifier: Diagnosis of  By: Jenny Reichmann MD, Hunt Oris    Past Surgical History  Past Surgical History:  Procedure Laterality Date    CATARACT EXTRACTION     Family History  Family History  Problem Relation Age of Onset   Heart disease Father    Stroke Sister    Social History  reports that she has never smoked. She has never used smokeless tobacco. She reports that she does not drink alcohol and does not use drugs. Allergies No Known Allergies Home medications Prior to Admission medications   Medication Sig Start Date End Date Taking? Authorizing Provider  Cholecalciferol (THERA-D 2000) 50 MCG (2000 UT) TABS 1 tab by mouth once daily 11/11/20  Yes Biagio Borg, MD  lisinopril-hydrochlorothiazide (ZESTORETIC) 20-12.5 MG tablet Take 1 tablet by mouth daily. 11/16/21  Yes Biagio Borg, MD  rosuvastatin (CRESTOR) 40 MG tablet TAKE ONE TABLET BY MOUTH DAILY Patient taking differently: Take 40 mg by mouth daily. 11/16/21  Yes Biagio Borg, MD     Vitals:   04/11/22 1015 04/11/22 1030 04/11/22 1045 04/11/22 1100  BP: (!) 109/56  (!) 124/58 (!) 115/50  Pulse: 74 73 72 71  Resp: (!) 21 (!) 22 20 (!) 22  Temp:      TempSrc:      SpO2: 98% 98% 98% 98%  Weight:      Height:       Exam Gen alert, no distress, elderly female pleasant No rash, cyanosis or gangrene Sclera anicteric, throat dry and clear No jvd or bruits Chest clear bilat to bases, no rales/  wheezing RRR no MRG Abd soft ntnd no mass or ascites +bs GU defer MS no joint effusions or deformity Ext no LE or UE edema, no wounds or ulcers Neuro is alert, Ox 3 , nf      Home meds include - lisinopril/hctz, crestor, vit D, prns    B/l creat 0.75 from 11/16/21     UA cloudy, small LE, prot 100, no bact, 6-10rbc/ 21-50 wbc, 0-5 epi      CT abd /pelv (noncontrast) - Adrenals/Urinary Tract: Adrenals are not enlarged. There is no hydronephrosis. There are no renal or ureteral stones. Urinary bladder is empty.      I/O since admit = 5.8L + and 101 cc UOP = + 5.7 L      Na+ 128  K 3.8  CO2 23  BUN 94  creat 4.71  alb 2.2  LFT"s ^Alt 94, tbili 1.4  WBC 22k       Hb 11       IV levo low dose 9/30- 10/02 > 5 ug/ min today         IV abx > cefepime 9/30- current                        Flagyl 9/30 - current                        Vanc 1 gm on 9/30, dc'd          SBP's soft 70s- 90s, today 100- 110s          RA 98-100%   Assessment/ Plan: AKI - b/l creat 0.7 from May 2023.  Creat here 4.4 here in setting of acute diarrheal illness w/ shock / hypotension. SP levo gtt for < 24 hrs on admission. UA showed wbcs > rbcs, no bacteria. CT abd showed no obstruction. CPK was 6000 > 1000. Only has voided 100cc here w/ 5.7 L + I/O balance. Still looks dry on exam. BP's are normal now. Suspect AKI due to severe vol depletion + ACEi effect. ATN is possible as well. Plan rebolus today and cont IVF's at 75 /hr. Doubt retention but will bladder scan. Will follow. Have d/w family at bedside.  Shock - hypovolemic +/- sepsis. +Colitis on CT, also poss pna per CCM. Getting empiric IV abx. BP's wnl now.  Hyponatremia - hypovolemic, as above Hypophos - getting IV Naphos prn HTN - would avoid ACEi/ ARB / diuretics as rx for HTN in this frail elderly lady if possible. Suggest CCB or hydralazine as needed.       Rob Kadynce Bonds  MD 04/11/2022, 11:24 AM Recent Labs  Lab 04/10/22 0309 04/11/22 0553  HGB 12.1 11.1*  ALBUMIN 2.4* 2.2*  CALCIUM 8.0* 7.7*  PHOS 1.3* 1.1*  CREATININE 4.06* 4.71*  K 4.0 3.8   Inpatient medications:  bisacodyl  10 mg Rectal Daily   Chlorhexidine Gluconate Cloth  6 each Topical Daily   heparin injection (subcutaneous)  5,000 Units Subcutaneous Q8H   midodrine  5 mg Oral TID WC   polyethylene glycol  17 g Oral BID    sodium chloride Stopped (04/10/22 2054)   ceFEPime (MAXIPIME) IV Stopped (04/10/22 1443)   lactated ringers 75 mL/hr at 04/11/22 0900   metronidazole Stopped (04/11/22 0308)   norepinephrine (LEVOPHED) Adult infusion 5 mcg/min (04/11/22 0914)   sodium phosphate 10 mmol in dextrose 5 % 250 mL infusion     acetaminophen **  OR**  acetaminophen, ondansetron **OR** ondansetron (ZOFRAN) IV, mouth rinse

## 2022-04-11 NOTE — Progress Notes (Addendum)
Rounding Note    Patient Name: Gloria [name] Date of Encounter: 04/11/2022  [place] Cardiologist: Pixie Casino, MD   Subjective   Feeling well. No chest pain, sob or palpitations.    Inpatient Medications    Scheduled Meds:  Chlorhexidine Gluconate Cloth  6 each Topical Daily   heparin injection (subcutaneous)  5,000 Units Subcutaneous Q8H   midodrine  5 mg Oral TID WC   Continuous Infusions:  sodium chloride Stopped (04/10/22 2054)   ceFEPime (MAXIPIME) IV Stopped (04/10/22 1443)   lactated ringers 75 mL/hr at 04/11/22 0600   metronidazole Stopped (04/11/22 0308)   norepinephrine (LEVOPHED) Adult infusion 4 mcg/min (04/11/22 0600)   PRN Meds: acetaminophen **OR** acetaminophen, ondansetron **OR** ondansetron (ZOFRAN) IV, mouth rinse   Vital Signs    Vitals:   04/11/22 0615 04/11/22 0630 04/11/22 0645 04/11/22 0732  BP: (!) 113/53 (!) 114/54 (!) 107/52   Pulse: 70 66 68   Resp: 20 17 18    Temp:    (!) 97.5 F (36.4 C)  TempSrc:    Oral  SpO2: 97% 99% 98%   Weight:      Height:        Intake/Output Summary (Last 24 hours) at 04/11/2022 0837 Last data filed at 04/11/2022 0600 Gross per 24 hour  Intake 2062.93 ml  Output 100 ml  Net 1962.93 ml      04/11/2022    5:00 AM 04/10/2022    5:00 AM 04/09/2022   11:29 AM  Last 3 Weights  Weight (lbs) 115 lb 4.8 oz 130 lb 15.3 oz 117 lb  Weight (kg) 52.3 kg 59.4 kg 53.071 kg      Telemetry    NSR, PACs - Personally Reviewed  ECG    N/a  Physical Exam   GEN: elderly female in no acute distress.   Neck: No JVD Cardiac: RRR, no murmurs, rubs, or gallops.  Respiratory: Clear to auscultation bilaterally. GI: Soft, nontender, non-distended  MS: No edema; No deformity. Neuro:  Nonfocal  Psych: Normal affect   Labs    High Sensitivity Troponin:   Recent Labs  Lab 04/09/22 1212 04/09/22 1413  TROPONINIHS 18,829* 16,633*     Chemistry Recent Labs  Lab 04/09/22 1212  04/09/22 1932 04/10/22 0309 04/11/22 0553  NA 135 133* 130* 128*  K <2.0* 2.7* 4.0 3.8  CL 86* 91* 94* 95*  CO2 31 26 25 23   GLUCOSE 162* 132* 137* 113*  BUN 109* 100* 93* 94*  CREATININE 4.46* 4.21* 4.06* 4.71*  CALCIUM 9.2 8.5* 8.0* 7.7*  MG 2.9*  --  2.0 1.8  PROT 6.9  --  5.1* 4.7*  ALBUMIN 3.6 2.9* 2.4* 2.2*  AST 128*  --  137* 94*  ALT 41  --  41 41  ALKPHOS 113  --  86 78  BILITOT 2.2*  --  1.9* 1.4*  GFRNONAA 9* 9* 10* 8*  ANIONGAP 18* 16* 11 10    Hematology Recent Labs  Lab 04/09/22 1212 04/10/22 0309 04/11/22 0553  WBC 33.7* 29.9* 22.8*  RBC 4.85 3.84* 3.54*  HGB 15.1* 12.1 11.1*  HCT 41.9 33.4* 31.5*  MCV 86.4 87.0 89.0  MCH 31.1 31.5 31.4  MCHC 36.0 36.2* 35.2  RDW 12.5 13.0 13.3  PLT 205 153 139*   Radiology    ECHOCARDIOGRAM COMPLETE  Result Date: 04/10/2022    ECHOCARDIOGRAM REPORT   Patient Name:   JAWAHER BREE Date of Exam: 04/10/2022 Medical Rec #:  [id number]         Height:       63.0 in Accession #:    [id number]        Weight:       131.0 lb Date of Birth:  12-Dec-1926         BSA:          1.615 m Patient Age:    88 years          BP:           80/47 mmHg Patient Gender: F                 HR:           82 bpm. Exam Location:  Inpatient Procedure: 2D Echo, Cardiac Doppler and Color Doppler Indications:    Other cardiac sounds  History:        Patient has no prior history of Echocardiogram examinations.                 Risk Factors:Hypertension and Dyslipidemia.  Sonographer:    Jefferey Pica Referring Phys: [id number] TAYE T GONFA IMPRESSIONS  1. Left ventricular ejection fraction, by estimation, is 70 to 75%. The left ventricle has hyperdynamic function. The left ventricle has no regional wall motion abnormalities. There is severe concentric left ventricular hypertrophy. Left ventricular diastolic parameters are consistent with Grade I diastolic dysfunction (impaired relaxation). Mitral chordal SAM noted with turbulent LVOT flow, not clearly  assessed for gradient.  2. Right ventricular systolic function is normal. The right ventricular size is normal. There is normal pulmonary artery systolic pressure. The estimated right ventricular systolic pressure is 27.2 mmHg.  3. The mitral valve is abnormal. Mild mitral valve regurgitation.  4. The aortic valve is tricuspid. There is severe calcifcation of the aortic valve. Aortic valve regurgitation is not visualized. Moderate aortic valve stenosis, low gradient. Aortic valve area, by VTI measures 1.33 cm. Aortic valve mean gradient measures 14.5 mmHg. Aortic valve Vmax measures 2.68 m/s.  5. The inferior vena cava is normal in size with greater than 50% respiratory variability, suggesting right atrial pressure of 3 mmHg. Comparison(s): No prior Echocardiogram. FINDINGS  Left Ventricle: Left ventricular ejection fraction, by estimation, is 70 to 75%. The left ventricle has hyperdynamic function. The left ventricle has no regional wall motion abnormalities. The left ventricular internal cavity size was small. There is severe concentric left ventricular hypertrophy. Left ventricular diastolic parameters are consistent with Grade I diastolic dysfunction (impaired relaxation). Right Ventricle: The right ventricular size is normal. No increase in right ventricular wall thickness. Right ventricular systolic function is normal. There is normal pulmonary artery systolic pressure. The tricuspid regurgitant velocity is 2.84 m/s, and  with an assumed right atrial pressure of 3 mmHg, the estimated right ventricular systolic pressure is 53.6 mmHg. Left Atrium: Left atrial size was normal in size. Right Atrium: Right atrial size was normal in size. Pericardium: There is no evidence of pericardial effusion. Mitral Valve: The mitral valve is abnormal. There is mild thickening of the mitral valve leaflet(s). There is mild calcification of the mitral valve leaflet(s). Mild mitral annular calcification. Mild mitral valve  regurgitation. Tricuspid Valve: The tricuspid valve is grossly normal. Tricuspid valve regurgitation is mild. Aortic Valve: The aortic valve is tricuspid. There is severe calcifcation of the aortic valve. There is mild aortic valve annular calcification. Aortic valve regurgitation is not visualized. Moderate aortic stenosis is present. Aortic valve mean gradient  measures 14.5 mmHg. Aortic  valve peak gradient measures 28.7 mmHg. Aortic valve area, by VTI measures 1.33 cm. Pulmonic Valve: The pulmonic valve was grossly normal. Pulmonic valve regurgitation is mild. Aorta: The aortic root is normal in size and structure. Venous: The inferior vena cava is normal in size with greater than 50% respiratory variability, suggesting right atrial pressure of 3 mmHg. IAS/Shunts: There is right bowing of the interatrial septum, suggestive of elevated left atrial pressure. No atrial level shunt detected by color flow Doppler.  LEFT VENTRICLE PLAX 2D LVIDd:         2.80 cm   Diastology LVIDs:         1.40 cm   LV e' medial:    3.21 cm/s LV PW:         1.50 cm   LV E/e' medial:  16.0 LV IVS:        1.60 cm   LV e' lateral:   5.57 cm/s LVOT diam:     1.70 cm   LV E/e' lateral: 9.2 LV SV:         57 LV SV Index:   35 LVOT Area:     2.27 cm  RIGHT VENTRICLE             IVC RV Basal diam:  3.00 cm     IVC diam: 1.40 cm RV S prime:     16.00 cm/s TAPSE (M-mode): 3.0 cm LEFT ATRIUM             Index        RIGHT ATRIUM           Index LA diam:        3.50 cm 2.17 cm/m   RA Area:     15.50 cm LA Vol (A2C):   35.2 ml 21.79 ml/m  RA Volume:   40.20 ml  24.89 ml/m LA Vol (A4C):   35.3 ml 21.86 ml/m LA Biplane Vol: 36.7 ml 22.72 ml/m  AORTIC VALVE                     PULMONIC VALVE AV Area (Vmax):    1.20 cm      PV Vmax:       0.89 m/s AV Area (Vmean):   1.14 cm      PV Peak grad:  3.2 mmHg AV Area (VTI):     1.33 cm AV Vmax:           268.00 cm/s AV Vmean:          174.500 cm/s AV VTI:            0.424 m AV Peak Grad:      28.7  mmHg AV Mean Grad:      14.5 mmHg LVOT Vmax:         142.00 cm/s LVOT Vmean:        87.600 cm/s LVOT VTI:          0.249 m LVOT/AV VTI ratio: 0.59  AORTA Ao Root diam: 3.10 cm Ao Asc diam:  3.60 cm MITRAL VALVE               TRICUSPID VALVE MV Area (PHT): 3.20 cm    TR Peak grad:   32.3 mmHg MV Decel Time: 237 msec    TR Vmax:        284.00 cm/s MV E velocity: 51.40 cm/s MV A velocity: 72.00 cm/s  SHUNTS MV E/A ratio:  0.71  Systemic VTI:  0.25 m                            Systemic Diam: 1.70 cm Rozann Lesches MD Electronically signed by Rozann Lesches MD Signature Date/Time: 04/10/2022/11:39:58 AM    Final    CT ABDOMEN PELVIS WO CONTRAST  Result Date: 04/09/2022 CLINICAL DATA:  Abdominal pain, diarrhea EXAM: CT ABDOMEN AND PELVIS WITHOUT CONTRAST TECHNIQUE: Multidetector CT imaging of the abdomen and pelvis was performed following the standard protocol without IV contrast. RADIATION DOSE REDUCTION: This exam was performed according to the departmental dose-optimization program which includes automated exposure control, adjustment of the mA and/or kV according to patient size and/or use of iterative reconstruction technique. COMPARISON:  None Available. FINDINGS: Lower chest: There is ectasia of bronchi in lingula. There are small scattered foci of increased interstitial markings in both lower lung fields. There is 11 mm alveolar density in lingula. There are other smaller scattered alveolar densities in both lower lung fields. Coronary artery calcifications are seen. Hepatobiliary: No focal abnormalities are seen in liver. There is no dilation of bile ducts. Gallbladder is distended. There is no wall thickening in gallbladder. Possible sludge is seen in the lumen of gallbladder. Pancreas: No focal abnormalities are seen. Spleen: Unremarkable. Adrenals/Urinary Tract: Adrenals are not enlarged. There is no hydronephrosis. There are no renal or ureteral stones. Urinary bladder is empty. Stomach/Bowel:  Stomach is unremarkable. Small bowel loops are not dilated. There is mild diffuse wall thickening in distal small bowel loops. Appendix is not dilated. Moderate to large amount of stool is seen in rectum. There is mild diffuse wall thickening in rectum. There is stranding in the fat planes around the rectum. Vascular/Lymphatic: Extensive arterial calcifications are seen. Reproductive: Uterus is not distinctly visualized. In image 61 of series 2, there is 2.4 cm fluid density structure in the right side of pelvis. Other: There is no ascites or pneumoperitoneum. Small umbilical hernia containing fat is seen. Small right inguinal hernia containing fat is seen. Musculoskeletal: There is 50% decrease in height of body of L1 vertebra. IMPRESSION: There is moderate to large amount of stool is seen in the lumen of rectum. There is mild diffuse wall thickening in rectum along with perirectal stranding. Possibility of stercoral colitis is not excluded. There is mild diffuse wall thickening in few distal small bowel loops in right lower quadrant suggesting possible nonspecific inflammatory or infectious enteritis. There is no evidence of intestinal obstruction or pneumoperitoneum. There is no hydronephrosis. Appendix is not dilated. There is 2.4 cm smooth marginated fluid density structure in the right side of pelvis which may be ovarian cyst or an artifact due to partial volume averaging of fluid in partly contracted bowel loop. Follow-up pelvic sonogram in 3 months may be considered. There is subtle increased density in the dependent portion of gallbladder suggesting presence of sludge. There is no significant wall thickening in colon. There is no dilation of bile ducts. There is bronchiectasis in lingula. Scattered foci of increased interstitial markings in both lungs suggest scarring or interstitial pneumonia. There is 11 mm nodular density in lingula which may suggest pneumonia or underlying neoplastic process. Follow-up  CT in 3 months may be considered. Coronary artery calcifications are seen. There is 50% decrease in height of body of L1 vertebra which may suggest recent or old compression fracture. Electronically Signed   By: Elmer Picker M.D.   On: 04/09/2022 14:59   DG Chest  Port 1 View  Result Date: 04/09/2022 CLINICAL DATA:  Questionable sepsis-evaluate for abnormality. Diarrhea, sepsis, hypotension. EXAM: PORTABLE CHEST 1 VIEW COMPARISON:  Chest CT 09/25/2017.  Chest CT 09/12/2017. FINDINGS: A portion of the left lateral costophrenic angle is excluded from the field of view. Heart size within normal limits. Aortic atherosclerosis. 3 nodular opacities (measuring up to 5 mm) project in the region of the right upper lobe. Elsewhere, there is no appreciable airspace consolidation. No evidence of pleural effusion or pneumothorax. No acute bony abnormality identified. IMPRESSION: A portion of the left lateral costophrenic angle is excluded from the field of view. Three apparent right upper lobe pulmonary nodules. These may be infectious/inflammatory in etiology given the findings on the prior chest CT of 09/25/2017. However, pulmonary neoplasm cannot be excluded a chest CT is recommended for further characterization. Aortic Atherosclerosis (ICD10-I70.0). Electronically Signed   By: Kellie Simmering D.O.   On: 04/09/2022 12:34    Cardiac Studies   Echo 04/10/2022  1. Left ventricular ejection fraction, by estimation, is 70 to 75%. The  left ventricle has hyperdynamic function. The left ventricle has no  regional wall motion abnormalities. There is severe concentric left  ventricular hypertrophy. Left ventricular  diastolic parameters are consistent with Grade I diastolic dysfunction  (impaired relaxation). Mitral chordal SAM noted with turbulent LVOT flow,  not clearly assessed for gradient.   2. Right ventricular systolic function is normal. The right ventricular  size is normal. There is normal pulmonary artery  systolic pressure. The  estimated right ventricular systolic pressure is [id number] mmHg.   3. The mitral valve is abnormal. Mild mitral valve regurgitation.   4. The aortic valve is tricuspid. There is severe calcifcation of the  aortic valve. Aortic valve regurgitation is not visualized. Moderate  aortic valve stenosis, low gradient. Aortic valve area, by VTI measures  1.33 cm. Aortic valve mean gradient  measures 14.5 mmHg. Aortic valve Vmax measures 2.68 m/s.   5. The inferior vena cava is normal in size with greater than 50%  respiratory variability, suggesting right atrial pressure of 3 mmHg.   Comparison(s): No prior Echocardiogram.   Patient Profile     88 y.o. female HTN, HLD, osteoporosis, AAA and GERD presented for the evaluation of fatigue and diarrhea who found to have a sepsis with SBP in 62s.   Assessment & Plan    Elevated troponin / NSTEMI Hypotension  - Troponin 18,800>> 16,600 in setting of rhabdomyolysis with CK of 5012>>612>>1464. - No reported chest pain or dyspnea. Patient is very active at baseline.  - Echo showed hyperdynamic LV function at 70-75% and grade 1 DD. Mitral chordal SAM noted with turbulent LVOT flow, not clearly assessed for gradient. Severe LVH.  - Suspect demand ischemia in the setting of severe sepsis of unclear etiology but suspected infectious colitis. There is right bowing of the interatrial septum, suggestive of  elevated left atrial pressure. - She is able to lay flat. Denies dyspnea or orthopnea.  - Blood pressure improving on Levophed >> can wean off  3. AKI - Felt due to severe dehydration. Given fluids - Scr remained elevated 4.46>>4.21>>4.06>>4.71  4. Aortic stenosis - Echo showed moderate AS with with mean gradient of 14.66mm Hg.    For questions or updates, please contact [place] Please consult [email or web address] for contact info under        Jarrett Soho, PA  04/11/2022, 8:37 AM     Attending Note:    The patient was  seen and examined.  Agree with assessment and plan as noted above.  Changes made to the above note as needed.  Patient seen and independently examined with Vin bhagat, PA .   We discussed all aspects of the encounter. I agree with the assessment and plan as stated above.   NSTEMI:  likely demand ischemia in the setting of severel LVH, colitis,   no CP,  no plans for ischemic work up   2.   Hypotension:  better.  Ok to wean Levophed to off  3.  AKI:   creatinine is 4.7.   further management per IM.   Likely volume depleted.   4.  Dynamic LVOT obstruction :  LVH .  Will improve with more volume .     I have spent a total of 40 minutes with patient reviewing hospital  notes , telemetry, EKGs, labs and examining patient as well as establishing an assessment and plan that was discussed with the patient.  > 50% of time was spent in direct patient care.     Thayer Headings, Brooke Bonito., MD, [facility] 04/11/2022, 9:07 AM [address],  [place] Pager [phone]

## 2022-04-11 NOTE — Progress Notes (Addendum)
PROGRESS NOTE  Gloria [name]:[id number] DOB: [birth year]   PCP: [name], MD  Patient is from: Home.  Lives alone.  Very independent and active at baseline.  DOA: 04/09/2022 LOS: 2  Chief complaints Chief Complaint  Patient presents with   Hypotension   Diarrhea     Brief Narrative / Interim history: 88 year old F with PMH of HTN, AAA, heart murmur, GERD and osteoporosis brought to ED by EMS due to weakness and diarrhea for 4 to 5 days.  She was hypotensive to 70s when EMS arrived but improved to 80s after 500 cc bolus.  She had significant leukocytosis with lactic acidosis raising concern for infection.  She is also in severe AKI with azotemia.  CT abdomen and pelvis suggests enteritis, stercolitis, moderate to large stool burden, 2.4 cm fluid density in right pelvis, biliary sludge and lingula bronchiectasis with interstitial marking.  She also have markedly elevated troponin to 19,000 that has improved to 17,000.  She had no cardiopulmonary symptoms or EKG findings to suggest ACS.  CK was elevated to 6000 as well.  UA does not suggest UTI.  She was admitted with working diagnosis of non-STEMI, hypotension, severe dehydration, AKI.  She was started on rigorous IV fluid, broad-spectrum antibiotics and admitted.    Started on Levophed peripherally the night of admission due to hypotension.  Blood culture, C. difficile, GIP, MRSA PCR screen negative.  TTE without significant finding.  Patient was started on Levophed peripherally due to hypotension.  Renal function worse after interval improvement.  Nephrology and PCCM consulted.  Remains on cefepime and Flagyl.  Subjective: Seen and examined earlier this morning.  No major events overnight of this morning.  No complaints.  Some liquidy bowel movements.  Small unmeasured urine output this morning.  She had only 100 cc output yesterday.  Seen by nephrology.   Objective: Vitals:   04/11/22 1300 04/11/22 1315 04/11/22 1330 04/11/22  1445  BP: (!) 90/38 (!) 83/32 130/64 (!) 134/59  Pulse: 70 69 72 63  Resp: 19 16    Temp:      TempSrc:      SpO2: 97% 99% 99% 99%  Weight:      Height:        Examination:  GENERAL: No apparent distress.  Nontoxic. HEENT: MMM.  Vision and hearing grossly intact.  NECK: Supple.  No apparent JVD.  RESP:  No IWOB.  Fair aeration bilaterally. CVS:  RRR.  2/6 SEM over LUSB and LLSB.  Heart sounds normal.  ABD/GI/GU: BS+. Abd soft, NTND.  MSK/EXT:  Moves extremities. No apparent deformity. No edema.  SKIN: no apparent skin lesion or wound NEURO: Awake and alert. Oriented appropriately.  No apparent focal neuro deficit. PSYCH: Calm. Normal affect.   Procedures:  None  Microbiology summarized: MRSA PCR screen negative. C. difficile negative. Blood cultures NGTD GIP negative.  Assessment and plan: Principal Problem:   Elevated troponin Active Problems:   Essential hypertension   GERD   Heart murmur   Ascending aortic aneurysm (HCC)   AKI (acute kidney injury) (HCC)   Azotemia   Generalized weakness   Bandemia   Hypokalemia   Elevated AST (SGOT)   Diarrhea   Enteritis   Constipation   Stercoral colitis   Dehydration   Hypotension   Lactic acidosis   Rhabdomyolysis   Anuria   NSTEMI (non-ST elevated myocardial infarction) (HCC)   Shock (HCC)  Markedly elevated troponin/possible non-STEMI: No cardiopulmonary symptoms or signs of ischemia  on EKG. She is also severely dehydrated with severe AKI which might be contributing.  Also concern for infectious process but no clear source other than some enteritis.  TTE with LVEF of 70 to 75%, G1 DD and moderate AVS.  -Appreciate input by cardiology   Anuric AKI/azotemia: Likely prerenal in the setting of GI loss and concurrent use of ACE inhibitors and HCTZ.  No signs of obstruction on CT. CK 6000>>> 1000.  Minimal urine output. Recent Labs    11/16/21 0903 04/09/22 1212 04/09/22 1932 04/10/22 0309 04/11/22 0553  BUN  14 109* 100* 93* 94*  CREATININE 0.75 4.46* 4.21* 4.06* 4.71*  -Hold HCTZ and ACE inhibitor's -Appreciate nephrology input-rebolus and continue IVF at 75 -Strict intake and output -Bladder scan every 8 hours -Discontinued vancomycin.   Severe dehydration/hypovolemic shock: Dehydration improved.  Started on Levophed via PIV for hypotension but not symptomatic.  -Appreciate help by PCCM -Continue low-dose midodrine -IV fluid as above -Weaning of Levophed.   Diarrhea/enteritis/possible stercolitis/constipation: C. difficile and GIP negative.  Abdominal exam benign. -Soapsuds enema -P.o. MiraLAX and Dulcolax suppository -Continue cefepime and Flagyl  Leukocytosis/bandemia/lactic acidosis: Most likely from dehydration, AKI but cannot rule out infectious cause yet.  Diarrhea and CT findings suggest enteritis and stercolitis but abdominal exam benign.  No respiratory or UTI symptoms.  C. difficile and GIP negative.   -Continue cefepime and Flagyl  Nontraumatic rhabdomyolysis/elevated AST: AST improved.  CK slightly elevated today -IV fluid as above -Continue monitoring   Abnormal CT-some concern about lung nodules in RUL and 2.4 cm fluid density in pelvis suggesting ovarian cyst or an artifact. -Outpatient follow-up for RUL nodules -Follow-up pelvic sonogram in 3 months for pelvic cyst   Generalized weakness-in the setting of dehydration, diarrhea, AKI, azotemia... Patient is very independent and active at baseline -PT/OT eval   Heart murmur/moderate aortic valve stenosis/dynamic LVOT obstruction: TTE as above.   Goal of care discussion: Discussed CODE STATUS including pros and cons of CPR and intubation.  Patient prefers to be DNR.  Nephew, HCPOA in agreement.  Hypokalemia/hypophosphatemia/hyponatremia:  -Monitor replenish as appropriate -Nephrology on board  Increased nutrient needs Body mass index is 20.42 kg/m. -Consult dietitian   Pressure skin injury: POA Pressure  Injury 04/09/22 Buttocks Right Deep Tissue Pressure Injury - Purple or maroon localized area of discolored intact skin or blood-filled blister due to damage of underlying soft tissue from pressure and/or shear. 6X2.5cm (Active)  04/09/22 1815  Location: Buttocks  Location Orientation: Right  Staging: Deep Tissue Pressure Injury - Purple or maroon localized area of discolored intact skin or blood-filled blister due to damage of underlying soft tissue from pressure and/or shear.  Wound Description (Comments): 6X2.5cm  Present on Admission: Yes  Dressing Type Foam - Lift dressing to assess site every shift 04/11/22 0800   DVT prophylaxis:  heparin injection 5,000 Units Start: 04/10/22 1415    Code Status: DNR/DNI Family Communication: Updated patient's nephew, H POA at bedside Level of care: Stepdown Status is: Inpatient Remains inpatient appropriate because: Severe dehydration, hypotension, AKI   Final disposition: Remains in ICU/stepdown Consultants:  Pulmonology Cardiology Nephrology  Sch Meds:  Scheduled Meds:  bisacodyl  10 mg Rectal Daily   Chlorhexidine Gluconate Cloth  6 each Topical Daily   Gerhardt's butt cream   Topical BID   heparin injection (subcutaneous)  5,000 Units Subcutaneous Q8H   midodrine  5 mg Oral TID WC   polyethylene glycol  17 g Oral BID   Continuous Infusions:  sodium chloride Stopped (04/11/22 1227)   ceFEPime (MAXIPIME) IV Stopped (04/10/22 1443)   lactated ringers 75 mL/hr at 04/11/22 1322   metronidazole 500 mg (04/11/22 1455)   norepinephrine (LEVOPHED) Adult infusion 4 mcg/min (04/11/22 1322)   sodium chloride     sodium phosphate 10 mmol in dextrose 5 % 250 mL infusion 42 mL/hr at 04/11/22 1322   PRN Meds:.acetaminophen **OR** acetaminophen, lip balm, ondansetron **OR** ondansetron (ZOFRAN) IV, mouth rinse  Antimicrobials: Anti-infectives (From admission, onward)    Start     Dose/Rate Route Frequency Ordered Stop   04/10/22 1500   ceFEPIme (MAXIPIME) 1 g in sodium chloride 0.9 % 100 mL IVPB        1 g 200 mL/hr over 30 Minutes Intravenous Every 24 hours 04/09/22 1711     04/10/22 0200  metroNIDAZOLE (FLAGYL) IVPB 500 mg        500 mg 100 mL/hr over 60 Minutes Intravenous Every 12 hours 04/09/22 1702 04/15/22 0159   04/09/22 1710  vancomycin variable dose per unstable renal function (pharmacist dosing)  Status:  Discontinued         Does not apply See admin instructions 04/09/22 1710 04/11/22 0737   04/09/22 1415  ceFEPIme (MAXIPIME) 2 g in sodium chloride 0.9 % 100 mL IVPB        2 g 200 mL/hr over 30 Minutes Intravenous  Once 04/09/22 1408 04/09/22 1451   04/09/22 1415  metroNIDAZOLE (FLAGYL) IVPB 500 mg        500 mg 100 mL/hr over 60 Minutes Intravenous  Once 04/09/22 1408 04/09/22 1559   04/09/22 1415  vancomycin (VANCOCIN) IVPB 1000 mg/200 mL premix        1,000 mg 200 mL/hr over 60 Minutes Intravenous  Once 04/09/22 1408 04/09/22 1659        I have personally reviewed the following labs and images: CBC: Recent Labs  Lab 04/09/22 1212 04/10/22 0309 04/11/22 0553  WBC 33.7* 29.9* 22.8*  NEUTROABS 29.0*  --   --   HGB 15.1* 12.1 11.1*  HCT 41.9 33.4* 31.5*  MCV 86.4 87.0 89.0  PLT 205 153 139*   BMP &GFR Recent Labs  Lab 04/09/22 1212 04/09/22 1932 04/10/22 0309 04/11/22 0553  NA 135 133* 130* 128*  K <2.0* 2.7* 4.0 3.8  CL 86* 91* 94* 95*  CO2 31 26 25 23   GLUCOSE 162* 132* 137* 113*  BUN 109* 100* 93* 94*  CREATININE 4.46* 4.21* 4.06* 4.71*  CALCIUM 9.2 8.5* 8.0* 7.7*  MG 2.9*  --  2.0 1.8  PHOS  --  2.4* 1.3* 1.1*   Estimated Creatinine Clearance: 6.2 mL/min (A) (by C-G formula based on SCr of 4.71 mg/dL (H)). Liver & Pancreas: Recent Labs  Lab 04/09/22 1212 04/09/22 1932 04/10/22 0309 04/11/22 0553  AST 128*  --  137* 94*  ALT 41  --  41 41  ALKPHOS 113  --  86 78  BILITOT 2.2*  --  1.9* 1.4*  PROT 6.9  --  5.1* 4.7*  ALBUMIN 3.6 2.9* 2.4* 2.2*   No results for  input(s): "LIPASE", "AMYLASE" in the last 168 hours. No results for input(s): "AMMONIA" in the last 168 hours. Diabetic: No results for input(s): "HGBA1C" in the last 72 hours. No results for input(s): "GLUCAP" in the last 168 hours. Cardiac Enzymes: Recent Labs  Lab 04/09/22 1602 04/10/22 0309 04/11/22 0553  CKTOTAL 5,012* 612* 1,464*   No results for input(s): "PROBNP" in the last 8760  hours. Coagulation Profile: Recent Labs  Lab 04/09/22 1212  INR 1.0   Thyroid Function Tests: No results for input(s): "TSH", "T4TOTAL", "FREET4", "T3FREE", "THYROIDAB" in the last 72 hours. Lipid Profile: No results for input(s): "CHOL", "HDL", "LDLCALC", "TRIG", "CHOLHDL", "LDLDIRECT" in the last 72 hours. Anemia Panel: No results for input(s): "VITAMINB12", "FOLATE", "FERRITIN", "TIBC", "IRON", "RETICCTPCT" in the last 72 hours. Urine analysis:    Component Value Date/Time   COLORURINE AMBER (A) 04/09/2022 1140   APPEARANCEUR CLOUDY (A) 04/09/2022 1140   LABSPEC 1.016 04/09/2022 1140   PHURINE 5.0 04/09/2022 1140   GLUCOSEU 50 (A) 04/09/2022 1140   GLUCOSEU NEGATIVE 11/16/2021 0903   HGBUR MODERATE (A) 04/09/2022 1140   BILIRUBINUR NEGATIVE 04/09/2022 1140   KETONESUR NEGATIVE 04/09/2022 1140   PROTEINUR 100 (A) 04/09/2022 1140   UROBILINOGEN 0.2 11/16/2021 0903   NITRITE NEGATIVE 04/09/2022 1140   LEUKOCYTESUR SMALL (A) 04/09/2022 1140   Sepsis Labs: Invalid input(s): "PROCALCITONIN", "LACTICIDVEN"  Microbiology: Recent Results (from the past 240 hour(s))  Blood Culture (routine x 2)     Status: None (Preliminary result)   Collection Time: 04/09/22 11:55 AM   Specimen: BLOOD  Result Value Ref Range Status   Specimen Description   Final    BLOOD RIGHT ANTECUBITAL Performed at [facility], [address] [place], [place] [address]    Special Requests   Final    BOTTLES DRAWN AEROBIC AND ANAEROBIC Blood Culture adequate volume Performed at [facility], [address] [place], [place] [address]    Culture   Final    NO GROWTH 2 DAYS Performed at [facility], [address] [place], [place] [address]    Report Status PENDING  Incomplete  Blood Culture (routine x 2)     Status: None (Preliminary result)   Collection Time: 04/09/22 12:00 PM   Specimen: BLOOD  Result Value Ref Range Status   Specimen Description   Final    BLOOD BLOOD LEFT HAND Performed at [facility], [address] [place], [place] [address]    Special Requests   Final    BOTTLES DRAWN AEROBIC ONLY Blood Culture results may not be optimal due to an inadequate volume of blood received in culture bottles Performed at [facility], [address] [place], [place] [address]    Culture   Final    NO GROWTH 2 DAYS Performed at [facility], [address] [place], [place] [address]    Report Status PENDING  Incomplete  C Difficile Quick Screen w PCR reflex     Status: None   Collection Time: 04/09/22  6:07 PM   Specimen: STOOL  Result Value Ref Range Status   C Diff antigen NEGATIVE NEGATIVE Final   C Diff toxin NEGATIVE NEGATIVE Final   C Diff interpretation No C. difficile detected.  Final    Comment: Performed at [facility], [address] [place], [place] [address]  Gastrointestinal Panel by PCR , Stool     Status: None   Collection Time: 04/09/22  6:07 PM   Specimen: STOOL  Result Value Ref Range Status   Campylobacter species NOT DETECTED NOT DETECTED Final   Plesimonas shigelloides NOT DETECTED NOT DETECTED Final   Salmonella species NOT DETECTED NOT DETECTED Final   Yersinia enterocolitica NOT DETECTED NOT DETECTED Final   Vibrio species NOT DETECTED NOT DETECTED Final   Vibrio cholerae NOT DETECTED NOT DETECTED Final  Enteroaggregative E coli (EAEC) NOT DETECTED NOT DETECTED Final   Enteropathogenic E coli (EPEC) NOT DETECTED NOT DETECTED Final    Enterotoxigenic E coli (ETEC) NOT DETECTED NOT DETECTED Final   Shiga like toxin producing E coli (STEC) NOT DETECTED NOT DETECTED Final   Shigella/Enteroinvasive E coli (EIEC) NOT DETECTED NOT DETECTED Final   Cryptosporidium NOT DETECTED NOT DETECTED Final   Cyclospora cayetanensis NOT DETECTED NOT DETECTED Final   Entamoeba histolytica NOT DETECTED NOT DETECTED Final   Giardia lamblia NOT DETECTED NOT DETECTED Final   Adenovirus F40/41 NOT DETECTED NOT DETECTED Final   Astrovirus NOT DETECTED NOT DETECTED Final   Norovirus GI/GII NOT DETECTED NOT DETECTED Final   Rotavirus A NOT DETECTED NOT DETECTED Final   Sapovirus (I, II, IV, and V) NOT DETECTED NOT DETECTED Final    Comment: Performed at [facility], [address] [place], [place] [address]  MRSA Next Gen by PCR, Nasal     Status: None   Collection Time: 04/09/22  6:28 PM   Specimen: Nasal Mucosa; Nasal Swab  Result Value Ref Range Status   MRSA by PCR Next Gen NOT DETECTED NOT DETECTED Final    Comment: (NOTE) The GeneXpert MRSA Assay (FDA approved for NASAL specimens only), is one component of a comprehensive MRSA colonization surveillance program. It is not intended to diagnose MRSA infection nor to guide or monitor treatment for MRSA infections. Test performance is not FDA approved in patients less than 63 years old. Performed at [facility], [place] [address] [place], [place] [address]     Radiology Studies: No results found.    Danya Spearman T. Clarkrange  If 7PM-7AM, please contact night-coverage [email or web address] 04/11/2022, 2:59 PM

## 2022-04-11 NOTE — Evaluation (Signed)
Physical Therapy Evaluation Patient Details Name: Gloria [name] MRN: [id number] DOB: 01/03/1927 Today's Date: 04/11/2022  History of Present Illness  88 year old F admitted 04/09/22 with weakness and diarrhea for 4 to 5 days. Dx with elevated troponin, severe sepsis, severe AKI, hypotension, severe dehydration PMH includes HTN, AAA, heart murmur, GERD and osteoporosis  Clinical Impression  On eval, pt required Mod A +2 for mobility. She was able to sit EOB for at least 8 minutes. Repeated loss of balance posteriorly while sitting EOB-Min-Mod A to maintain static sitting. Pt presents with general weakness, decreased activity tolerance, and impaired gait and balance. Pt lives alone at baseline. PT recommendation is for ST SNF. Will plan to follow pt during this hospital stay.        Recommendations for follow up therapy are one component of a multi-disciplinary discharge planning process, led by the attending physician.  Recommendations may be updated based on patient status, additional functional criteria and insurance authorization.  Follow Up Recommendations Skilled nursing-short term rehab (<3 hours/day) Can patient physically be transported by private vehicle: No    Assistance Recommended at Discharge Frequent or constant Supervision/Assistance  Patient can return home with the following  Two people to help with walking and/or transfers;Two people to help with bathing/dressing/bathroom;Assistance with cooking/housework;Assist for transportation;Help with stairs or ramp for entrance    Equipment Recommendations  (TBD at next venue)  Recommendations for Other Services  OT consult    Functional Status Assessment Patient has had a recent decline in their functional status and demonstrates the ability to make significant improvements in function in a reasonable and predictable amount of time.     Precautions / Restrictions Precautions Precautions: Fall Precaution Comments: monitor  BP Restrictions Weight Bearing Restrictions: No      Mobility  Bed Mobility Overal bed mobility: Needs Assistance Bed Mobility: Rolling, Supine to Sit, Sit to Supine Rolling: +2 for physical assistance, +2 for safety/equipment, Min assist   Supine to sit: Mod assist, +2 for safety/equipment, +2 for physical assistance, HOB elevated Sit to supine: Max assist, +2 for physical assistance, +2 for safety/equipment   General bed mobility comments: multimodal cues required. assist for trunk and bil LEs. utilized bedpad to assist with scooting, positioning at EOB. Pt had significant difficulty maintaining static sitting balance-improved with time and better positioning however she has delayed reaction with repeated LOB posteriorly    Transfers                   General transfer comment: did not attempt due for safety this session, falling asleep while sitting EOB, low BP    Ambulation/Gait                  Stairs            Wheelchair Mobility    Modified Rankin (Stroke Patients Only)       Balance Overall balance assessment: Needs assistance Sitting-balance support: Bilateral upper extremity supported, Feet supported Sitting balance-Leahy Scale: Poor Sitting balance - Comments: min to mod A for EOB, can engage if given a "target time" to maintain her own balance - then fatigues and requires A to prevent LOB. delayed reaction.                                     Pertinent Vitals/Pain Pain Assessment Pain Assessment: No/denies pain    Home Living Family/patient expects to be  discharged to:: Private residence Living Arrangements: Alone Available Help at Discharge: Family;Available 24 hours/day (nephew lives next door) Type of Home: House Home Access: Level entry       Home Layout: One level Home Equipment: None Additional Comments: nephew does grocery shopping, she does not drive    Prior Function Prior Level of Function :  Independent/Modified Independent;Patient poor historian/Family not available             Mobility Comments: reports no DME ADLs Comments: independent, does all IADL as well..but does not drive anymore     Hand Dominance   Dominant Hand: Right    Extremity/Trunk Assessment   Upper Extremity Assessment Upper Extremity Assessment: Defer to OT evaluation    Lower Extremity Assessment Lower Extremity Assessment: Generalized weakness    Cervical / Trunk Assessment Cervical / Trunk Assessment: Kyphotic  Communication   Communication: No difficulties  Cognition Arousal/Alertness: Awake/alert Behavior During Therapy: WFL for tasks assessed/performed Overall Cognitive Status: Impaired/Different from baseline Area of Impairment: Orientation, Attention, Memory, Following commands, Safety/judgement, Problem solving                 Orientation Level: Disoriented to, Situation, Time Current Attention Level: Sustained Memory: Decreased short-term memory Following Commands: Follows one step commands consistently, Follows one step commands with increased time Safety/Judgement: Decreased awareness of safety, Decreased awareness of deficits   Problem Solving: Slow processing, Decreased initiation, Difficulty sequencing, Requires verbal cues General Comments: Pt very pleasant and cooperative        General Comments General comments (skin integrity, edema, etc.): HR in the 70-80's, SpO2 WFL >90% on RA, BP 84/52in sitting, 97/56 in sitting after 3 min. then 106/56 supine at end of session    Exercises     Assessment/Plan    PT Assessment Patient needs continued PT services  PT Problem List Decreased strength;Decreased mobility;Decreased activity tolerance;Decreased balance;Decreased cognition;Decreased safety awareness;Decreased coordination;Decreased knowledge of use of DME       PT Treatment Interventions DME instruction;Gait training;Therapeutic activities;Therapeutic  exercise;Patient/family education;Balance training;Functional mobility training    PT Goals (Current goals can be found in the Care Plan section)  Acute Rehab PT Goals Patient Stated Goal: to get better PT Goal Formulation: With patient Time For Goal Achievement: 04/25/22 Potential to Achieve Goals: Good    Frequency Min 2X/week     Co-evaluation PT/OT/SLP Co-Evaluation/Treatment: Yes Reason for Co-Treatment: For patient/therapist safety PT goals addressed during session: Mobility/safety with mobility;Balance OT goals addressed during session: ADL's and self-care;Strengthening/ROM       AM-PAC PT "6 Clicks" Mobility  Outcome Measure Help needed turning from your back to your side while in a flat bed without using bedrails?: A Lot Help needed moving from lying on your back to sitting on the side of a flat bed without using bedrails?: A Lot Help needed moving to and from a bed to a chair (including a wheelchair)?: Total Help needed standing up from a chair using your arms (e.g., wheelchair or bedside chair)?: Total Help needed to walk in hospital room?: Total Help needed climbing 3-5 steps with a railing? : Total 6 Click Score: 8    End of Session   Activity Tolerance: Patient tolerated treatment well;Patient limited by fatigue Patient left: in bed;with call bell/phone within reach;with bed alarm set   PT Visit Diagnosis: Muscle weakness (generalized) (M62.81);Difficulty in walking, not elsewhere classified (R26.2)    Time: 4481-8563 PT Time Calculation (min) (ACUTE ONLY): 18 min   Charges:   PT Evaluation $PT  Eval Moderate Complexity: 1 Mod            Doreatha Massed, PT Acute Rehabilitation  Office: [phone] Pager: [phone]

## 2022-04-11 NOTE — Consult Note (Addendum)
NAME:  Gloria [name], MRN:  [id number], DOB:  01-28-1927, LOS: 2 ADMISSION DATE:  04/09/2022, CONSULTATION DATE: 10/2 REFERRING MD: Dr. Cyndia Skeeters, CHIEF COMPLAINT: Shock  History of Present Illness:  Gloria [name] is a 88 year old female with past medical history as below, which is significant for hypertension and hyperlipidemia.  She lives at home and is able to independently perform activities of daily living.  She is somewhat confused and not the most accurate historian, but she does describe diarrhea going back a couple of weeks and diminishing urine output over that same period.  She presented to [facility] long emergency department on 9/30 with complaints of diarrhea and weakness as well as abdominal pain.  In the emergency department she was also noted to be hypotensive with systolic blood pressures in the 70s which did respond to fluid resuscitation.  Laboratory evaluation significant for impaired renal function as well as markedly elevated troponin.  CT of the abdomen and pelvis demonstrated colitis as well as a 2.4 cm possible fluid collection concerning for ovarian cyst.  She was admitted to the hospitalist service.  Cardiology was consulted for markedly elevated troponin, however, they do not feel there is an primary cardiac ischemic cause and the abnormal lab was likely due to demand ischemia.  The patient underwent aggressive IV fluid resuscitation with a total of 5 L of crystalloid.  Echocardiogram with LVEF 70 to 75% and was concerning for mitral chordal SAM with turbulent LV outflow tract flow.  Blood pressures initially responded well to IV fluid, however, in the overnight hours of 10/1 to 10/2 the patient became hypotensive and was started on norepinephrine infusion.  PCCM was consulted for vasopressor management.  Pertinent  Medical History   has a past medical history of GERD (04/10/2007), HYPERLIPIDEMIA (04/13/2007), HYPERTENSION (04/10/2007), Impaired glucose tolerance (09/17/2011),  OSTEOPOROSIS (04/10/2007), and Unspecified vitamin D deficiency (08/26/2009).   Significant Hospital Events: Including procedures, antibiotic start and stop dates in addition to other pertinent events   Presented for abdominal pain, diarrhea, weakness 9/30 10/2 started on norepinephrine for low blood pressures and transferred to ICU.  Interim History / Subjective:  No complaints currently  Objective   Blood pressure (!) 115/50, pulse 71, temperature (!) 97.5 F (36.4 C), temperature source Oral, resp. rate (!) 22, height 5\' 3"  (1.6 m), weight 52.3 kg, SpO2 98 %.        Intake/Output Summary (Last 24 hours) at 04/11/2022 1202 Last data filed at 04/11/2022 [id number] Gross per 24 hour  Intake 2336.49 ml  Output 100 ml  Net 2236.49 ml   Filed Weights   04/09/22 1129 04/10/22 0500 04/11/22 0500  Weight: 53.1 kg 59.4 kg 52.3 kg    Examination: General: Thin elderly female in no acute distress HENT: Normocephalic, atraumatic, mucous membranes dry Lungs: Clear bilateral breath sounds Cardiovascular: Regular rate and rhythm, systolic murmur Abdomen: Soft, nontender, nondistended.  Hypoactive Extremities: No acute deformity or range of motion limitation.  No edema Neuro: Alert, oriented, nonfocal.  Does have some confusion regarding the events preceding her presentation.  Resolved Hospital Problem list     Assessment & Plan:   Shock: Hypovolemic shock plus or minus septic shock.  Source would presumably be colitis based on CT, which also questions pneumonia, however, clinically her symptoms are much more in line with colitis. Lactic acid has cleared. Procalcitonin 1.7. - Continue empiric antibiotics per primary - Don't think she is fully volume resuscitated yet despite being 5L positive. - Continue gentle IVF resuscitation -  NE titrated for MAP goal 65 mmHg, which can likely be weaned at this time, perhaps to off.   AKI: dehydration + rhabdomyolysis. Also on lisinopril, HCTZ at home.   - Continue IVF resuscitation and BP supportive measures - Continue to trend chemistries, CK - Hold home ACE-I and HCTZ.  - I&O  NSTEMI Dynamic LVOT obstruction - Cardiology following - Recommending volume - No ischemic workup necessary  Hyponatermia Hypophos - na phos repletion pending  Constipation:  - soap suds enema  Lung nodules - outpatient follow up if she wishes.   Best Practice (right click and "Reselect all SmartList Selections" daily)   Per primary  Labs   CBC: Recent Labs  Lab 04/09/22 1212 04/10/22 0309 04/11/22 0553  WBC 33.7* 29.9* 22.8*  NEUTROABS 29.0*  --   --   HGB 15.1* 12.1 11.1*  HCT 41.9 33.4* 31.5*  MCV 86.4 87.0 89.0  PLT 205 153 139*    Basic Metabolic Panel: Recent Labs  Lab 04/09/22 1212 04/09/22 1932 04/10/22 0309 04/11/22 0553  NA 135 133* 130* 128*  K <2.0* 2.7* 4.0 3.8  CL 86* 91* 94* 95*  CO2 31 26 25 23   GLUCOSE 162* 132* 137* 113*  BUN 109* 100* 93* 94*  CREATININE 4.46* 4.21* 4.06* 4.71*  CALCIUM 9.2 8.5* 8.0* 7.7*  MG 2.9*  --  2.0 1.8  PHOS  --  2.4* 1.3* 1.1*   GFR: Estimated Creatinine Clearance: 6.2 mL/min (A) (by C-G formula based on SCr of 4.71 mg/dL (H)). Recent Labs  Lab 04/09/22 1212 04/09/22 1400 04/09/22 1932 04/09/22 2250 04/10/22 0309 04/10/22 1158 04/10/22 1507 04/11/22 0553  PROCALCITON  --   --   --   --  1.74  --   --   --   WBC 33.7*  --   --   --  29.9*  --   --  22.8*  LATICACIDVEN  --    < > 3.5* 2.8*  --  2.4* 1.8  --    < > = values in this interval not displayed.    Liver Function Tests: Recent Labs  Lab 04/09/22 1212 04/09/22 1932 04/10/22 0309 04/11/22 0553  AST 128*  --  137* 94*  ALT 41  --  41 41  ALKPHOS 113  --  86 78  BILITOT 2.2*  --  1.9* 1.4*  PROT 6.9  --  5.1* 4.7*  ALBUMIN 3.6 2.9* 2.4* 2.2*   No results for input(s): "LIPASE", "AMYLASE" in the last 168 hours. No results for input(s): "AMMONIA" in the last 168 hours.  ABG No results found for:  "PHART", "PCO2ART", "PO2ART", "HCO3", "TCO2", "ACIDBASEDEF", "O2SAT"   Coagulation Profile: Recent Labs  Lab 04/09/22 1212  INR 1.0    Cardiac Enzymes: Recent Labs  Lab 04/09/22 1602 04/10/22 0309 04/11/22 0553  CKTOTAL 5,012* 612* 1,464*    HbA1C: Hgb A1c MFr Bld  Date/Time Value Ref Range Status  11/16/2021 09:03 AM 5.5 4.6 - 6.5 % Final    Comment:    Glycemic Control Guidelines for People with Diabetes:Non Diabetic:  <6%Goal of Therapy: <7%Additional Action Suggested:  >8%   11/11/2020 08:47 AM 5.6 4.6 - 6.5 % Final    Comment:    Glycemic Control Guidelines for People with Diabetes:Non Diabetic:  <6%Goal of Therapy: <7%Additional Action Suggested:  >8%     CBG: No results for input(s): "GLUCAP" in the last 168 hours.  Review of Systems:   Bolds are positive  Constitutional: weight  loss, gain, night sweats, Fevers, chills, fatigue .  HEENT: headaches, Sore throat, sneezing, nasal congestion, post nasal drip, Difficulty swallowing, Tooth/dental problems, visual complaints visual changes, ear ache CV:  chest pain, radiates:,Orthopnea, PND, swelling in lower extremities, dizziness, palpitations, syncope.  GI  heartburn, indigestion, abdominal pain, nausea, vomiting, diarrhea, change in bowel habits, loss of appetite, bloody stools.  Resp: cough, productive: , hemoptysis, dyspnea, chest pain, pleuritic.  Skin: rash or itching or icterus GU: dysuria, change in color of urine, urgency or frequency. flank pain, hematuria  MS: joint pain or swelling. decreased range of motion  Psych: change in mood or affect. depression or anxiety.  Neuro: difficulty with speech, weakness, numbness, ataxia   Past Medical History:  She,  has a past medical history of GERD (04/10/2007), HYPERLIPIDEMIA (04/13/2007), HYPERTENSION (04/10/2007), Impaired glucose tolerance (09/17/2011), OSTEOPOROSIS (04/10/2007), and Unspecified vitamin D deficiency (08/26/2009).   Surgical History:   Past Surgical  History:  Procedure Laterality Date   CATARACT EXTRACTION       Social History:   reports that she has never smoked. She has never used smokeless tobacco. She reports that she does not drink alcohol and does not use drugs.   Family History:  Her family history includes Heart disease in her father; Stroke in her sister.   Allergies No Known Allergies   Home Medications  Prior to Admission medications   Medication Sig Start Date End Date Taking? Authorizing Provider  Cholecalciferol (THERA-D 2000) 50 MCG (2000 UT) TABS 1 tab by mouth once daily 11/11/20  Yes Biagio Borg, MD  lisinopril-hydrochlorothiazide (ZESTORETIC) 20-12.5 MG tablet Take 1 tablet by mouth daily. 11/16/21  Yes Biagio Borg, MD  rosuvastatin (CRESTOR) 40 MG tablet TAKE ONE TABLET BY MOUTH DAILY Patient taking differently: Take 40 mg by mouth daily. 11/16/21  Yes Biagio Borg, MD     Critical care time: 32 minutes       Georgann Housekeeper, AGACNP-BC [place] for personal pager PCCM on call pager [phone] until 7pm. Please call Elink 7p-7a. [phone]  04/11/2022 12:02 PM

## 2022-04-11 NOTE — Evaluation (Signed)
Occupational Therapy Evaluation Patient Details Name: Gloria [name] MRN: [id number] DOB: [birth year] Today's Date: 04/11/2022   History of Present Illness 88 year old F admitted 04/09/22 with weakness and diarrhea for 4 to 5 days. Dx with non-STEMI, hypotension, severe dehydration, AKI. PMH includes HTN, AAA, heart murmur, GERD and osteoporosis   Clinical Impression   Pt unreliable historian, reports typically independent in ADL and mobility (without DME), does her own IADL as well but does not drive. Her nephew that lives beside her assists her with groceries. Today she presents with deficits in cognition, generalized weakness, decreased activity tolerance, decreased strength, and decreased balance. She is min to mod A for UB ADL and Max A for LB ADL. She was mod A +2 for all aspects of bed mobility requiring multimodal cues. BP soft throughout, and Pt required min to mod A for seated balance EOB. Did not attempt to stand for safety reasons, returned supine. Pt very pleasant and cooperative throughout. At this time recommending SNF post-acute in addition to continued skilled OT in the acute setting to maximize safety and independence in ADL and functional transfers.      Recommendations for follow up therapy are one component of a multi-disciplinary discharge planning process, led by the attending physician.  Recommendations may be updated based on patient status, additional functional criteria and insurance authorization.   Follow Up Recommendations  Skilled nursing-short term rehab (<3 hours/day)    Assistance Recommended at Discharge Frequent or constant Supervision/Assistance  Patient can return home with the following Two people to help with walking and/or transfers;A lot of help with bathing/dressing/bathroom;Assistance with cooking/housework;Direct supervision/assist for medications management;Direct supervision/assist for financial management;Assist for transportation;Help with stairs or  ramp for entrance    Functional Status Assessment  Patient has had a recent decline in their functional status and demonstrates the ability to make significant improvements in function in a reasonable and predictable amount of time.  Equipment Recommendations  BSC/3in1;Wheelchair (measurements OT);Wheelchair cushion (measurements OT) (defer to next venue of care)    Recommendations for Other Services PT consult;Speech consult     Precautions / Restrictions Precautions Precautions: Fall Precaution Comments: watch BP Restrictions Weight Bearing Restrictions: No      Mobility Bed Mobility Overal bed mobility: Needs Assistance Bed Mobility: Rolling, Supine to Sit, Sit to Supine Rolling: Mod assist, +2 for physical assistance, +2 for safety/equipment (verbal cues for sequencing)   Supine to sit: Mod assist, +2 for safety/equipment, +2 for physical assistance, HOB elevated Sit to supine: Max assist, +2 for physical assistance, +2 for safety/equipment   General bed mobility comments: multimodal cues for sequencing, physical assist necessary    Transfers                   General transfer comment: did not attempt due for safety this session, falling asleep while sitting EOB, low BP      Balance Overall balance assessment: Needs assistance Sitting-balance support: Bilateral upper extremity supported Sitting balance-Leahy Scale: Poor Sitting balance - Comments: min to mod A for EOB, can engage if given a "target time" to maintain her own balance - then fatigues and requires A to prevent LOB                                   ADL either performed or assessed with clinical judgement   ADL Overall ADL's : Needs assistance/impaired Eating/Feeding: Set up Eating/Feeding Details (indicate cue type  and reason): had eaten pancake breakfast before therapy entered Grooming: Wash/dry hands;Wash/dry face;Set up;Bed level Grooming Details (indicate cue type and  reason): HOB elevated Upper Body Bathing: Moderate assistance   Lower Body Bathing: Maximal assistance   Upper Body Dressing : Minimal assistance;Sitting Upper Body Dressing Details (indicate cue type and reason): extra gown like robe Lower Body Dressing: Maximal assistance;Bed level Lower Body Dressing Details (indicate cue type and reason): socks     Toileting- Clothing Manipulation and Hygiene: Moderate assistance;+2 for physical assistance;+2 for safety/equipment;Bed level Toileting - Clothing Manipulation Details (indicate cue type and reason): rolling for cleaning and bed pad change     Functional mobility during ADLs: Moderate assistance;+2 for physical assistance;+2 for safety/equipment       Vision Patient Visual Report: No change from baseline       Perception     Praxis      Pertinent Vitals/Pain Pain Assessment Pain Assessment: No/denies pain     Hand Dominance Right   Extremity/Trunk Assessment Upper Extremity Assessment Upper Extremity Assessment: Generalized weakness (BUE edema)   Lower Extremity Assessment Lower Extremity Assessment: Defer to PT evaluation   Cervical / Trunk Assessment Cervical / Trunk Assessment: Normal (rounded shoulders forward head)   Communication Communication Communication: No difficulties   Cognition Arousal/Alertness: Awake/alert Behavior During Therapy: Flat affect Overall Cognitive Status: Impaired/Different from baseline Area of Impairment: Orientation, Attention, Memory, Following commands, Safety/judgement, Problem solving                 Orientation Level: Disoriented to, Situation Current Attention Level: Sustained Memory: Decreased short-term memory Following Commands: Follows one step commands consistently, Follows one step commands with increased time Safety/Judgement: Decreased awareness of safety, Decreased awareness of deficits   Problem Solving: Slow processing, Decreased initiation, Difficulty  sequencing, Requires verbal cues General Comments: Pt very pleasant and cooperative, decreased initiation, increased processing time, unable to remember what she did for employment,     General Comments  HR in the 70-80's, SpO2 WFL >90% on RA, BP 84/52in sitting, 97/56 in sitting after 3 min. then 106/56 supine at end of session    Exercises Exercises: Other exercises Other Exercises Other Exercises: general leg exerecises sitting EOB   Shoulder Instructions      Home Living Family/patient expects to be discharged to:: Private residence Living Arrangements: Alone Available Help at Discharge: Family;Available 24 hours/day (nephew lives next door) Type of Home: House Home Access: Level entry     Home Layout: One level     Bathroom Shower/Tub: Occupational psychologist: Standard     Home Equipment: None   Additional Comments: nephew does grocery shopping, she does not drive      Prior Functioning/Environment Prior Level of Function : Independent/Modified Independent;Patient poor historian/Family not available             Mobility Comments: reports no DME ADLs Comments: independent, does all IADL as well..but does not drive anymore        OT Problem List: Decreased strength;Decreased activity tolerance;Impaired balance (sitting and/or standing);Decreased cognition;Decreased safety awareness;Decreased knowledge of use of DME or AE;Cardiopulmonary status limiting activity;Increased edema      OT Treatment/Interventions: Self-care/ADL training;Therapeutic exercise;Energy conservation;DME and/or AE instruction;Therapeutic activities;Patient/family education;Balance training;Cognitive remediation/compensation    OT Goals(Current goals can be found in the care plan section) Acute Rehab OT Goals Patient Stated Goal: get stronger again OT Goal Formulation: With patient Time For Goal Achievement: 04/25/22 Potential to Achieve Goals: Good ADL Goals Pt Will Perform  Grooming: with supervision;sitting Pt Will Perform Upper Body Dressing: with set-up;sitting Pt Will Perform Lower Body Dressing: with min assist;sit to/from stand Pt Will Transfer to Toilet: with min assist;stand pivot transfer;bedside commode Pt Will Perform Toileting - Clothing Manipulation and hygiene: with min assist;sit to/from stand Additional ADL Goal #1: Pt will maintain sitting balance EOB for 5 min at supervision level in preparation for participation in ADL  OT Frequency: Min 2X/week    Co-evaluation PT/OT/SLP Co-Evaluation/Treatment: Yes Reason for Co-Treatment: Necessary to address cognition/behavior during functional activity;To address functional/ADL transfers;For patient/therapist safety PT goals addressed during session: Balance;Mobility/safety with mobility;Strengthening/ROM OT goals addressed during session: ADL's and self-care;Strengthening/ROM      AM-PAC OT "6 Clicks" Daily Activity     Outcome Measure Help from another person eating meals?: A Little Help from another person taking care of personal grooming?: A Little Help from another person toileting, which includes using toliet, bedpan, or urinal?: A Lot Help from another person bathing (including washing, rinsing, drying)?: A Lot Help from another person to put on and taking off regular upper body clothing?: A Lot Help from another person to put on and taking off regular lower body clothing?: A Lot 6 Click Score: 14   End of Session Nurse Communication: Mobility status;Precautions;Other (comment) (low BP)  Activity Tolerance: Patient tolerated treatment well Patient left: in bed;with call bell/phone within reach;with chair alarm set  OT Visit Diagnosis: Unsteadiness on feet (R26.81);Other abnormalities of gait and mobility (R26.89);Muscle weakness (generalized) (M62.81);Other symptoms and signs involving cognitive function                Time: 8841-6606 OT Time Calculation (min): 19 min Charges:  OT General  Charges $OT Visit: 1 Visit OT Evaluation $OT Eval Moderate Complexity: 1 Mod  Nyoka Cowden OTR/L Acute Rehabilitation Services Office: [phone]   Evern Bio [facility] 04/11/2022, 11:13 AM

## 2022-04-12 DIAGNOSIS — R34 Anuria and oliguria: Secondary | ICD-10-CM | POA: Diagnosis not present

## 2022-04-12 DIAGNOSIS — I7121 Aneurysm of the ascending aorta, without rupture: Secondary | ICD-10-CM | POA: Diagnosis not present

## 2022-04-12 DIAGNOSIS — N179 Acute kidney failure, unspecified: Secondary | ICD-10-CM | POA: Diagnosis not present

## 2022-04-12 DIAGNOSIS — R7989 Other specified abnormal findings of blood chemistry: Secondary | ICD-10-CM | POA: Diagnosis not present

## 2022-04-12 LAB — CBC
HCT: 30.2 % — ABNORMAL LOW (ref 36.0–46.0)
Hemoglobin: 10.5 g/dL — ABNORMAL LOW (ref 12.0–15.0)
MCH: 31 pg (ref 26.0–34.0)
MCHC: 34.8 g/dL (ref 30.0–36.0)
MCV: 89.1 fL (ref 80.0–100.0)
Platelets: 121 10*3/uL — ABNORMAL LOW (ref 150–400)
RBC: 3.39 MIL/uL — ABNORMAL LOW (ref 3.87–5.11)
RDW: 13.5 % (ref 11.5–15.5)
WBC: 17.1 10*3/uL — ABNORMAL HIGH (ref 4.0–10.5)
nRBC: 0 % (ref 0.0–0.2)

## 2022-04-12 LAB — COMPREHENSIVE METABOLIC PANEL
ALT: 46 U/L — ABNORMAL HIGH (ref 0–44)
AST: 93 U/L — ABNORMAL HIGH (ref 15–41)
Albumin: 2 g/dL — ABNORMAL LOW (ref 3.5–5.0)
Alkaline Phosphatase: 70 U/L (ref 38–126)
Anion gap: 7 (ref 5–15)
BUN: 84 mg/dL — ABNORMAL HIGH (ref 8–23)
CO2: 22 mmol/L (ref 22–32)
Calcium: 7.2 mg/dL — ABNORMAL LOW (ref 8.9–10.3)
Chloride: 101 mmol/L (ref 98–111)
Creatinine, Ser: 4.36 mg/dL — ABNORMAL HIGH (ref 0.44–1.00)
GFR, Estimated: 9 mL/min — ABNORMAL LOW (ref >60)
Glucose, Bld: 101 mg/dL — ABNORMAL HIGH (ref 70–99)
Potassium: 3.7 mmol/L (ref 3.5–5.1)
Sodium: 130 mmol/L — ABNORMAL LOW (ref 135–145)
Total Bilirubin: 0.9 mg/dL (ref 0.3–1.2)
Total Protein: 4.4 g/dL — ABNORMAL LOW (ref 6.5–8.1)

## 2022-04-12 LAB — CK: Total CK: 538 U/L — ABNORMAL HIGH (ref 38–234)

## 2022-04-12 LAB — MAGNESIUM: Magnesium: 1.6 mg/dL — ABNORMAL LOW (ref 1.7–2.4)

## 2022-04-12 LAB — PHOSPHORUS: Phosphorus: 1.8 mg/dL — ABNORMAL LOW (ref 2.5–4.6)

## 2022-04-12 MED ORDER — ENSURE ENLIVE PO LIQD
237.0000 mL | ORAL | Status: DC
  Administered 2022-04-12 – 2022-04-16 (×5): 237 mL via ORAL

## 2022-04-12 MED ORDER — SODIUM PHOSPHATES 45 MMOLE/15ML IV SOLN
30.0000 mmol | Freq: Once | INTRAVENOUS | Status: AC
  Administered 2022-04-12: 30 mmol via INTRAVENOUS
  Filled 2022-04-12: qty 10

## 2022-04-12 MED ORDER — BANATROL TF EN LIQD
60.0000 mL | Freq: Two times a day (BID) | ENTERAL | Status: DC
  Administered 2022-04-12 – 2022-04-17 (×10): 60 mL via ORAL
  Filled 2022-04-12 (×12): qty 60

## 2022-04-12 MED ORDER — SODIUM CHLORIDE 0.9 % IV SOLN
3.0000 g | INTRAVENOUS | Status: AC
  Administered 2022-04-12 – 2022-04-15 (×4): 3 g via INTRAVENOUS
  Filled 2022-04-12 (×4): qty 8

## 2022-04-12 MED ORDER — MAGNESIUM SULFATE 2 GM/50ML IV SOLN
2.0000 g | Freq: Once | INTRAVENOUS | Status: AC
  Administered 2022-04-12: 2 g via INTRAVENOUS
  Filled 2022-04-12: qty 50

## 2022-04-12 NOTE — Progress Notes (Signed)
Report called to Karen Kitchens RN. All questions answered at this time. All pt belongings and paper chart transported with pt. Pt was transferred upstairs in the bed on tele. Handoff completed.

## 2022-04-12 NOTE — Progress Notes (Signed)
Initial Nutrition Assessment  DOCUMENTATION CODES:   Not applicable  INTERVENTION:  -Provide Ensure Enlive/Ensure Plus HP q day (350kcal, 20g protein) -Provide Banatrol BID for diarrhea  -Discontinue dulcolax and miralax order- pt c/o frequent diarrhea that has not improved  NUTRITION DIAGNOSIS:  Increased nutrient needs related to other (see comment) (DTI) as evidenced by  (increased metabolic need for healing).  GOAL:  Patient will meet greater than or equal to 90% of their needs  MONITOR:  PO intake, Supplement acceptance  REASON FOR ASSESSMENT:  Consult Poor PO  ASSESSMENT:  Pt is a 88yo F with PMH of HTN, HLD, osteoporosis, GERD and vitamin D deficiency who presents with weakness and diarrhea. Family at bedside reports pt was very active prior to getting sick. She has her own garden and grew tomatoes this summer. She also takes walks around the block without difficulty.  Pt reports a good appetite, denies recent weight loss. Intake this AM documented at 40% of meal. Reports usual body weight of 120#, current weight consistent. She reports using Ensure at home sometimes, and eats regular meals. Noted DTI and irritant dermatitis. Pt reports weakness PTA due to diarrhea and likely dehydration. Has mild muscle wasting but likely age related. She is agreeable to Ensure Plus HP q day to provide 350kcal and 20g protein/bottle. Monitor tolerance with diarrhea. Offered Banatrol BID to improve stool consistency and pt agreeable to try it. Pt has dulcolax and miralax ordered but it is being held, consider discontinuing order. C diff negative on admission.  Medications reviewed and include: dulcolax (on hold), miralax (on hold)  Labs reviewed: Na:130, K:3.7, BG:101, BUN:84, Cr:4.36, Phos:1.8, Mg:1.6, AST: 93, ALT:46, GFR:9   NUTRITION - FOCUSED PHYSICAL EXAM:  Flowsheet Row Most Recent Value  Orbital Region No depletion  Upper Arm Region Mild depletion  Thoracic and Lumbar Region No  depletion  Buccal Region No depletion  Temple Region Mild depletion  Clavicle Bone Region Mild depletion  Clavicle and Acromion Bone Region Mild depletion  Scapular Bone Region Unable to assess  Dorsal Hand No depletion  Patellar Region No depletion  Anterior Thigh Region No depletion  Posterior Calf Region No depletion  Hair Reviewed  Eyes Reviewed  Mouth Reviewed  Skin Reviewed  Nails Reviewed       Diet Order:   Diet Order             Diet regular Room service appropriate? Yes; Fluid consistency: Thin  Diet effective now                   EDUCATION NEEDS:  Education needs have been addressed  Skin:  Skin Assessment: Skin Integrity Issues: Skin Integrity Issues:: DTI DTI: DTI and irritant dermatitis on buttock  Last BM:  04/12/22  Height:  Ht Readings from Last 1 Encounters:  04/09/22 5\' 3"  (1.6 m)   Weight:  Wt Readings from Last 1 Encounters:  04/12/22 54.9 kg    BMI:  Body mass index is 21.44 kg/m.  Estimated Nutritional Needs:   Kcal:  1400-1650kcal  Protein:  65-85g  Fluid:  1400-166mL  Candise Bowens, MS, RD, LDN, CNSC See AMiON for contact information

## 2022-04-12 NOTE — Progress Notes (Signed)
PROGRESS NOTE  Gloria [name] [id number] DOB: 13-Jul-1926   PCP: Biagio Borg, MD  Patient is from: Home.  Lives alone.  Very independent and active at baseline.  DOA: 04/09/2022 LOS: 3  Chief complaints Chief Complaint  Patient presents with   Hypotension   Diarrhea     Brief Narrative / Interim history: 88 year old F with PMH of HTN, AAA, heart murmur, GERD and osteoporosis brought to ED by EMS due to weakness and diarrhea for 4 to 5 days.  She was hypotensive to 70s when EMS arrived but improved to 80s after 500 cc bolus.  She had significant leukocytosis with lactic acidosis raising concern for infection.  She is also in severe AKI with azotemia.  CT abdomen and pelvis suggests enteritis, stercolitis, moderate to large stool burden, 2.4 cm fluid density in right pelvis, biliary sludge and lingula bronchiectasis with interstitial marking.  She also have markedly elevated troponin to 19,000 that has improved to 17,000.  She had no cardiopulmonary symptoms or EKG findings to suggest ACS.  CK was elevated to 6000 as well.  UA does not suggest UTI.  She was admitted with working diagnosis of non-STEMI, hypotension, severe dehydration, AKI.  She was started on rigorous IV fluid, broad-spectrum antibiotics and admitted.    Started on Levophed peripherally the night of admission due to hypotension.  Blood culture, C. difficile, GIP, MRSA PCR screen negative.  TTE without significant finding.  Renal function worse after interval improvement.  Nephrology and PCCM consulted.  Infectious work-up negative except for CT finding of possible enteritis/stercolitis.  She came off Levophed.  AKI started to improve, and she started to make urine.  Antibiotic de-escalated to IV Unasyn.  Cardiology and PCCM signed off.  Nephrology following.  Subjective: Seen and examined earlier this morning.  No major events overnight of this morning.  About 300 cc urine and urine in canister.  Some swelling from  extravasation in upper extremities.  No complaints.  Nephew at bedside  Objective: Vitals:   04/12/22 1100 04/12/22 1200 04/12/22 1300 04/12/22 1400  BP: 128/64 116/61 (!) 91/46 (!) 119/57  Pulse: 79 76 68 78  Resp: (!) 23 (!) 23 17 (!) 24  Temp:  (!) 97.3 F (36.3 C)    TempSrc:  Oral    SpO2: 97% 97% 98% 99%  Weight:      Height:        Examination:  GENERAL: No apparent distress.  Nontoxic. HEENT: MMM.  Vision and hearing grossly intact.  NECK: Supple.  No apparent JVD.  RESP:  No IWOB.  Fair aeration bilaterally. CVS:  RRR.  2/6 SEM over LUSB and LLSB.  Heart sounds normal.  ABD/GI/GU: BS+. Abd soft, NTND.  MSK/EXT:  Moves extremities.  Some swelling in LUE from extravasation.  Also ecchymosis SKIN: no apparent skin lesion or wound NEURO: Awake and alert. Oriented appropriately.  No apparent focal neuro deficit. PSYCH: Calm. Normal affect.   Procedures:  None  Microbiology summarized: MRSA PCR screen negative. C. difficile negative. Blood cultures NGTD GIP negative.  Assessment and plan: Principal Problem:   Elevated troponin Active Problems:   Essential hypertension   GERD   Heart murmur   Ascending aortic aneurysm (HCC)   AKI (acute kidney injury) (HCC)   Azotemia   Generalized weakness   Bandemia   Hypokalemia   Elevated AST (SGOT)   Diarrhea   Enteritis   Constipation   Stercoral colitis   Dehydration   Hypovolemic shock (HCC)  Lactic acidosis   Rhabdomyolysis   Anuria   NSTEMI (non-ST elevated myocardial infarction) (HCC)   Shock (HCC)  Markedly elevated troponin/possible non-STEMI: No cardiopulmonary symptoms or signs of ischemia on EKG. She is also severely dehydrated with severe AKI which might be contributing.  Also concern for infectious process but no clear source other than some enteritis.  TTE with LVEF of 70 to 75%, G1 DD and moderate AVS and mitral chordal S.A.M with turbulent LVOT flow.  -Cardiology signed off.   Anuric  AKI/azotemia: Likely prerenal in the setting of GI loss and concurrent use of ACE inhibitors and HCTZ.  No signs of obstruction on CT. CK 6000>>> 538.  Urine output picking up. Recent Labs    11/16/21 0903 04/09/22 1212 04/09/22 1932 04/10/22 0309 04/11/22 0553 04/12/22 0245  BUN 14 109* 100* 93* 94* 84*  CREATININE 0.75 4.46* 4.21* 4.06* 4.71* 4.36*  -Hold HCTZ and ACE inhibitor's -Appreciate nephrology input-continue IVF at 75 cc an hour -Strict intake and output -Bladder scan every 8 hours   Severe dehydration/hypovolemic shock: Improved.  Came off Levophed. -PCCM signed off. -Continue low-dose midodrine -IV fluid as above   Diarrhea/enteritis/possible stercolitis/constipation: C. difficile and GIP negative.  Abdominal exam benign. -Soapsuds enema -Continue p.o. MiraLAX and Dulcolax suppository -De-escalate antibiotic to IV Unasyn.  Leukocytosis/bandemia/lactic acidosis: Likely due to dehydration, AKI and intra-abdominal infection.  Diarrhea and CT findings suggest enteritis and stercolitis but abdominal exam benign.  No respiratory or UTI symptoms.  C. difficile and GIP negative.   -Antibiotic as above  Nontraumatic rhabdomyolysis/elevated AST: AST improved.  Improving. -IV fluid as above -Continue monitoring   Abnormal CT-some concern about lung nodules in RUL and 2.4 cm fluid density in pelvis suggesting ovarian cyst or an artifact. -Outpatient follow-up for RUL nodules -Follow-up pelvic sonogram in 3 months for pelvic cyst   Generalized weakness-in the setting of dehydration, diarrhea, AKI, azotemia... Patient is very independent and active at baseline -PT/OT eval   Heart murmur/moderate aortic valve stenosis/dynamic LVOT obstruction: TTE as above.   Goal of care discussion: Discussed CODE STATUS including pros and cons of CPR and intubation.  Patient prefers to be DNR.  Nephew, HCPOA in agreement.  Hypokalemia/hypophosphatemia/hyponatremia:  -Per  nephrology.  Increased nutrient needs Body mass index is 21.44 kg/m. -Consult dietitian   Pressure skin injury: POA Pressure Injury 04/09/22 Buttocks Right Deep Tissue Pressure Injury - Purple or maroon localized area of discolored intact skin or blood-filled blister due to damage of underlying soft tissue from pressure and/or shear. 6X2.5cm (Active)  04/09/22 1815  Location: Buttocks  Location Orientation: Right  Staging: Deep Tissue Pressure Injury - Purple or maroon localized area of discolored intact skin or blood-filled blister due to damage of underlying soft tissue from pressure and/or shear.  Wound Description (Comments): 6X2.5cm  Present on Admission: Yes  Dressing Type Foam - Lift dressing to assess site every shift 04/12/22 0800   DVT prophylaxis:  heparin injection 5,000 Units Start: 04/10/22 1415    Code Status: DNR/DNI Family Communication: Updated patient's nephew, H POA at bedside Level of care: Telemetry Status is: Inpatient Remains inpatient appropriate because: Severe dehydration, hypotension, AKI, enteritis/stercolitis   Final disposition: TBD Consultants:  Pulmonology-signed off Cardiology-signed off Nephrology  Sch Meds:  Scheduled Meds:  bisacodyl  10 mg Rectal Daily   Chlorhexidine Gluconate Cloth  6 each Topical Daily   feeding supplement  237 mL Oral Q24H   fiber supplement (BANATROL TF)  60 mL Oral BID  Gerhardt's butt cream   Topical BID   heparin injection (subcutaneous)  5,000 Units Subcutaneous Q8H   midodrine  5 mg Oral TID WC   polyethylene glycol  17 g Oral BID   Continuous Infusions:  sodium chloride Stopped (04/12/22 0746)   ampicillin-sulbactam (UNASYN) IV Stopped (04/12/22 1218)   sodium phosphate 30 mmol in dextrose 5 % 250 mL infusion 30 mmol (04/12/22 1239)   PRN Meds:.acetaminophen **OR** acetaminophen, lip balm, ondansetron **OR** ondansetron (ZOFRAN) IV, mouth rinse  Antimicrobials: Anti-infectives (From admission,  onward)    Start     Dose/Rate Route Frequency Ordered Stop   04/12/22 1200  Ampicillin-Sulbactam (UNASYN) 3 g in sodium chloride 0.9 % 100 mL IVPB        3 g 200 mL/hr over 30 Minutes Intravenous Every 24 hours 04/12/22 0844 04/16/22 1159   04/10/22 1500  ceFEPIme (MAXIPIME) 1 g in sodium chloride 0.9 % 100 mL IVPB  Status:  Discontinued        1 g 200 mL/hr over 30 Minutes Intravenous Every 24 hours 04/09/22 1711 04/12/22 0815   04/10/22 0200  metroNIDAZOLE (FLAGYL) IVPB 500 mg  Status:  Discontinued        500 mg 100 mL/hr over 60 Minutes Intravenous Every 12 hours 04/09/22 1702 04/12/22 0815   04/09/22 1710  vancomycin variable dose per unstable renal function (pharmacist dosing)  Status:  Discontinued         Does not apply See admin instructions 04/09/22 1710 04/11/22 0737   04/09/22 1415  ceFEPIme (MAXIPIME) 2 g in sodium chloride 0.9 % 100 mL IVPB        2 g 200 mL/hr over 30 Minutes Intravenous  Once 04/09/22 1408 04/09/22 1451   04/09/22 1415  metroNIDAZOLE (FLAGYL) IVPB 500 mg        500 mg 100 mL/hr over 60 Minutes Intravenous  Once 04/09/22 1408 04/09/22 1559   04/09/22 1415  vancomycin (VANCOCIN) IVPB 1000 mg/200 mL premix        1,000 mg 200 mL/hr over 60 Minutes Intravenous  Once 04/09/22 1408 04/09/22 1659        I have personally reviewed the following labs and images: CBC: Recent Labs  Lab 04/09/22 1212 04/10/22 0309 04/11/22 0553 04/12/22 0245  WBC 33.7* 29.9* 22.8* 17.1*  NEUTROABS 29.0*  --   --   --   HGB 15.1* 12.1 11.1* 10.5*  HCT 41.9 33.4* 31.5* 30.2*  MCV 86.4 87.0 89.0 89.1  PLT 205 153 139* 121*   BMP &GFR Recent Labs  Lab 04/09/22 1212 04/09/22 1932 04/10/22 0309 04/11/22 0553 04/12/22 0245  NA 135 133* 130* 128* 130*  K <2.0* 2.7* 4.0 3.8 3.7  CL 86* 91* 94* 95* 101  CO2 31 26 25 23 22   GLUCOSE 162* 132* 137* 113* 101*  BUN 109* 100* 93* 94* 84*  CREATININE 4.46* 4.21* 4.06* 4.71* 4.36*  CALCIUM 9.2 8.5* 8.0* 7.7* 7.2*  MG  2.9*  --  2.0 1.8 1.6*  PHOS  --  2.4* 1.3* 1.1* 1.8*   Estimated Creatinine Clearance: 6.7 mL/min (A) (by C-G formula based on SCr of 4.36 mg/dL (H)). Liver & Pancreas: Recent Labs  Lab 04/09/22 1212 04/09/22 1932 04/10/22 0309 04/11/22 0553 04/12/22 0245  AST 128*  --  137* 94* 93*  ALT 41  --  41 41 46*  ALKPHOS 113  --  86 78 70  BILITOT 2.2*  --  1.9* 1.4* 0.9  PROT 6.9  --  5.1* 4.7* 4.4*  ALBUMIN 3.6 2.9* 2.4* 2.2* 2.0*   No results for input(s): "LIPASE", "AMYLASE" in the last 168 hours. No results for input(s): "AMMONIA" in the last 168 hours. Diabetic: No results for input(s): "HGBA1C" in the last 72 hours. No results for input(s): "GLUCAP" in the last 168 hours. Cardiac Enzymes: Recent Labs  Lab 04/09/22 1602 04/10/22 0309 04/11/22 0553 04/12/22 0245  CKTOTAL 5,012* 612* 1,464* 538*   No results for input(s): "PROBNP" in the last 8760 hours. Coagulation Profile: Recent Labs  Lab 04/09/22 1212  INR 1.0   Thyroid Function Tests: No results for input(s): "TSH", "T4TOTAL", "FREET4", "T3FREE", "THYROIDAB" in the last 72 hours. Lipid Profile: No results for input(s): "CHOL", "HDL", "LDLCALC", "TRIG", "CHOLHDL", "LDLDIRECT" in the last 72 hours. Anemia Panel: No results for input(s): "VITAMINB12", "FOLATE", "FERRITIN", "TIBC", "IRON", "RETICCTPCT" in the last 72 hours. Urine analysis:    Component Value Date/Time   COLORURINE AMBER (A) 04/09/2022 1140   APPEARANCEUR CLOUDY (A) 04/09/2022 1140   LABSPEC 1.016 04/09/2022 1140   PHURINE 5.0 04/09/2022 1140   GLUCOSEU 50 (A) 04/09/2022 1140   GLUCOSEU NEGATIVE 11/16/2021 0903   HGBUR MODERATE (A) 04/09/2022 1140   BILIRUBINUR NEGATIVE 04/09/2022 1140   KETONESUR NEGATIVE 04/09/2022 1140   PROTEINUR 100 (A) 04/09/2022 1140   UROBILINOGEN 0.2 11/16/2021 0903   NITRITE NEGATIVE 04/09/2022 1140   LEUKOCYTESUR SMALL (A) 04/09/2022 1140   Sepsis Labs: Invalid input(s): "PROCALCITONIN",  "LACTICIDVEN"  Microbiology: Recent Results (from the past 240 hour(s))  Blood Culture (routine x 2)     Status: None (Preliminary result)   Collection Time: 04/09/22 11:55 AM   Specimen: BLOOD  Result Value Ref Range Status   Specimen Description   Final    BLOOD RIGHT ANTECUBITAL Performed at [facility] [address], [place], [place] [address]    Special Requests   Final    BOTTLES DRAWN AEROBIC AND ANAEROBIC Blood Culture adequate volume Performed at [facility] [address] [place], [place] [address]    Culture   Final    NO GROWTH 3 DAYS Performed at [facility], [place] [address], [place], [place] [address]    Report Status PENDING  Incomplete  Blood Culture (routine x 2)     Status: None (Preliminary result)   Collection Time: 04/09/22 12:00 PM   Specimen: BLOOD  Result Value Ref Range Status   Specimen Description   Final    BLOOD BLOOD LEFT HAND Performed at [facility] [address], [place], [place] [address]    Special Requests   Final    BOTTLES DRAWN AEROBIC ONLY Blood Culture results may not be optimal due to an inadequate volume of blood received in culture bottles Performed at [facility] [address], [place], [place] [address]    Culture   Final    NO GROWTH 3 DAYS Performed at [facility], [place] [address], [place], [place] [address]    Report Status PENDING  Incomplete  C Difficile Quick Screen w PCR reflex     Status: None   Collection Time: 04/09/22  6:07 PM   Specimen: STOOL  Result Value Ref Range Status   C Diff antigen NEGATIVE NEGATIVE Final   C Diff toxin NEGATIVE NEGATIVE Final   C Diff interpretation No C. difficile detected.  Final    Comment: Performed at [facility], [place] [address] [place],  [address]  Gastrointestinal Panel by PCR , Stool     Status: None   Collection Time: 04/09/22  6:07 PM   Specimen:  STOOL  Result Value Ref Range Status   Campylobacter species NOT DETECTED NOT DETECTED Final   Plesimonas shigelloides NOT DETECTED NOT DETECTED Final   Salmonella species NOT DETECTED NOT DETECTED Final   Yersinia enterocolitica NOT DETECTED NOT DETECTED Final   Vibrio species NOT DETECTED NOT DETECTED Final   Vibrio cholerae NOT DETECTED NOT DETECTED Final   Enteroaggregative E coli (EAEC) NOT DETECTED NOT DETECTED Final   Enteropathogenic E coli (EPEC) NOT DETECTED NOT DETECTED Final   Enterotoxigenic E coli (ETEC) NOT DETECTED NOT DETECTED Final   Shiga like toxin producing E coli (STEC) NOT DETECTED NOT DETECTED Final   Shigella/Enteroinvasive E coli (EIEC) NOT DETECTED NOT DETECTED Final   Cryptosporidium NOT DETECTED NOT DETECTED Final   Cyclospora cayetanensis NOT DETECTED NOT DETECTED Final   Entamoeba histolytica NOT DETECTED NOT DETECTED Final   Giardia lamblia NOT DETECTED NOT DETECTED Final   Adenovirus F40/41 NOT DETECTED NOT DETECTED Final   Astrovirus NOT DETECTED NOT DETECTED Final   Norovirus GI/GII NOT DETECTED NOT DETECTED Final   Rotavirus A NOT DETECTED NOT DETECTED Final   Sapovirus (I, II, IV, and V) NOT DETECTED NOT DETECTED Final    Comment: Performed at [facility], [address] [place], [place] [address]  MRSA Next Gen by PCR, Nasal     Status: None   Collection Time: 04/09/22  6:28 PM   Specimen: Nasal Mucosa; Nasal Swab  Result Value Ref Range Status   MRSA by PCR Next Gen NOT DETECTED NOT DETECTED Final    Comment: (NOTE) The GeneXpert MRSA Assay (FDA approved for NASAL specimens only), is one component of a comprehensive MRSA colonization surveillance program. It is not intended to diagnose MRSA infection nor to guide or monitor treatment for MRSA infections. Test performance is not FDA approved in patients less than 96 years old. Performed at [facility], [place] [address], [place], [place] [address]     Radiology  Studies: No results found.    Masiya Claassen T. Markham  If 7PM-7AM, please contact night-coverage [email or web address] 04/12/2022, 2:36 PM

## 2022-04-12 NOTE — Progress Notes (Signed)
Rounding Note    Patient Name: Gloria [name] Date of Encounter: 04/12/2022  [facility] Cardiologist: Chrystie Nose, MD   Subjective   Feeling well. No chest pain, sob or palpitations.    Inpatient Medications    Scheduled Meds:  bisacodyl  10 mg Rectal Daily   Chlorhexidine Gluconate Cloth  6 each Topical Daily   Gerhardt's butt cream   Topical BID   heparin injection (subcutaneous)  5,000 Units Subcutaneous Q8H   midodrine  5 mg Oral TID WC   polyethylene glycol  17 g Oral BID   Continuous Infusions:  sodium chloride 10 mL/hr at 04/12/22 0707   ampicillin-sulbactam (UNASYN) IV     lactated ringers 75 mL/hr at 04/12/22 0707   PRN Meds: acetaminophen **OR** acetaminophen, lip balm, ondansetron **OR** ondansetron (ZOFRAN) IV, mouth rinse   Vital Signs    Vitals:   04/12/22 0300 04/12/22 0400 04/12/22 0500 04/12/22 0700  BP: (!) 114/54 (!) 108/53  (!) 112/55  Pulse: 74 72  73  Resp: 20 20  20   Temp:  97.6 F (36.4 C)    TempSrc:  Oral    SpO2: 98% 98%  98%  Weight:   54.9 kg   Height:        Intake/Output Summary (Last 24 hours) at 04/12/2022 0856 Last data filed at 04/12/2022 0751 Gross per 24 hour  Intake 3459.62 ml  Output 600 ml  Net 2859.62 ml       04/12/2022    5:00 AM 04/11/2022    5:00 AM 04/10/2022    5:00 AM  Last 3 Weights  Weight (lbs) 121 lb 0.5 oz 115 lb 4.8 oz 130 lb 15.3 oz  Weight (kg) 54.9 kg 52.3 kg 59.4 kg      Telemetry    NSR, PACs - Personally Reviewed  ECG    N/a  Physical Exam   Physical Exam: Blood pressure (!) 112/55, pulse 73, temperature 97.6 F (36.4 C), temperature source Oral, resp. rate 20, height 5\' 3"  (1.6 m), weight 54.9 kg, SpO2 98 %.       GEN: elderly, frail female,  NAD  HEENT: Normal NECK: No JVD; No carotid bruits LYMPHATICS: No lymphadenopathy CARDIAC: RR , 2-3/6 harsh systolic murmur  RESPIRATORY:  Clear to auscultation without rales, wheezing or rhonchi  ABDOMEN: Soft,  non-tender, non-distended MUSCULOSKELETAL:  No edema; No deformity  SKIN: Warm and dry NEUROLOGIC:  Alert and oriented x 3  Labs    High Sensitivity Troponin:   Recent Labs  Lab 04/09/22 1212 04/09/22 1413  TROPONINIHS 18,829* 16,633*      Chemistry Recent Labs  Lab 04/10/22 0309 04/11/22 0553 04/12/22 0245  NA 130* 128* 130*  K 4.0 3.8 3.7  CL 94* 95* 101  CO2 25 23 22   GLUCOSE 137* 113* 101*  BUN 93* 94* 84*  CREATININE 4.06* 4.71* 4.36*  CALCIUM 8.0* 7.7* 7.2*  MG 2.0 1.8 1.6*  PROT 5.1* 4.7* 4.4*  ALBUMIN 2.4* 2.2* 2.0*  AST 137* 94* 93*  ALT 41 41 46*  ALKPHOS 86 78 70  BILITOT 1.9* 1.4* 0.9  GFRNONAA 10* 8* 9*  ANIONGAP 11 10 7      Hematology Recent Labs  Lab 04/10/22 0309 04/11/22 0553 04/12/22 0245  WBC 29.9* 22.8* 17.1*  RBC 3.84* 3.54* 3.39*  HGB 12.1 11.1* 10.5*  HCT 33.4* 31.5* 30.2*  MCV 87.0 89.0 89.1  MCH 31.5 31.4 31.0  MCHC 36.2* 35.2 34.8  RDW 13.0  13.3 13.5  PLT 153 139* 121*    Radiology    ECHOCARDIOGRAM COMPLETE  Result Date: 04/10/2022    ECHOCARDIOGRAM REPORT   Patient Name:   Gloria [name] Date of Exam: 04/10/2022 Medical Rec #:  [id number]         Height:       63.0 in Accession #:    [id number]        Weight:       131.0 lb Date of Birth:  1927-03-03         BSA:          1.615 m Patient Age:    88 years          BP:           80/47 mmHg Patient Gender: F                 HR:           82 bpm. Exam Location:  Inpatient Procedure: 2D Echo, Cardiac Doppler and Color Doppler Indications:    Other cardiac sounds  History:        Patient has no prior history of Echocardiogram examinations.                 Risk Factors:Hypertension and Dyslipidemia.  Sonographer:    Jefferey Pica Referring Phys: [id number] TAYE T GONFA IMPRESSIONS  1. Left ventricular ejection fraction, by estimation, is 70 to 75%. The left ventricle has hyperdynamic function. The left ventricle has no regional wall motion abnormalities. There is severe concentric  left ventricular hypertrophy. Left ventricular diastolic parameters are consistent with Grade I diastolic dysfunction (impaired relaxation). Mitral chordal SAM noted with turbulent LVOT flow, not clearly assessed for gradient.  2. Right ventricular systolic function is normal. The right ventricular size is normal. There is normal pulmonary artery systolic pressure. The estimated right ventricular systolic pressure is 67.8 mmHg.  3. The mitral valve is abnormal. Mild mitral valve regurgitation.  4. The aortic valve is tricuspid. There is severe calcifcation of the aortic valve. Aortic valve regurgitation is not visualized. Moderate aortic valve stenosis, low gradient. Aortic valve area, by VTI measures 1.33 cm. Aortic valve mean gradient measures 14.5 mmHg. Aortic valve Vmax measures 2.68 m/s.  5. The inferior vena cava is normal in size with greater than 50% respiratory variability, suggesting right atrial pressure of 3 mmHg. Comparison(s): No prior Echocardiogram. FINDINGS  Left Ventricle: Left ventricular ejection fraction, by estimation, is 70 to 75%. The left ventricle has hyperdynamic function. The left ventricle has no regional wall motion abnormalities. The left ventricular internal cavity size was small. There is severe concentric left ventricular hypertrophy. Left ventricular diastolic parameters are consistent with Grade I diastolic dysfunction (impaired relaxation). Right Ventricle: The right ventricular size is normal. No increase in right ventricular wall thickness. Right ventricular systolic function is normal. There is normal pulmonary artery systolic pressure. The tricuspid regurgitant velocity is 2.84 m/s, and  with an assumed right atrial pressure of 3 mmHg, the estimated right ventricular systolic pressure is 93.8 mmHg. Left Atrium: Left atrial size was normal in size. Right Atrium: Right atrial size was normal in size. Pericardium: There is no evidence of pericardial effusion. Mitral Valve: The  mitral valve is abnormal. There is mild thickening of the mitral valve leaflet(s). There is mild calcification of the mitral valve leaflet(s). Mild mitral annular calcification. Mild mitral valve regurgitation. Tricuspid Valve: The tricuspid valve is grossly normal. Tricuspid valve regurgitation is mild.  Aortic Valve: The aortic valve is tricuspid. There is severe calcifcation of the aortic valve. There is mild aortic valve annular calcification. Aortic valve regurgitation is not visualized. Moderate aortic stenosis is present. Aortic valve mean gradient  measures 14.5 mmHg. Aortic valve peak gradient measures 28.7 mmHg. Aortic valve area, by VTI measures 1.33 cm. Pulmonic Valve: The pulmonic valve was grossly normal. Pulmonic valve regurgitation is mild. Aorta: The aortic root is normal in size and structure. Venous: The inferior vena cava is normal in size with greater than 50% respiratory variability, suggesting right atrial pressure of 3 mmHg. IAS/Shunts: There is right bowing of the interatrial septum, suggestive of elevated left atrial pressure. No atrial level shunt detected by color flow Doppler.  LEFT VENTRICLE PLAX 2D LVIDd:         2.80 cm   Diastology LVIDs:         1.40 cm   LV e' medial:    3.21 cm/s LV PW:         1.50 cm   LV E/e' medial:  16.0 LV IVS:        1.60 cm   LV e' lateral:   5.57 cm/s LVOT diam:     1.70 cm   LV E/e' lateral: 9.2 LV SV:         57 LV SV Index:   35 LVOT Area:     2.27 cm  RIGHT VENTRICLE             IVC RV Basal diam:  3.00 cm     IVC diam: 1.40 cm RV S prime:     16.00 cm/s TAPSE (M-mode): 3.0 cm LEFT ATRIUM             Index        RIGHT ATRIUM           Index LA diam:        3.50 cm 2.17 cm/m   RA Area:     15.50 cm LA Vol (A2C):   35.2 ml 21.79 ml/m  RA Volume:   40.20 ml  24.89 ml/m LA Vol (A4C):   35.3 ml 21.86 ml/m LA Biplane Vol: 36.7 ml 22.72 ml/m  AORTIC VALVE                     PULMONIC VALVE AV Area (Vmax):    1.20 cm      PV Vmax:       0.89 m/s AV  Area (Vmean):   1.14 cm      PV Peak grad:  3.2 mmHg AV Area (VTI):     1.33 cm AV Vmax:           268.00 cm/s AV Vmean:          174.500 cm/s AV VTI:            0.424 m AV Peak Grad:      28.7 mmHg AV Mean Grad:      14.5 mmHg LVOT Vmax:         142.00 cm/s LVOT Vmean:        87.600 cm/s LVOT VTI:          0.249 m LVOT/AV VTI ratio: 0.59  AORTA Ao Root diam: 3.10 cm Ao Asc diam:  3.60 cm MITRAL VALVE               TRICUSPID VALVE MV Area (PHT): 3.20 cm    TR Peak grad:  32.3 mmHg MV Decel Time: 237 msec    TR Vmax:        284.00 cm/s MV E velocity: 51.40 cm/s MV A velocity: 72.00 cm/s  SHUNTS MV E/A ratio:  0.71        Systemic VTI:  0.25 m                            Systemic Diam: 1.70 cm Nona Dell MD Electronically signed by Nona Dell MD Signature Date/Time: 04/10/2022/11:39:58 AM    Final     Cardiac Studies   Echo 04/10/2022  1. Left ventricular ejection fraction, by estimation, is 70 to 75%. The  left ventricle has hyperdynamic function. The left ventricle has no  regional wall motion abnormalities. There is severe concentric left  ventricular hypertrophy. Left ventricular  diastolic parameters are consistent with Grade I diastolic dysfunction  (impaired relaxation). Mitral chordal SAM noted with turbulent LVOT flow,  not clearly assessed for gradient.   2. Right ventricular systolic function is normal. The right ventricular  size is normal. There is normal pulmonary artery systolic pressure. The  estimated right ventricular systolic pressure is 35.3 mmHg.   3. The mitral valve is abnormal. Mild mitral valve regurgitation.   4. The aortic valve is tricuspid. There is severe calcifcation of the  aortic valve. Aortic valve regurgitation is not visualized. Moderate  aortic valve stenosis, low gradient. Aortic valve area, by VTI measures  1.33 cm. Aortic valve mean gradient  measures 14.5 mmHg. Aortic valve Vmax measures 2.68 m/s.   5. The inferior vena cava is normal in size  with greater than 50%  respiratory variability, suggesting right atrial pressure of 3 mmHg.   Comparison(s): No prior Echocardiogram.   Patient Profile     88 y.o. female HTN, HLD, osteoporosis, AAA and GERD presented for the evaluation of fatigue and diarrhea who found to have a sepsis with SBP in 70s.   Assessment & Plan    Elevated troponin / NSTEMI Hypotension  - Troponin 18,800>> 16,600 in setting of rhabdomyolysis with CK of 5012>>612>>1464. - No reported chest pain or dyspnea. Patient is very active at baseline.  - Echo showed hyperdynamic LV function at 70-75% and grade 1 DD. Mitral chordal SAM noted with turbulent LVOT flow, not clearly assessed for gradient. Severe LVH.   Suspect troponin elevation is related to rhabdo. No cardiac symptoms. Will not pursue any ischemic evaluation at her age of 21.    3. AKI Creatinine remains elevated.   4. Aortic stenosis Mild AS by echo    [facility] will sign off.   Medication Recommendations:   Other recommendations (labs, testing, etc):   Follow up as an outpatient:  with her primary MD and with Dr. Rennis Golden.      Kristeen Miss, MD  04/12/2022 9:05 AM    [facility] [address] [place],  [address] [place], [place]  [address] Phone: [phone]; Fax: [phone]

## 2022-04-12 NOTE — Consult Note (Signed)
NAME:  KEAUNA BRASEL, MRN:  [id number], DOB:  1926-07-17, LOS: 3 ADMISSION DATE:  04/09/2022, CONSULTATION DATE: 10/2 REFERRING MD: Dr. Cyndia Skeeters, CHIEF COMPLAINT: Shock  History of Present Illness:  Mrs. Moor is a 88 year old female with past medical history as below, which is significant for hypertension and hyperlipidemia.  She lives at home and is able to independently perform activities of daily living.  She is somewhat confused and not the most accurate historian, but she does describe diarrhea going back a couple of weeks and diminishing urine output over that same period.  She presented to [facility] long emergency department on 9/30 with complaints of diarrhea and weakness as well as abdominal pain.  In the emergency department she was also noted to be hypotensive with systolic blood pressures in the 70s which did respond to fluid resuscitation.  Laboratory evaluation significant for impaired renal function as well as markedly elevated troponin.  CT of the abdomen and pelvis demonstrated colitis as well as a 2.4 cm possible fluid collection concerning for ovarian cyst.  She was admitted to the hospitalist service.  Cardiology was consulted for markedly elevated troponin, however, they do not feel there is an primary cardiac ischemic cause and the abnormal lab was likely due to demand ischemia.  The patient underwent aggressive IV fluid resuscitation with a total of 5 L of crystalloid.  Echocardiogram with LVEF 70 to 75% and was concerning for mitral chordal SAM with turbulent LV outflow tract flow.  Blood pressures initially responded well to IV fluid, however, in the overnight hours of 10/1 to 10/2 the patient became hypotensive and was started on norepinephrine infusion.  PCCM was consulted for vasopressor management.  Pertinent  Medical History   has a past medical history of GERD (04/10/2007), HYPERLIPIDEMIA (04/13/2007), HYPERTENSION (04/10/2007), Impaired glucose tolerance (09/17/2011),  OSTEOPOROSIS (04/10/2007), and Unspecified vitamin D deficiency (08/26/2009).   Significant Hospital Events: Including procedures, antibiotic start and stop dates in addition to other pertinent events   Presented for abdominal pain, diarrhea, weakness 9/30 10/2 started on norepinephrine for low blood pressures and transferred to ICU 10/2 PM off levophed after additional volume  Interim History / Subjective:  No acute events overnight. Off levo since yesterday evening.   Objective   Blood pressure (!) 112/55, pulse 73, temperature 97.6 F (36.4 C), temperature source Oral, resp. rate 20, height 5\' 3"  (1.6 m), weight 54.9 kg, SpO2 98 %.        Intake/Output Summary (Last 24 hours) at 04/12/2022 0750 Last data filed at 04/12/2022 6712 Gross per 24 hour  Intake 3459.62 ml  Output 350 ml  Net 3109.62 ml    Filed Weights   04/10/22 0500 04/11/22 0500 04/12/22 0500  Weight: 59.4 kg 52.3 kg 54.9 kg    Examination: General: Then elderly female in NAD HENT: [place]/AT, PERRL, no JVD Lungs: clear Cardiovascular:RRR< systolic murmur Abdomen: Soft, NT, ND Extremities: No acute deformity or ROM limitation.  Neuro: Awake, alert, non-focal  Resolved Hospital Problem list     Assessment & Plan:   Shock: Hypovolemic shock plus or minus septic shock.  Source would presumably be colitis based on CT, which also questions pneumonia, however, clinically her symptoms are much more in line with colitis. Lactic acid has cleared. Procalcitonin 1.7. - Improved - Continue empiric antibiotics per primary - Off levo with additional IVF resuscitation.   AKI: dehydration + rhabdomyolysis. Also on lisinopril, HCTZ at home.  - Continue to trend chemistries, CK - Hold home ACE-I and  HCTZ.  - I&O  NSTEMI Dynamic LVOT obstruction - Cardiology following  - No ischemic workup necessary  Hyponatermia Hypophos - per primary  Lung nodules - we an arrange outpatient follow up if she wishes   PCCM  will sign off   Best Practice (right click and "Reselect all SmartList Selections" daily)   Per primary    Joneen Roach, AGACNP-BC [facility]  See Amion for personal pager PCCM on call pager [phone] until 7pm. Please call Elink 7p-7a. [phone]  04/12/2022 8:11 AM

## 2022-04-12 NOTE — Progress Notes (Signed)
Pharmacy Antibiotic Note  Gloria [name] is a 88 y.o. female admitted on 04/09/2022 with sepsis.  Pharmacy was initially consulted for vancomycin and cefepime dosing, now narrowing to Unasyn dosing x4 more days.   Plan: Unasyn 3g IV Q24h Follow up renal function & cultures  Height: 5\' 3"  (160 cm) Weight: 54.9 kg (121 lb 0.5 oz) IBW/kg (Calculated) : 52.4  Temp (24hrs), Avg:97.8 F (36.6 C), Min:97.5 F (36.4 C), Max:98.2 F (36.8 C)  Recent Labs  Lab 04/09/22 1212 04/09/22 1400 04/09/22 1932 04/09/22 2250 04/10/22 0309 04/10/22 1158 04/10/22 1507 04/11/22 0553 04/12/22 0245  WBC 33.7*  --   --   --  29.9*  --   --  22.8* 17.1*  CREATININE 4.46*  --  4.21*  --  4.06*  --   --  4.71* 4.36*  LATICACIDVEN  --  3.7* 3.5* 2.8*  --  2.4* 1.8  --   --   VANCORANDOM  --   --   --   --   --   --   --  12  --      Estimated Creatinine Clearance: 6.7 mL/min (A) (by C-G formula based on SCr of 4.36 mg/dL (H)).    No Known Allergies  Antimicrobials this admission:  9/30 Vanc >> 10/2 9/30 Cefepime >> 10/3 9/30 Flagyl >> 10/3 10/3 Unasyn >> 10/6  Microbiology results:  9/30 BCx x2: ngtd 9/30 C.diff: neg 9/30 GI panel PCR: neg 9/30 MRSA PCR: neg  Thank you for allowing pharmacy to be a part of this patient's care.  Gretta Arab PharmD, BCPS [facility] main pharmacy [phone] 04/12/2022 8:45 AM

## 2022-04-12 NOTE — Progress Notes (Signed)
[facility] Progress Note  Subjective: UOP better w/ 100cc yest and 500cc today so far. Creat down to 4.3 today. Na 130 up a bit. Phos 1.8, will replace.  Mg low too.   Vitals:   04/12/22 0300 04/12/22 0400 04/12/22 0500 04/12/22 0700  BP: (!) 114/54 (!) 108/53  (!) 112/55  Pulse: 74 72  73  Resp: 20 20  20   Temp:  97.6 F (36.4 C)    TempSrc:  Oral    SpO2: 98% 98%  98%  Weight:   54.9 kg   Height:        Exam:  alert, nad , elderly , pleasant  no jvd  Chest cta bilat  Cor reg no RG  Abd soft ntnd no ascites   Ext no LE edema   Alert, NF, ox3       Home meds include - lisinopril/hctz, crestor, vit D, prns     B/l creat 0.75 from 11/16/21     UA cloudy, small LE, prot 100, no bact, 6-10rbc/ 21-50 wbc, 0-5 epi      CT abd /pelv (noncontrast) - Adrenals/Urinary Tract: Adrenals are not enlarged. There is no hydronephrosis. There are no renal or ureteral stones. Urinary bladder is empty.      I/O since admit = 5.8L + and 101 cc UOP = + 5.7 L      Na+ 128  K 3.8  CO2 23  BUN 94  creat 4.71  alb 2.2  LFT"s ^Alt 94, tbili 1.4       WBC 22k  Hb 11       IV levo low dose , dc'd last night         IV abx > cefepime 9/30- current                        Flagyl 9/30 - current                        Vanc 1 gm on 9/30, dc'd     Assessment/ Plan: AKI - b/l creat 0.7 from May 2023.  Creat here 4.4 here in setting of acute diarrheal illness w/ shock / hypotension. Levo dc'd overnight. UA showed wbcs > rbcs, no bacteria. CT abd showed no obstruction. CPK was 6000 > 1000. Only had voided 100cc here and still looked dry. Suspected AKI due to severe vol depletion + ACEi effect, less likely ATN. We re-bolused w/ 2.5 L yesterday and today is making urine ~500cc so far, creat down slightly. Will cont IVFs at 75 /hr for now. F/u labs in am.  Shock - hypovolemic +/- sepsis. +Colitis on CT, also poss pna per CCM. Getting empiric IV abx. BP's better.  Hyponatremia - hypovolemic, as  above, improving.  Hypophos/ hypomag - ordered more IV Naphos and 2 gm MgSO4 HTN - would avoid ACEi/ ARB / diuretics as rx for HTN in this frail elderly lady if possible. BP's now are not high. Follow.       Rob Hershey Knauer 04/12/2022, 8:08 AM   Recent Labs  Lab 04/11/22 0553 04/12/22 0245  HGB 11.1* 10.5*  ALBUMIN 2.2* 2.0*  CALCIUM 7.7* 7.2*  PHOS 1.1* 1.8*  CREATININE 4.71* 4.36*  K 3.8 3.7   No results for input(s): "IRON", "TIBC", "FERRITIN" in the last 168 hours. Inpatient medications:  bisacodyl  10 mg Rectal Daily   Chlorhexidine Gluconate Cloth  6 each Topical Daily  Gerhardt's butt cream   Topical BID   heparin injection (subcutaneous)  5,000 Units Subcutaneous Q8H   midodrine  5 mg Oral TID WC   polyethylene glycol  17 g Oral BID    sodium chloride 10 mL/hr at 04/12/22 0707   ceFEPime (MAXIPIME) IV Stopped (04/11/22 1632)   lactated ringers 75 mL/hr at 04/12/22 0707   metronidazole Stopped (04/12/22 4944)   norepinephrine (LEVOPHED) Adult infusion Stopped (04/11/22 1834)   acetaminophen **OR** acetaminophen, lip balm, ondansetron **OR** ondansetron (ZOFRAN) IV, mouth rinse

## 2022-04-13 ENCOUNTER — Inpatient Hospital Stay (HOSPITAL_COMMUNITY): Payer: PPO

## 2022-04-13 ENCOUNTER — Other Ambulatory Visit: Payer: Self-pay

## 2022-04-13 DIAGNOSIS — R7989 Other specified abnormal findings of blood chemistry: Secondary | ICD-10-CM | POA: Diagnosis not present

## 2022-04-13 DIAGNOSIS — I7121 Aneurysm of the ascending aorta, without rupture: Secondary | ICD-10-CM | POA: Diagnosis not present

## 2022-04-13 DIAGNOSIS — N179 Acute kidney failure, unspecified: Secondary | ICD-10-CM | POA: Diagnosis not present

## 2022-04-13 DIAGNOSIS — R34 Anuria and oliguria: Secondary | ICD-10-CM | POA: Diagnosis not present

## 2022-04-13 LAB — COMPREHENSIVE METABOLIC PANEL
ALT: 55 U/L — ABNORMAL HIGH (ref 0–44)
AST: 92 U/L — ABNORMAL HIGH (ref 15–41)
Albumin: 2 g/dL — ABNORMAL LOW (ref 3.5–5.0)
Alkaline Phosphatase: 72 U/L (ref 38–126)
Anion gap: 13 (ref 5–15)
BUN: 86 mg/dL — ABNORMAL HIGH (ref 8–23)
CO2: 20 mmol/L — ABNORMAL LOW (ref 22–32)
Calcium: 7.4 mg/dL — ABNORMAL LOW (ref 8.9–10.3)
Chloride: 98 mmol/L (ref 98–111)
Creatinine, Ser: 4.66 mg/dL — ABNORMAL HIGH (ref 0.44–1.00)
GFR, Estimated: 8 mL/min — ABNORMAL LOW (ref >60)
Glucose, Bld: 101 mg/dL — ABNORMAL HIGH (ref 70–99)
Potassium: 3.5 mmol/L (ref 3.5–5.1)
Sodium: 131 mmol/L — ABNORMAL LOW (ref 135–145)
Total Bilirubin: 0.8 mg/dL (ref 0.3–1.2)
Total Protein: 4.5 g/dL — ABNORMAL LOW (ref 6.5–8.1)

## 2022-04-13 LAB — CBC
HCT: 31.3 % — ABNORMAL LOW (ref 36.0–46.0)
Hemoglobin: 11 g/dL — ABNORMAL LOW (ref 12.0–15.0)
MCH: 31 pg (ref 26.0–34.0)
MCHC: 35.1 g/dL (ref 30.0–36.0)
MCV: 88.2 fL (ref 80.0–100.0)
Platelets: 128 10*3/uL — ABNORMAL LOW (ref 150–400)
RBC: 3.55 MIL/uL — ABNORMAL LOW (ref 3.87–5.11)
RDW: 13.5 % (ref 11.5–15.5)
WBC: 12.8 10*3/uL — ABNORMAL HIGH (ref 4.0–10.5)
nRBC: 0 % (ref 0.0–0.2)

## 2022-04-13 LAB — CK: Total CK: 253 U/L — ABNORMAL HIGH (ref 38–234)

## 2022-04-13 LAB — MAGNESIUM: Magnesium: 2 mg/dL (ref 1.7–2.4)

## 2022-04-13 LAB — PHOSPHORUS: Phosphorus: 3.9 mg/dL (ref 2.5–4.6)

## 2022-04-13 MED ORDER — POLYETHYLENE GLYCOL 3350 17 G PO PACK: 17.0000 g | PACK | Freq: Two times a day (BID) | ORAL | Status: DC | PRN

## 2022-04-13 MED ORDER — SENNOSIDES-DOCUSATE SODIUM 8.6-50 MG PO TABS: 1.0000 | ORAL_TABLET | Freq: Two times a day (BID) | ORAL | Status: DC | PRN

## 2022-04-13 NOTE — TOC Initial Note (Signed)
Transition of Care [facility]) - Initial/Assessment Note    Patient Details  Name: Gloria [name] MRN: [id number] Date of Birth: Sep 03, 1926  [facility] CM/SW Contact:    Vassie Moselle, LCSW Phone Number: 04/13/2022, 1:04 PM  Clinical Narrative:                 Met with pt's nephew in hallway and discussed recommendation for SNF. He is agreeable to this plan. He states that he lives in [place] and would prefer a facility in the area as this would be more convenient for family. He reports he will not be at the hospital tomorrow however, provided his email address to have a list of facilities emailed to him at: [email or web address].  Pt has been referred out for SNF placement and currently awaiting bed offers.   Expected Discharge Plan: Skilled Nursing Facility Barriers to Discharge: No Barriers Identified   Patient Goals and CMS Choice Patient states their goals for this hospitalization and ongoing recovery are:: To return home CMS Medicare.gov Compare Post Acute Care list provided to:: Patient Represenative (must comment) (Nephew/HCPOA) Choice offered to / list presented to : [place] / Guardian  Expected Discharge Plan and Services Expected Discharge Plan: [place] In-house Referral: NA Discharge Planning Services: CM Consult Post Acute Care Choice: [place] Living arrangements for the past 2 months: Single Family Home                 DME Arranged: N/A DME Agency: NA                  Prior Living Arrangements/Services Living arrangements for the past 2 months: Single Family Home Lives with:: Self Patient language and need for interpreter reviewed:: Yes Do you feel safe going back to the place where you live?: Yes      Need for Family Participation in Patient Care: Yes (Comment) Care giver support system in place?: No (comment) Current home services: DME Criminal Activity/Legal Involvement Pertinent to Current  Situation/Hospitalization: No - Comment as needed  Activities of Daily Living      Permission Sought/Granted Permission sought to share information with : Facility Sport and exercise psychologist, Family Supports Permission granted to share information with : No              Emotional Assessment Appearance:: Appears stated age Attitude/Demeanor/Rapport: Unable to Assess Affect (typically observed): Unable to Assess Orientation: : Oriented to Self, Oriented to Place, Oriented to  Time, Oriented to Situation Alcohol / Substance Use: Not Applicable Psych Involvement: No (comment)  Admission diagnosis:  Hypokalemia [E87.6] NSTEMI (non-ST elevated myocardial infarction) ([place]) [I21.4] Severe sepsis (HCC) [A41.9, R65.20] Acute renal failure, unspecified acute renal failure type ([place]) [N17.9] Sepsis, due to unspecified organism, unspecified whether acute organ dysfunction present [facility]) [A41.9] Patient Active Problem List   Diagnosis Date Noted   NSTEMI (non-ST elevated myocardial infarction) ([place])    Shock ([place])    Rhabdomyolysis 04/10/2022   Anuria 04/10/2022   Elevated troponin 04/09/2022   AKI (acute kidney injury) ([place]) 04/09/2022   Azotemia 04/09/2022   Generalized weakness 04/09/2022   Bandemia 04/09/2022   Hypokalemia 04/09/2022   Elevated AST (SGOT) 04/09/2022   Diarrhea 04/09/2022   Enteritis 04/09/2022   Constipation 04/09/2022   Stercoral colitis 04/09/2022   Dehydration 04/09/2022   Hypovolemic shock ([place]) 04/09/2022   Lactic acidosis 04/09/2022   Aortic atherosclerosis ([place]) 11/11/2020   Ascending aortic aneurysm ([place]) 09/27/2017   Right middle lobe pneumonia  09/15/2017   Wheezing 08/17/2017   Heart murmur 03/26/2012   Impaired glucose tolerance 09/17/2011   Preventative health care 09/17/2011   Vitamin D deficiency 08/26/2009   Hyperlipidemia 04/13/2007   Essential hypertension 04/10/2007   GERD 04/10/2007   Osteoporosis 04/10/2007   PCP:  Biagio Borg,  MD Pharmacy:   [facility] [address], [place] [address] [place] [address] Phone: [phone] Fax: [phone]     Social Determinants of Health (SDOH) Interventions    Readmission Risk Interventions    04/13/2022   12:56 PM  Readmission Risk Prevention Plan  Post Dischage Appt Complete  Medication Screening Complete  Transportation Screening Complete

## 2022-04-13 NOTE — Progress Notes (Signed)
[facility] Progress Note  Subjective: UOP better at 1225 cc yesterday. Creat up again today at 4.6. Occ low BP but SBP 100- 120 for the most part now. Pt w/o any c/o's.   Vitals:   04/12/22 1500 04/12/22 1536 04/12/22 2008 04/13/22 0328  BP: 128/65 120/69 125/68 121/72  Pulse: 86 81 76 75  Resp: (!) 21 (!) 22 20 18   Temp:  97.7 F (36.5 C) 97.9 F (36.6 C) 97.9 F (36.6 C)  TempSrc:  Oral Oral Oral  SpO2: 97% 100% 100% 98%  Weight:    68.6 kg  Height:        Exam:  alert, nad , elderly , pleasant  no jvd  Chest cta bilat  Cor reg no RG  Abd soft ntnd no ascites   Ext no LE edema   Alert, NF, ox3       Home meds include - lisinopril/hctz, crestor, vit D, prns     B/l creat 0.75 from 11/16/21     UA cloudy, small LE, prot 100, no bact, 6-10rbc/ 21-50 wbc, 0-5 epi      CT abd /pelv (noncontrast) - Adrenals/Urinary Tract: Adrenals are not enlarged. There is no hydronephrosis. There are no renal or ureteral stones. Urinary bladder is empty.      I/O since admit = 5.8L + and 101 cc UOP = + 5.7 L      Na+ 128  K 3.8  CO2 23  BUN 94  creat 4.71  alb 2.2  LFT"s ^Alt 94, tbili 1.4       WBC 22k  Hb 11       IV levo low dose , dc'd last night         IV abx > cefepime 9/30- current                        Flagyl 9/30 - current                        Vanc 1 gm on 9/30, dc'd     Assessment/ Plan: AKI - b/l creat 0.7 from May 2023.  Creat 4.4 here in setting of acute diarrheal illness w/ shock / hypotension. On pressors then weaned off. UA showed wbcs > rbcs, no bacteria. CT abd showed no obstruction. CPK was 6000 at peak then declined. Pt was not voiding. Suspected AKI due to severe vol depletion + ace inhibitor. Also possible ATN. Gave another bolus 2.5L 10/2 and continued IVFs at 75 cc/hr. Creat down yesterday but up again today. Now is edematous w/ clear lungs. Has low alb 2.0. Suspect she has ATN which may take time to resolve. No signs of uremia. Will hold off on  further IVFs for now. Will follow.  Shock - hypovolemic and/or septic. BCx's negative. +Colitis on CT, also poss pna. Getting empiric IV abx and WBC much better.  Hyponatremia - improved Hypophos/ hypomag - better after supplementation.  HTN - BP lowering meds on hold, BP's remain on the low side.       Rob Nadiyah Zeis 04/13/2022, 11:35 AM   Recent Labs  Lab 04/12/22 0245 04/13/22 0600  HGB 10.5* 11.0*  ALBUMIN 2.0* 2.0*  CALCIUM 7.2* 7.4*  PHOS 1.8* 3.9  CREATININE 4.36* 4.66*  K 3.7 3.5    No results for input(s): "IRON", "TIBC", "FERRITIN" in the last 168 hours. Inpatient medications:  bisacodyl  10 mg Rectal Daily  Chlorhexidine Gluconate Cloth  6 each Topical Daily   feeding supplement  237 mL Oral Q24H   fiber supplement (BANATROL TF)  60 mL Oral BID   Gerhardt's butt cream   Topical BID   heparin injection (subcutaneous)  5,000 Units Subcutaneous Q8H   midodrine  5 mg Oral TID WC   polyethylene glycol  17 g Oral BID    sodium chloride Stopped (04/12/22 0746)   ampicillin-sulbactam (UNASYN) IV Stopped (04/12/22 1218)   acetaminophen **OR** acetaminophen, lip balm, ondansetron **OR** ondansetron (ZOFRAN) IV, mouth rinse

## 2022-04-13 NOTE — Care Management Important Message (Signed)
Important Message  Patient Details IM Letter given. Name: Gloria [name] MRN: [id number] Date of Birth: [birth year]   Medicare Important Message Given:  Yes     Kerin Salen 04/13/2022, 9:43 AM

## 2022-04-13 NOTE — Progress Notes (Signed)
PROGRESS NOTE  Gloria [name] IPJ:[id number] DOB: Mar 03, 1927   PCP: [name], MD  Patient is from: Home.  Lives alone.  Very independent and active at baseline.  DOA: 04/09/2022 LOS: 4  Chief complaints Chief Complaint  Patient presents with   Hypotension   Diarrhea     Brief Narrative / Interim history: 88 year old F with PMH of HTN, AAA, heart murmur, GERD and osteoporosis brought to ED by EMS due to weakness and diarrhea for 4 to 5 days.  She was hypotensive to 70s when EMS arrived but improved to 80s after 500 cc bolus.  She had significant leukocytosis with lactic acidosis raising concern for infection.  She is also in severe AKI with azotemia.  CT abdomen and pelvis suggests enteritis, stercolitis, moderate to large stool burden, 2.4 cm fluid density in right pelvis, biliary sludge and lingula bronchiectasis with interstitial marking.  She also have markedly elevated troponin to 19,000 that has improved to 17,000.  She had no cardiopulmonary symptoms or EKG findings to suggest ACS.  CK was elevated to 6000 as well.  UA does not suggest UTI.  She was admitted with working diagnosis of non-STEMI, hypotension, severe dehydration, AKI.  She was started on rigorous IV fluid, broad-spectrum antibiotics and admitted.    Started on Levophed peripherally the night of admission due to hypotension.  Blood culture, C. difficile, GIP, MRSA PCR screen negative.  TTE without significant finding.  Renal function worse after interval improvement.  Nephrology and PCCM consulted.  Infectious work-up negative except for CT finding of possible enteritis/stercolitis.  She came off Levophed.  AKI started to improve, and she started to make urine.  Antibiotic de-escalated to IV Unasyn.  Cardiology and PCCM signed off.  Nephrology following.  Subjective: Seen and examined earlier this morning.  No major events overnight or this morning.  No complaints.  Reports having bowel movements.  Urine output  picked up.  She has 1.2 L in the last 24 hours.  She has left upper arm swelling and ecchymosis from peripheral Levophed and IV fluid.   Objective: Vitals:   04/12/22 1536 04/12/22 2008 04/13/22 0328 04/13/22 1356  BP: 120/69 125/68 121/72 116/66  Pulse: 81 76 75 77  Resp: (!) 22 20 18 18   Temp: 97.7 F (36.5 C) 97.9 F (36.6 C) 97.9 F (36.6 C) (!) 97.5 F (36.4 C)  TempSrc: Oral Oral Oral Oral  SpO2: 100% 100% 98% 97%  Weight:   68.6 kg   Height:        Examination:  GENERAL: No apparent distress.  Nontoxic. HEENT: MMM.  Vision and hearing grossly intact.  NECK: Supple.  No apparent JVD.  RESP:  No IWOB.  Fair aeration bilaterally. CVS:  RRR.  2/6 SEM over LUSB and LLSB.  Heart sounds normal.  ABD/GI/GU: BS+. Abd soft, NTND.  MSK/EXT:  Moves extremities.  Swelling in LUE from extravasation.  Ecchymosis. SKIN: no apparent skin lesion or wound NEURO: Awake and alert. Oriented appropriately.  No apparent focal neuro deficit. PSYCH: Calm. Normal affect.   Procedures:  None  Microbiology summarized: MRSA PCR screen negative. C. difficile negative. Blood cultures NGTD GIP negative.  Assessment and plan: Principal Problem:   Elevated troponin Active Problems:   Essential hypertension   GERD   Heart murmur   Ascending aortic aneurysm (HCC)   AKI (acute kidney injury) (HCC)   Azotemia   Generalized weakness   Bandemia   Hypokalemia   Elevated AST (SGOT)  Diarrhea   Enteritis   Constipation   Stercoral colitis   Dehydration   Hypovolemic shock (HCC)   Lactic acidosis   Rhabdomyolysis   Anuria   NSTEMI (non-ST elevated myocardial infarction) (HCC)   Shock (HCC)  Markedly elevated troponin/possible non-STEMI: No cardiopulmonary symptoms or signs of ischemia on EKG. She is also severely dehydrated with severe AKI which might be contributing.  Also concern for infectious process but no clear source other than some enteritis.  TTE with LVEF of 70 to 75%, G1 DD  and moderate AVS and mitral chordal S.A.M with turbulent LVOT flow.  -Cardiology signed off.   AKI/azotemia: Likely prerenal in the setting of GI loss and concurrent use of ACE inhibitors and HCTZ.  No signs of obstruction on CT. CK 6000>>> 538.  UOP 1.2 L / 24 hours.  Net +8 L Recent Labs    11/16/21 0903 04/09/22 1212 04/09/22 1932 04/10/22 0309 04/11/22 0553 04/12/22 0245 04/13/22 0600  BUN 14 109* 100* 93* 94* 84* 86*  CREATININE 0.75 4.46* 4.21* 4.06* 4.71* 4.36* 4.66*  -Nephrology following-hold off IV fluid -Continue holding home lisinopril/HCTZ -Strict intake and output   Severe dehydration/hypovolemic shock: Resolved. -PCCM signed off. -Continue low-dose midodrine   Diarrhea/enteritis/possible stercolitis/constipation: C. difficile and GIP negative.  Abdominal exam benign.  Having diarrhea likely from scheduled bowel regimen.  KUB without fecal impaction but few slightly dilated small bowel loops suggesting ileus -Change in bowel regimen to as needed -Continue IV Unasyn -Mobilize as able.  Leukocytosis/bandemia/lactic acidosis: Likely due to dehydration, AKI and intra-abdominal infection.  Diarrhea and CT findings suggest enteritis and stercolitis but abdominal exam benign.  No respiratory or UTI symptoms.  C. difficile and GIP negative.  Lactic acidosis resolved.  Leukocytosis improved -Antibiotic as above  Nontraumatic rhabdomyolysis/elevated AST: LFT improved.  Rhabdo resolving. -Continue monitoring   Abnormal CT-some concern about lung nodules in RUL and 2.4 cm fluid density in pelvis suggesting ovarian cyst or an artifact. -Outpatient follow-up for RUL nodules -Follow-up pelvic sonogram in 3 months for pelvic cyst   Generalized weakness-in the setting of dehydration, diarrhea, AKI, azotemia... Patient is very independent and active at baseline.  Now requiring 2 person assist -PT/OT recommended SNF.   Heart murmur/moderate aortic valve stenosis/dynamic LVOT  obstruction: TTE as above.   Goal of care discussion: Discussed CODE STATUS including pros and cons of CPR and intubation.  Patient prefers to be DNR.  Nephew, HCPOA in agreement.  Hypokalemia/hypophosphatemia/hyponatremia:  -Per nephrology.  Increased nutrient needs Body mass index is 26.79 kg/m. -Consult dietitian   Pressure skin injury: POA Pressure Injury 04/09/22 Buttocks Right Deep Tissue Pressure Injury - Purple or maroon localized area of discolored intact skin or blood-filled blister due to damage of underlying soft tissue from pressure and/or shear. 6X2.5cm (Active)  04/09/22 1815  Location: Buttocks  Location Orientation: Right  Staging: Deep Tissue Pressure Injury - Purple or maroon localized area of discolored intact skin or blood-filled blister due to damage of underlying soft tissue from pressure and/or shear.  Wound Description (Comments): 6X2.5cm  Present on Admission: Yes  Dressing Type Foam - Lift dressing to assess site every shift 04/12/22 0800   DVT prophylaxis:  heparin injection 5,000 Units Start: 04/10/22 1415    Code Status: DNR/DNI Family Communication: Updated patient's nephew, H POA at bedside Level of care: Telemetry Status is: Inpatient Remains inpatient appropriate because: AKI and physical deconditioning   Final disposition: SNF Consultants:  Pulmonology-signed off Cardiology-signed off Nephrology  Sch Meds:  Scheduled Meds:  Chlorhexidine Gluconate Cloth  6 each Topical Daily   feeding supplement  237 mL Oral Q24H   fiber supplement (BANATROL TF)  60 mL Oral BID   Gerhardt's butt cream   Topical BID   heparin injection (subcutaneous)  5,000 Units Subcutaneous Q8H   midodrine  5 mg Oral TID WC   Continuous Infusions:  sodium chloride Stopped (04/12/22 0746)   ampicillin-sulbactam (UNASYN) IV 3 g (04/13/22 1229)   PRN Meds:.acetaminophen **OR** acetaminophen, lip balm, ondansetron **OR** ondansetron (ZOFRAN) IV, mouth rinse,  polyethylene glycol, senna-docusate  Antimicrobials: Anti-infectives (From admission, onward)    Start     Dose/Rate Route Frequency Ordered Stop   04/12/22 1200  Ampicillin-Sulbactam (UNASYN) 3 g in sodium chloride 0.9 % 100 mL IVPB        3 g 200 mL/hr over 30 Minutes Intravenous Every 24 hours 04/12/22 0844 04/16/22 1159   04/10/22 1500  ceFEPIme (MAXIPIME) 1 g in sodium chloride 0.9 % 100 mL IVPB  Status:  Discontinued        1 g 200 mL/hr over 30 Minutes Intravenous Every 24 hours 04/09/22 1711 04/12/22 0815   04/10/22 0200  metroNIDAZOLE (FLAGYL) IVPB 500 mg  Status:  Discontinued        500 mg 100 mL/hr over 60 Minutes Intravenous Every 12 hours 04/09/22 1702 04/12/22 0815   04/09/22 1710  vancomycin variable dose per unstable renal function (pharmacist dosing)  Status:  Discontinued         Does not apply See admin instructions 04/09/22 1710 04/11/22 0737   04/09/22 1415  ceFEPIme (MAXIPIME) 2 g in sodium chloride 0.9 % 100 mL IVPB        2 g 200 mL/hr over 30 Minutes Intravenous  Once 04/09/22 1408 04/09/22 1451   04/09/22 1415  metroNIDAZOLE (FLAGYL) IVPB 500 mg        500 mg 100 mL/hr over 60 Minutes Intravenous  Once 04/09/22 1408 04/09/22 1559   04/09/22 1415  vancomycin (VANCOCIN) IVPB 1000 mg/200 mL premix        1,000 mg 200 mL/hr over 60 Minutes Intravenous  Once 04/09/22 1408 04/09/22 1659        I have personally reviewed the following labs and images: CBC: Recent Labs  Lab 04/09/22 1212 04/10/22 0309 04/11/22 0553 04/12/22 0245 04/13/22 0600  WBC 33.7* 29.9* 22.8* 17.1* 12.8*  NEUTROABS 29.0*  --   --   --   --   HGB 15.1* 12.1 11.1* 10.5* 11.0*  HCT 41.9 33.4* 31.5* 30.2* 31.3*  MCV 86.4 87.0 89.0 89.1 88.2  PLT 205 153 139* 121* 128*   BMP &GFR Recent Labs  Lab 04/09/22 1212 04/09/22 1932 04/10/22 0309 04/11/22 0553 04/12/22 0245 04/13/22 0600  NA 135 133* 130* 128* 130* 131*  K <2.0* 2.7* 4.0 3.8 3.7 3.5  CL 86* 91* 94* 95* 101 98  CO2  31 26 25 23 22  20*  GLUCOSE 162* 132* 137* 113* 101* 101*  BUN 109* 100* 93* 94* 84* 86*  CREATININE 4.46* 4.21* 4.06* 4.71* 4.36* 4.66*  CALCIUM 9.2 8.5* 8.0* 7.7* 7.2* 7.4*  MG 2.9*  --  2.0 1.8 1.6* 2.0  PHOS  --  2.4* 1.3* 1.1* 1.8* 3.9   Estimated Creatinine Clearance: 7 mL/min (A) (by C-G formula based on SCr of 4.66 mg/dL (H)). Liver & Pancreas: Recent Labs  Lab 04/09/22 1212 04/09/22 1932 04/10/22 0309 04/11/22 0553 04/12/22 0245 04/13/22 0600  AST 128*  --  137* 94* 93* 92*  ALT 41  --  41 41 46* 55*  ALKPHOS 113  --  86 78 70 72  BILITOT 2.2*  --  1.9* 1.4* 0.9 0.8  PROT 6.9  --  5.1* 4.7* 4.4* 4.5*  ALBUMIN 3.6 2.9* 2.4* 2.2* 2.0* 2.0*   No results for input(s): "LIPASE", "AMYLASE" in the last 168 hours. No results for input(s): "AMMONIA" in the last 168 hours. Diabetic: No results for input(s): "HGBA1C" in the last 72 hours. No results for input(s): "GLUCAP" in the last 168 hours. Cardiac Enzymes: Recent Labs  Lab 04/09/22 1602 04/10/22 0309 04/11/22 0553 04/12/22 0245 04/13/22 0600  CKTOTAL 5,012* 612* 1,464* 538* 253*   No results for input(s): "PROBNP" in the last 8760 hours. Coagulation Profile: Recent Labs  Lab 04/09/22 1212  INR 1.0   Thyroid Function Tests: No results for input(s): "TSH", "T4TOTAL", "FREET4", "T3FREE", "THYROIDAB" in the last 72 hours. Lipid Profile: No results for input(s): "CHOL", "HDL", "LDLCALC", "TRIG", "CHOLHDL", "LDLDIRECT" in the last 72 hours. Anemia Panel: No results for input(s): "VITAMINB12", "FOLATE", "FERRITIN", "TIBC", "IRON", "RETICCTPCT" in the last 72 hours. Urine analysis:    Component Value Date/Time   COLORURINE AMBER (A) 04/09/2022 1140   APPEARANCEUR CLOUDY (A) 04/09/2022 1140   LABSPEC 1.016 04/09/2022 1140   PHURINE 5.0 04/09/2022 1140   GLUCOSEU 50 (A) 04/09/2022 1140   GLUCOSEU NEGATIVE 11/16/2021 0903   HGBUR MODERATE (A) 04/09/2022 1140   BILIRUBINUR NEGATIVE 04/09/2022 1140   KETONESUR  NEGATIVE 04/09/2022 1140   PROTEINUR 100 (A) 04/09/2022 1140   UROBILINOGEN 0.2 11/16/2021 0903   NITRITE NEGATIVE 04/09/2022 1140   LEUKOCYTESUR SMALL (A) 04/09/2022 1140   Sepsis Labs: Invalid input(s): "PROCALCITONIN", "LACTICIDVEN"  Microbiology: Recent Results (from the past 240 hour(s))  Blood Culture (routine x 2)     Status: None (Preliminary result)   Collection Time: 04/09/22 11:55 AM   Specimen: BLOOD  Result Value Ref Range Status   Specimen Description   Final    BLOOD RIGHT ANTECUBITAL Performed at [facility] [address] [place], [place] [address]    Special Requests   Final    BOTTLES DRAWN AEROBIC AND ANAEROBIC Blood Culture adequate volume Performed at [facility] [address], [place], [place] [address]    Culture   Final    NO GROWTH 4 DAYS Performed at [facility], [place] [address] [place], [place] [address]    Report Status PENDING  Incomplete  Blood Culture (routine x 2)     Status: None (Preliminary result)   Collection Time: 04/09/22 12:00 PM   Specimen: BLOOD  Result Value Ref Range Status   Specimen Description   Final    BLOOD BLOOD LEFT HAND Performed at [facility] [address], [place], [place] [address]    Special Requests   Final    BOTTLES DRAWN AEROBIC ONLY Blood Culture results may not be optimal due to an inadequate volume of blood received in culture bottles Performed at [facility] [address] [place], [place] [address]    Culture   Final    NO GROWTH 4 DAYS Performed at [facility], [place] [address], [place], [place] [address]    Report Status PENDING  Incomplete  C Difficile Quick Screen w PCR reflex     Status: None   Collection Time: 04/09/22  6:07 PM   Specimen: STOOL  Result Value Ref Range  Status   C Diff antigen NEGATIVE NEGATIVE Final   C Diff toxin NEGATIVE NEGATIVE Final   C Diff interpretation No C. difficile  detected.  Final    Comment: Performed at [facility], [address], [place], [place] [address]  Gastrointestinal Panel by PCR , Stool     Status: None   Collection Time: 04/09/22  6:07 PM   Specimen: STOOL  Result Value Ref Range Status   Campylobacter species NOT DETECTED NOT DETECTED Final   Plesimonas shigelloides NOT DETECTED NOT DETECTED Final   Salmonella species NOT DETECTED NOT DETECTED Final   Yersinia enterocolitica NOT DETECTED NOT DETECTED Final   Vibrio species NOT DETECTED NOT DETECTED Final   Vibrio cholerae NOT DETECTED NOT DETECTED Final   Enteroaggregative E coli (EAEC) NOT DETECTED NOT DETECTED Final   Enteropathogenic E coli (EPEC) NOT DETECTED NOT DETECTED Final   Enterotoxigenic E coli (ETEC) NOT DETECTED NOT DETECTED Final   Shiga like toxin producing E coli (STEC) NOT DETECTED NOT DETECTED Final   Shigella/Enteroinvasive E coli (EIEC) NOT DETECTED NOT DETECTED Final   Cryptosporidium NOT DETECTED NOT DETECTED Final   Cyclospora cayetanensis NOT DETECTED NOT DETECTED Final   Entamoeba histolytica NOT DETECTED NOT DETECTED Final   Giardia lamblia NOT DETECTED NOT DETECTED Final   Adenovirus F40/41 NOT DETECTED NOT DETECTED Final   Astrovirus NOT DETECTED NOT DETECTED Final   Norovirus GI/GII NOT DETECTED NOT DETECTED Final   Rotavirus A NOT DETECTED NOT DETECTED Final   Sapovirus (I, II, IV, and V) NOT DETECTED NOT DETECTED Final    Comment: Performed at [facility], [address] [place], [place] [address]  MRSA Next Gen by PCR, Nasal     Status: None   Collection Time: 04/09/22  6:28 PM   Specimen: Nasal Mucosa; Nasal Swab  Result Value Ref Range Status   MRSA by PCR Next Gen NOT DETECTED NOT DETECTED Final    Comment: (NOTE) The GeneXpert MRSA Assay (FDA approved for NASAL specimens only), is one component of a comprehensive MRSA colonization surveillance program. It is not intended to diagnose MRSA infection nor to  guide or monitor treatment for MRSA infections. Test performance is not FDA approved in patients less than 100 years old. Performed at [facility], [address], [place], [place] [address]     Radiology Studies: DG Abd Portable 1V  Result Date: 04/13/2022 CLINICAL DATA:  Constipation EXAM: PORTABLE ABDOMEN - 1 VIEW COMPARISON:  CT done on 04/09/2022 FINDINGS: There is presence of gas in slightly dilated small bowel loops suggesting ileus. Gas is present in colon. There is small amount of stool in colon. There is no evidence of fecal impaction in rectosigmoid. No abnormal masses or calcifications are seen. IMPRESSION: Presence of gas in few slightly dilated small bowel loops may suggest ileus. There is no evidence of fecal impaction in rectosigmoid. Electronically Signed   By: Ernie Avena M.D.   On: 04/13/2022 13:26      Tiarah Shisler T. Paisely Brick [facility]  If 7PM-7AM, please contact night-coverage [email or web address] 04/13/2022, 4:06 PM

## 2022-04-13 NOTE — [place] FL2 (Signed)
[place] MEDICAID FL2 LEVEL OF CARE SCREENING TOOL     IDENTIFICATION  Patient Name: Gloria [name] Birthdate: 1927/02/02 Sex: female Admission Date (Current Location): 04/09/2022  [facility] and [place] Number:  Producer, television/film/video and Address:  [facility],  [address] [place], [place] [address]      Provider Number: [id number]  Attending Physician Name and Address:  Almon Hercules, MD  Relative Name and Phone Number:  Harriet Pho [phone]    Current Level of Care: Hospital Recommended Level of Care: Skilled Nursing Facility Prior Approval Number:    Date Approved/Denied:   PASRR Number: [id number]  Discharge Plan: SNF    Current Diagnoses: Patient Active Problem List   Diagnosis Date Noted   NSTEMI (non-ST elevated myocardial infarction) (HCC)    Shock (HCC)    Rhabdomyolysis 04/10/2022   Anuria 04/10/2022   Elevated troponin 04/09/2022   AKI (acute kidney injury) (HCC) 04/09/2022   Azotemia 04/09/2022   Generalized weakness 04/09/2022   Bandemia 04/09/2022   Hypokalemia 04/09/2022   Elevated AST (SGOT) 04/09/2022   Diarrhea 04/09/2022   Enteritis 04/09/2022   Constipation 04/09/2022   Stercoral colitis 04/09/2022   Dehydration 04/09/2022   Hypovolemic shock (HCC) 04/09/2022   Lactic acidosis 04/09/2022   Aortic atherosclerosis (HCC) 11/11/2020   Ascending aortic aneurysm (HCC) 09/27/2017   Right middle lobe pneumonia 09/15/2017   Wheezing 08/17/2017   Heart murmur 03/26/2012   Impaired glucose tolerance 09/17/2011   Preventative health care 09/17/2011   Vitamin D deficiency 08/26/2009   Hyperlipidemia 04/13/2007   Essential hypertension 04/10/2007   GERD 04/10/2007   Osteoporosis 04/10/2007    Orientation RESPIRATION BLADDER Height & Weight     Self, Time, Situation, Place  Normal Incontinent, External catheter Weight: 151 lb 3.8 oz (68.6 kg) Height:  5\' 3"  (160 cm)  BEHAVIORAL SYMPTOMS/MOOD NEUROLOGICAL BOWEL NUTRITION  STATUS      Incontinent Diet (Regular)  AMBULATORY STATUS COMMUNICATION OF NEEDS Skin   Extensive Assist Verbally Normal                       Personal Care Assistance Level of Assistance  Bathing, Feeding, Dressing Bathing Assistance: Maximum assistance Feeding assistance: Limited assistance Dressing Assistance: Maximum assistance     Functional Limitations Info  Sight, Hearing, Speech Sight Info: Adequate Hearing Info: Adequate Speech Info: Adequate    SPECIAL CARE FACTORS FREQUENCY  PT (By licensed PT), OT (By licensed OT)     PT Frequency: 5x/wk OT Frequency: 5x/wk            Contractures Contractures Info: Not present    Additional Factors Info  Code Status, Allergies Code Status Info: DNR Allergies Info: No Known Allergies           Current Medications (04/13/2022):  This is the current hospital active medication list Current Facility-Administered Medications  Medication Dose Route Frequency Provider Last Rate Last Admin   0.9 %  sodium chloride infusion  250 mL Intravenous Continuous 06/13/2022, MD   Stopped at 04/12/22 0746   acetaminophen (TYLENOL) tablet 650 mg  650 mg Oral Q6H PRN 06/12/22, MD       Or   acetaminophen (TYLENOL) suppository 650 mg  650 mg Rectal Q6H PRN Gonfa, Taye T, MD       Ampicillin-Sulbactam (UNASYN) 3 g in sodium chloride 0.9 % 100 mL IVPB  3 g Intravenous Q24H Gonfa, Taye T, MD 200 mL/hr at  04/13/22 1229 3 g at 04/13/22 1229   Chlorhexidine Gluconate Cloth 2 % PADS 6 each  6 each Topical Daily Wendee Beavers T, MD   6 each at 04/11/22 1100   feeding supplement (ENSURE ENLIVE / ENSURE PLUS) liquid 237 mL  237 mL Oral Q24H Gonfa, Taye T, MD   237 mL at 04/12/22 1408   fiber supplement (BANATROL TF) liquid 60 mL  60 mL Oral BID Wendee Beavers T, MD   60 mL at 04/13/22 0814   Gerhardt's butt cream   Topical BID Mercy Riding, MD   Given at 04/13/22 0907   heparin injection 5,000 Units  5,000 Units Subcutaneous Q8H Wendee Beavers  T, MD   5,000 Units at 04/13/22 0509   lip balm (CARMEX) ointment   Topical PRN Mercy Riding, MD       midodrine (PROAMATINE) tablet 5 mg  5 mg Oral TID WC Gonfa, Taye T, MD   5 mg at 04/13/22 1214   ondansetron (ZOFRAN) tablet 4 mg  4 mg Oral Q6H PRN Mercy Riding, MD       Or   ondansetron (ZOFRAN) injection 4 mg  4 mg Intravenous Q6H PRN Gonfa, Taye T, MD       Oral care mouth rinse  15 mL Mouth Rinse PRN Gonfa, Taye T, MD       polyethylene glycol (MIRALAX / GLYCOLAX) packet 17 g  17 g Oral BID PRN Gonfa, Taye T, MD       senna-docusate (Senokot-S) tablet 1 tablet  1 tablet Oral BID PRN Mercy Riding, MD         Discharge Medications: Please see discharge summary for a list of discharge medications.  Relevant Imaging Results:  Relevant Lab Results:   Additional Information SSN: [id number]  Vassie Moselle, LCSW

## 2022-04-14 DIAGNOSIS — N179 Acute kidney failure, unspecified: Secondary | ICD-10-CM | POA: Diagnosis not present

## 2022-04-14 DIAGNOSIS — I7121 Aneurysm of the ascending aorta, without rupture: Secondary | ICD-10-CM | POA: Diagnosis not present

## 2022-04-14 DIAGNOSIS — R7989 Other specified abnormal findings of blood chemistry: Secondary | ICD-10-CM | POA: Diagnosis not present

## 2022-04-14 DIAGNOSIS — D72825 Bandemia: Secondary | ICD-10-CM | POA: Diagnosis not present

## 2022-04-14 LAB — COMPREHENSIVE METABOLIC PANEL
ALT: 52 U/L — ABNORMAL HIGH (ref 0–44)
AST: 66 U/L — ABNORMAL HIGH (ref 15–41)
Albumin: 2.1 g/dL — ABNORMAL LOW (ref 3.5–5.0)
Alkaline Phosphatase: 74 U/L (ref 38–126)
Anion gap: 12 (ref 5–15)
BUN: 88 mg/dL — ABNORMAL HIGH (ref 8–23)
CO2: 22 mmol/L (ref 22–32)
Calcium: 7.8 mg/dL — ABNORMAL LOW (ref 8.9–10.3)
Chloride: 100 mmol/L (ref 98–111)
Creatinine, Ser: 4.67 mg/dL — ABNORMAL HIGH (ref 0.44–1.00)
GFR, Estimated: 8 mL/min — ABNORMAL LOW (ref >60)
Glucose, Bld: 126 mg/dL — ABNORMAL HIGH (ref 70–99)
Potassium: 3.1 mmol/L — ABNORMAL LOW (ref 3.5–5.1)
Sodium: 134 mmol/L — ABNORMAL LOW (ref 135–145)
Total Bilirubin: 0.8 mg/dL (ref 0.3–1.2)
Total Protein: 4.9 g/dL — ABNORMAL LOW (ref 6.5–8.1)

## 2022-04-14 LAB — CBC
HCT: 31.9 % — ABNORMAL LOW (ref 36.0–46.0)
Hemoglobin: 11.1 g/dL — ABNORMAL LOW (ref 12.0–15.0)
MCH: 30.9 pg (ref 26.0–34.0)
MCHC: 34.8 g/dL (ref 30.0–36.0)
MCV: 88.9 fL (ref 80.0–100.0)
Platelets: 137 10*3/uL — ABNORMAL LOW (ref 150–400)
RBC: 3.59 MIL/uL — ABNORMAL LOW (ref 3.87–5.11)
RDW: 13.6 % (ref 11.5–15.5)
WBC: 11.1 10*3/uL — ABNORMAL HIGH (ref 4.0–10.5)
nRBC: 0 % (ref 0.0–0.2)

## 2022-04-14 LAB — CULTURE, BLOOD (ROUTINE X 2)
Culture: NO GROWTH
Culture: NO GROWTH
Special Requests: ADEQUATE

## 2022-04-14 LAB — PHOSPHORUS: Phosphorus: 3.6 mg/dL (ref 2.5–4.6)

## 2022-04-14 LAB — MAGNESIUM: Magnesium: 2 mg/dL (ref 1.7–2.4)

## 2022-04-14 LAB — CK: Total CK: 124 U/L (ref 38–234)

## 2022-04-14 MED ORDER — DILTIAZEM HCL 25 MG/5ML IV SOLN
15.0000 mg | Freq: Once | INTRAVENOUS | Status: AC
  Administered 2022-04-14: 15 mg via INTRAVENOUS
  Filled 2022-04-14: qty 5

## 2022-04-14 MED ORDER — METOPROLOL TARTRATE 5 MG/5ML IV SOLN
2.5000 mg | INTRAVENOUS | Status: DC | PRN
  Administered 2022-04-14 – 2022-04-15 (×3): 2.5 mg via INTRAVENOUS
  Filled 2022-04-14 (×3): qty 5

## 2022-04-14 MED ORDER — LACTATED RINGERS IV BOLUS
500.0000 mL | Freq: Once | INTRAVENOUS | Status: AC
  Administered 2022-04-14: 500 mL via INTRAVENOUS

## 2022-04-14 MED ORDER — FUROSEMIDE 10 MG/ML IJ SOLN
40.0000 mg | Freq: Two times a day (BID) | INTRAMUSCULAR | Status: AC
  Administered 2022-04-14 – 2022-04-15 (×2): 40 mg via INTRAVENOUS
  Filled 2022-04-14 (×2): qty 4

## 2022-04-14 MED ORDER — DILTIAZEM LOAD VIA INFUSION: 15.0000 mg | Freq: Once | INTRAVENOUS | Status: DC

## 2022-04-14 MED ORDER — POTASSIUM CHLORIDE CRYS ER 20 MEQ PO TBCR
40.0000 meq | EXTENDED_RELEASE_TABLET | Freq: Once | ORAL | Status: AC
  Administered 2022-04-14: 40 meq via ORAL
  Filled 2022-04-14: qty 2

## 2022-04-14 NOTE — Progress Notes (Signed)
   04/14/22 1619  Assess: MEWS Score  Temp (!) 97.5 F (36.4 C)  BP 117/74  MAP (mmHg) 87  Pulse Rate (!) 148  Resp 20  Level of Consciousness Alert  SpO2 98 %  O2 Device Room Air  Assess: MEWS Score  MEWS Temp 0  MEWS Systolic 0  MEWS Pulse 3  MEWS RR 0  MEWS LOC 0  MEWS Score 3  MEWS Score Color Yellow  Assess: if the MEWS score is Yellow or Red  Were vital signs taken at a resting state? Yes  Focused Assessment No change from prior assessment  Does the patient meet 2 or more of the SIRS criteria? Yes  Does the patient have a confirmed or suspected source of infection? Yes  Provider and Rapid Response Notified? Yes  MEWS guidelines implemented *See Row Information* Yes  Treat  MEWS Interventions Escalated (See documentation below)  Pain Scale 0-10  Pain Score 0  Take Vital Signs  Increase Vital Sign Frequency  Yellow: Q 2hr X 2 then Q 4hr X 2, if remains yellow, continue Q 4hrs  Escalate  MEWS: Escalate Yellow: discuss with charge nurse/RN and consider discussing with provider and RRT  Notify: Charge Nurse/RN  Name of Charge Nurse/RN Notified Amada Jupiter, RN  Date Charge Nurse/RN Notified 04/14/22  Time Charge Nurse/RN Notified 1634  Notify: Provider  Provider Name/Title Dr. Bettina Gavia  Date Provider Notified 04/14/22  Time Provider Notified 1615  Method of Notification Page  Notification Reason Change in status  Provider response See new orders  Date of Provider Response 04/14/22  Time of Provider Response 1616  Document  Patient Outcome Stabilized after interventions  Progress note created (see row info) Yes  Assess: SIRS CRITERIA  SIRS Temperature  0  SIRS Pulse 1  SIRS Respirations  0  SIRS WBC 1  SIRS Score Sum  2

## 2022-04-14 NOTE — Progress Notes (Signed)
PROGRESS NOTE  Gloria [name] OVF:[id number] DOB: 1927-06-27   PCP: Biagio Borg, MD  Patient is from: Home.  Lives alone.  Very independent and active at baseline.  DOA: 04/09/2022 LOS: 5  Chief complaints Chief Complaint  Patient presents with   Hypotension   Diarrhea     Brief Narrative / Interim history: 88 year old F with PMH of HTN, AAA, heart murmur, GERD and osteoporosis brought to ED by EMS due to weakness and diarrhea for 4 to 5 days.  She was hypotensive to 70s when EMS arrived but improved to 80s after 500 cc bolus.  She had significant leukocytosis with lactic acidosis raising concern for infection.  She is also in severe AKI with azotemia.  CT abdomen and pelvis suggests enteritis, stercolitis, moderate to large stool burden, 2.4 cm fluid density in right pelvis, biliary sludge and lingula bronchiectasis with interstitial marking.  She also have markedly elevated troponin to 19,000 that has improved to 17,000.  She had no cardiopulmonary symptoms or EKG findings to suggest ACS.  CK was elevated to 6000 as well.  UA does not suggest UTI.  She was admitted with working diagnosis of non-STEMI, hypotension, severe dehydration, AKI.  She was started on rigorous IV fluid, broad-spectrum antibiotics and admitted.    Started on Levophed peripherally the night of admission due to hypotension.  Blood culture, C. difficile, GIP, MRSA PCR screen negative.  TTE without significant finding.  Renal function worse after interval improvement.  Nephrology and PCCM consulted.  Infectious work-up negative except for CT finding of possible enteritis/stercolitis.  She came off Levophed.  AKI started to improve, and she started to make urine.  Antibiotic de-escalated to IV Unasyn.  Cardiology and PCCM signed off.  Cr remained elevated but stable.  Cleared for discharge by nephrology for outpatient follow-up.  Therapy recommended SNF.  Subjective: Seen and examined earlier this morning.  No major  events overnight of this morning.  No complaints but not a great historian.  She denies pain, nausea, vomiting or shortness of breath.  I and O incomplete.  Only 400 cc charted from daytime yesterday.  Objective: Vitals:   04/13/22 1356 04/13/22 2043 04/14/22 0535 04/14/22 1226  BP: 116/66 120/73 107/65 120/70  Pulse: 77 78 94 88  Resp: 18 18 20 18   Temp: (!) 97.5 F (36.4 C) 98.7 F (37.1 C) 98 F (36.7 C) (!) 97.3 F (36.3 C)  TempSrc: Oral   Oral  SpO2: 97% 97% 97% 97%  Weight:      Height:        Examination:  GENERAL: No apparent distress.  Nontoxic. HEENT: MMM.  Vision and hearing grossly intact.  NECK: Supple.  No apparent JVD.  RESP:  No IWOB.  Fair aeration bilaterally. CVS:  RRR.  2/6 SEM over LUSB and LLSB.  Heart sounds normal.  ABD/GI/GU: BS+. Abd soft, NTND.  MSK/EXT:  Moves extremities.  Left upper extremity edema but improved. SKIN: Swelling and ecchymosis in left arm but improved. NEURO: Awake and alert. Oriented fairly.  No apparent focal neuro deficit. PSYCH: Calm. Normal affect.   Procedures:  None  Microbiology summarized: MRSA PCR screen negative. C. difficile negative. Blood cultures NGTD GIP negative.  Assessment and plan: Principal Problem:   Elevated troponin Active Problems:   Essential hypertension   GERD   Heart murmur   Ascending aortic aneurysm (HCC)   AKI (acute kidney injury) (HCC)   Azotemia   Generalized weakness   Bandemia  Hypokalemia   Elevated AST (SGOT)   Diarrhea   Enteritis   Constipation   Stercoral colitis   Dehydration   Hypovolemic shock (HCC)   Lactic acidosis   Rhabdomyolysis   Anuria   NSTEMI (non-ST elevated myocardial infarction) (HCC)   Shock (HCC)  Markedly elevated troponin/possible non-STEMI: No cardiopulmonary symptoms or signs of ischemia on EKG. She is also severely dehydrated with severe AKI which might be contributing.  Also concern for infectious process but no clear source other than some  enteritis.  TTE with LVEF of 70 to 75%, G1 DD and moderate AVS and mitral chordal S.A.M with turbulent LVOT flow.  -Cardiology signed off.   AKI/azotemia: Likely prerenal in the setting of GI loss and concurrent use of ACE inhibitors and HCTZ.  No signs of obstruction on CT. CK 6000>>> 538.  I and O incomplete.  Net +7.7 L. Recent Labs    11/16/21 0903 04/09/22 1212 04/09/22 1932 04/10/22 0309 04/11/22 0553 04/12/22 0245 04/13/22 0600 04/14/22 0524  BUN 14 109* 100* 93* 94* 84* 86* 88*  CREATININE 0.75 4.46* 4.21* 4.06* 4.71* 4.36* 4.66* 4.67*  -Nephrology signed off.  They will arrange outpatient follow-up -Continue holding home lisinopril/HCTZ.  Discontinue on discharge -Strict intake and output   Severe dehydration/hypovolemic shock: Resolved. -PCCM signed off. -Continue low-dose midodrine   Diarrhea/enteritis/possible stercolitis/constipation: C. difficile and GIP negative.  Abdominal exam benign.  Having diarrhea likely from scheduled bowel regimen.  KUB without fecal impaction but few slightly dilated small bowel loops suggesting ileus -Bowel regimen as needed. -Continue IV Unasyn until 10/7 -Mobilize as able.  Leukocytosis/bandemia/lactic acidosis: Likely due to dehydration, AKI and intra-abdominal infection.  Diarrhea and CT findings suggest enteritis and stercolitis but abdominal exam benign.  No respiratory or UTI symptoms.  C. difficile and GIP negative.  Lactic acidosis resolved.  Leukocytosis improved -Antibiotic as above  Nontraumatic rhabdomyolysis/elevated AST: LFT improved.  Rhabdomyolysis resolved.   Abnormal CT-some concern about lung nodules in RUL and 2.4 cm fluid density in pelvis suggesting ovarian cyst or an artifact. -Outpatient follow-up for RUL nodules -Follow-up pelvic sonogram in 3 months for pelvic cyst   Generalized weakness-in the setting of dehydration, diarrhea, AKI, azotemia... Patient is very independent and active at baseline.  Now requiring  2 person assist -PT/OT recommended SNF.   Heart murmur/moderate aortic valve stenosis/dynamic LVOT obstruction: TTE as above.   Goal of care discussion: Discussed CODE STATUS including pros and cons of CPR and intubation.  Patient prefers to be DNR.  Nephew, HCPOA in agreement.  Hypokalemia/hypophosphatemia/hyponatremia:  -P.o. KCl 40x1  Increased nutrient needs Body mass index is 26.79 kg/m. -Consult dietitian   Pressure skin injury: POA Pressure Injury 04/09/22 Buttocks Right Deep Tissue Pressure Injury - Purple or maroon localized area of discolored intact skin or blood-filled blister due to damage of underlying soft tissue from pressure and/or shear. 6X2.5cm (Active)  04/09/22 1815  Location: Buttocks  Location Orientation: Right  Staging: Deep Tissue Pressure Injury - Purple or maroon localized area of discolored intact skin or blood-filled blister due to damage of underlying soft tissue from pressure and/or shear.  Wound Description (Comments): 6X2.5cm  Present on Admission: Yes  Dressing Type Other (Comment) (butt cream) 04/14/22 0911   DVT prophylaxis:  heparin injection 5,000 Units Start: 04/10/22 1415    Code Status: DNR/DNI Family Communication: Updated patient's other nephew at bedside Level of care: Telemetry Status is: Inpatient Remains inpatient appropriate because: AKI and physical deconditioning   Final disposition: SNF  Consultants:  Pulmonology-signed off Cardiology-signed off Nephrology  Sch Meds:  Scheduled Meds:  feeding supplement  237 mL Oral Q24H   fiber supplement (BANATROL TF)  60 mL Oral BID   furosemide  40 mg Intravenous Q12H   Gerhardt's butt cream   Topical BID   heparin injection (subcutaneous)  5,000 Units Subcutaneous Q8H   midodrine  5 mg Oral TID WC   Continuous Infusions:  sodium chloride Stopped (04/12/22 0746)   ampicillin-sulbactam (UNASYN) IV Stopped (04/13/22 1330)   PRN Meds:.acetaminophen **OR** acetaminophen, lip  balm, ondansetron **OR** ondansetron (ZOFRAN) IV, mouth rinse, polyethylene glycol, senna-docusate  Antimicrobials: Anti-infectives (From admission, onward)    Start     Dose/Rate Route Frequency Ordered Stop   04/12/22 1200  Ampicillin-Sulbactam (UNASYN) 3 g in sodium chloride 0.9 % 100 mL IVPB        3 g 200 mL/hr over 30 Minutes Intravenous Every 24 hours 04/12/22 0844 04/16/22 1159   04/10/22 1500  ceFEPIme (MAXIPIME) 1 g in sodium chloride 0.9 % 100 mL IVPB  Status:  Discontinued        1 g 200 mL/hr over 30 Minutes Intravenous Every 24 hours 04/09/22 1711 04/12/22 0815   04/10/22 0200  metroNIDAZOLE (FLAGYL) IVPB 500 mg  Status:  Discontinued        500 mg 100 mL/hr over 60 Minutes Intravenous Every 12 hours 04/09/22 1702 04/12/22 0815   04/09/22 1710  vancomycin variable dose per unstable renal function (pharmacist dosing)  Status:  Discontinued         Does not apply See admin instructions 04/09/22 1710 04/11/22 0737   04/09/22 1415  ceFEPIme (MAXIPIME) 2 g in sodium chloride 0.9 % 100 mL IVPB        2 g 200 mL/hr over 30 Minutes Intravenous  Once 04/09/22 1408 04/09/22 1451   04/09/22 1415  metroNIDAZOLE (FLAGYL) IVPB 500 mg        500 mg 100 mL/hr over 60 Minutes Intravenous  Once 04/09/22 1408 04/09/22 1559   04/09/22 1415  vancomycin (VANCOCIN) IVPB 1000 mg/200 mL premix        1,000 mg 200 mL/hr over 60 Minutes Intravenous  Once 04/09/22 1408 04/09/22 1659        I have personally reviewed the following labs and images: CBC: Recent Labs  Lab 04/09/22 1212 04/10/22 0309 04/11/22 0553 04/12/22 0245 04/13/22 0600 04/14/22 0524  WBC 33.7* 29.9* 22.8* 17.1* 12.8* 11.1*  NEUTROABS 29.0*  --   --   --   --   --   HGB 15.1* 12.1 11.1* 10.5* 11.0* 11.1*  HCT 41.9 33.4* 31.5* 30.2* 31.3* 31.9*  MCV 86.4 87.0 89.0 89.1 88.2 88.9  PLT 205 153 139* 121* 128* 137*   BMP &GFR Recent Labs  Lab 04/10/22 0309 04/11/22 0553 04/12/22 0245 04/13/22 0600 04/14/22 0524   NA 130* 128* 130* 131* 134*  K 4.0 3.8 3.7 3.5 3.1*  CL 94* 95* 101 98 100  CO2 25 23 22  20* 22  GLUCOSE 137* 113* 101* 101* 126*  BUN 93* 94* 84* 86* 88*  CREATININE 4.06* 4.71* 4.36* 4.66* 4.67*  CALCIUM 8.0* 7.7* 7.2* 7.4* 7.8*  MG 2.0 1.8 1.6* 2.0 2.0  PHOS 1.3* 1.1* 1.8* 3.9 3.6   Estimated Creatinine Clearance: 7 mL/min (A) (by C-G formula based on SCr of 4.67 mg/dL (H)). Liver & Pancreas: Recent Labs  Lab 04/10/22 0309 04/11/22 0553 04/12/22 0245 04/13/22 0600 04/14/22 0524  AST 137* 94* 93*  92* 66*  ALT 41 41 46* 55* 52*  ALKPHOS 86 78 70 72 74  BILITOT 1.9* 1.4* 0.9 0.8 0.8  PROT 5.1* 4.7* 4.4* 4.5* 4.9*  ALBUMIN 2.4* 2.2* 2.0* 2.0* 2.1*   No results for input(s): "LIPASE", "AMYLASE" in the last 168 hours. No results for input(s): "AMMONIA" in the last 168 hours. Diabetic: No results for input(s): "HGBA1C" in the last 72 hours. No results for input(s): "GLUCAP" in the last 168 hours. Cardiac Enzymes: Recent Labs  Lab 04/10/22 0309 04/11/22 0553 04/12/22 0245 04/13/22 0600 04/14/22 0524  CKTOTAL 612* 1,464* 538* 253* 124   No results for input(s): "PROBNP" in the last 8760 hours. Coagulation Profile: Recent Labs  Lab 04/09/22 1212  INR 1.0   Thyroid Function Tests: No results for input(s): "TSH", "T4TOTAL", "FREET4", "T3FREE", "THYROIDAB" in the last 72 hours. Lipid Profile: No results for input(s): "CHOL", "HDL", "LDLCALC", "TRIG", "CHOLHDL", "LDLDIRECT" in the last 72 hours. Anemia Panel: No results for input(s): "VITAMINB12", "FOLATE", "FERRITIN", "TIBC", "IRON", "RETICCTPCT" in the last 72 hours. Urine analysis:    Component Value Date/Time   COLORURINE AMBER (A) 04/09/2022 1140   APPEARANCEUR CLOUDY (A) 04/09/2022 1140   LABSPEC 1.016 04/09/2022 1140   PHURINE 5.0 04/09/2022 1140   GLUCOSEU 50 (A) 04/09/2022 1140   GLUCOSEU NEGATIVE 11/16/2021 0903   HGBUR MODERATE (A) 04/09/2022 1140   BILIRUBINUR NEGATIVE 04/09/2022 1140   KETONESUR  NEGATIVE 04/09/2022 1140   PROTEINUR 100 (A) 04/09/2022 1140   UROBILINOGEN 0.2 11/16/2021 0903   NITRITE NEGATIVE 04/09/2022 1140   LEUKOCYTESUR SMALL (A) 04/09/2022 1140   Sepsis Labs: Invalid input(s): "PROCALCITONIN", "LACTICIDVEN"  Microbiology: Recent Results (from the past 240 hour(s))  Blood Culture (routine x 2)     Status: None   Collection Time: 04/09/22 11:55 AM   Specimen: BLOOD  Result Value Ref Range Status   Specimen Description   Final    BLOOD RIGHT ANTECUBITAL Performed at [facility], [address] [place], [place] [address]    Special Requests   Final    BOTTLES DRAWN AEROBIC AND ANAEROBIC Blood Culture adequate volume Performed at [facility], [address] [place], [place] [address]    Culture   Final    NO GROWTH 5 DAYS Performed at [facility], [address] [place], [place] [address]    Report Status 04/14/2022 FINAL  Final  Blood Culture (routine x 2)     Status: None   Collection Time: 04/09/22 12:00 PM   Specimen: BLOOD  Result Value Ref Range Status   Specimen Description   Final    BLOOD BLOOD LEFT HAND Performed at [facility], [address] [place], [place] [address]    Special Requests   Final    BOTTLES DRAWN AEROBIC ONLY Blood Culture results may not be optimal due to an inadequate volume of blood received in culture bottles Performed at [facility], [address] [place], [place] [address]    Culture   Final    NO GROWTH 5 DAYS Performed at [facility], [address] [place], [place] [address]    Report Status 04/14/2022 FINAL  Final  C Difficile Quick Screen w PCR reflex     Status: None   Collection Time: 04/09/22  6:07 PM   Specimen: STOOL  Result Value Ref Range Status   C Diff antigen NEGATIVE NEGATIVE Final   C Diff toxin NEGATIVE NEGATIVE Final  C Diff interpretation No C. difficile detected.  Final    Comment:  Performed at [facility], [address] [place], [place] [address]  Gastrointestinal Panel by PCR , Stool     Status: None   Collection Time: 04/09/22  6:07 PM   Specimen: STOOL  Result Value Ref Range Status   Campylobacter species NOT DETECTED NOT DETECTED Final   Plesimonas shigelloides NOT DETECTED NOT DETECTED Final   Salmonella species NOT DETECTED NOT DETECTED Final   Yersinia enterocolitica NOT DETECTED NOT DETECTED Final   Vibrio species NOT DETECTED NOT DETECTED Final   Vibrio cholerae NOT DETECTED NOT DETECTED Final   Enteroaggregative E coli (EAEC) NOT DETECTED NOT DETECTED Final   Enteropathogenic E coli (EPEC) NOT DETECTED NOT DETECTED Final   Enterotoxigenic E coli (ETEC) NOT DETECTED NOT DETECTED Final   Shiga like toxin producing E coli (STEC) NOT DETECTED NOT DETECTED Final   Shigella/Enteroinvasive E coli (EIEC) NOT DETECTED NOT DETECTED Final   Cryptosporidium NOT DETECTED NOT DETECTED Final   Cyclospora cayetanensis NOT DETECTED NOT DETECTED Final   Entamoeba histolytica NOT DETECTED NOT DETECTED Final   Giardia lamblia NOT DETECTED NOT DETECTED Final   Adenovirus F40/41 NOT DETECTED NOT DETECTED Final   Astrovirus NOT DETECTED NOT DETECTED Final   Norovirus GI/GII NOT DETECTED NOT DETECTED Final   Rotavirus A NOT DETECTED NOT DETECTED Final   Sapovirus (I, II, IV, and V) NOT DETECTED NOT DETECTED Final    Comment: Performed at [facility], [address] [place], [place] [address]  MRSA Next Gen by PCR, Nasal     Status: None   Collection Time: 04/09/22  6:28 PM   Specimen: Nasal Mucosa; Nasal Swab  Result Value Ref Range Status   MRSA by PCR Next Gen NOT DETECTED NOT DETECTED Final    Comment: (NOTE) The GeneXpert MRSA Assay (FDA approved for NASAL specimens only), is one component of a comprehensive MRSA colonization surveillance program. It is not intended to diagnose MRSA infection nor to guide or monitor treatment for  MRSA infections. Test performance is not FDA approved in patients less than 28 years old. Performed at [facility], [address] [place], [place] [address]     Radiology Studies: No results found.    Luismario Coston T. Keiran Sias [facility]  If 7PM-7AM, please contact night-coverage [email or web address] 04/14/2022, 1:58 PM

## 2022-04-14 NOTE — TOC Progression Note (Signed)
Transition of Care [facility]) - Progression Note    Patient Details  Name: Gloria [name] MRN: [id number] Date of Birth: 1927/06/23  Transition of Care [facility]) CM/SW [place], [place] Phone Number: 04/14/2022, 11:34 AM  Clinical Narrative:    CSW spoke with pt's nephew over the phone and reviewed bed offers for SNF. Pt's nephew plans to visit several of the facilities today and also requested CSW contact [facility] for placement. CSW left message for [facility] admissions coordinator.     Expected Discharge Plan: [place] Barriers to Discharge: No Barriers Identified  Expected Discharge Plan and Services Expected Discharge Plan: [place] In-house Referral: NA Discharge Planning Services: CM Consult Post Acute Care Choice: [place] Living arrangements for the past 2 months: Single Family Home                 DME Arranged: N/A DME Agency: NA                   Social Determinants of Health (SDOH) Interventions    Readmission Risk Interventions    04/14/2022   11:34 AM 04/13/2022   12:56 PM  Readmission Risk Prevention Plan  Post Dischage Appt Complete Complete  Medication Screening Complete Complete  Transportation Screening Complete Complete

## 2022-04-14 NOTE — Plan of Care (Signed)

## 2022-04-14 NOTE — Progress Notes (Addendum)
[facility] Progress Note  Subjective: UOP stable w/ 600cc yest.  Creat stable at 4.6.   Vitals:   04/13/22 0328 04/13/22 1356 04/13/22 2043 04/14/22 0535  BP: 121/72 116/66 120/73 107/65  Pulse: 75 77 78 94  Resp: 18 18 18 20   Temp: 97.9 F (36.6 C) (!) 97.5 F (36.4 C) 98.7 F (37.1 C) 98 F (36.7 C)  TempSrc: Oral Oral    SpO2: 98% 97% 97% 97%  Weight: 68.6 kg     Height:        Exam:  alert, nad , elderly , pleasant  no jvd  Chest cta bilat  Cor reg no RG  Abd soft ntnd no ascites   Ext no LE edema   Alert, NF, ox3       Home meds include - lisinopril/hctz, crestor, vit D, prns     B/l creat 0.75 from 11/16/21     UA cloudy, small LE, prot 100, no bact, 6-10rbc/ 21-50 wbc, 0-5 epi      CT abd /pelv (noncontrast) - Adrenals/Urinary Tract: Adrenals are not enlarged. There is no hydronephrosis. There are no renal or ureteral stones. Urinary bladder is empty.      I/O since admit = 5.8L + and 101 cc UOP = + 5.7 L      Na+ 128  K 3.8  CO2 23  BUN 94  creat 4.71  alb 2.2  LFT"s ^Alt 94, tbili 1.4       WBC 22k  Hb 11       IV levo low dose , dc'd last night         IV abx > cefepime 9/30- current                        Flagyl 9/30 - current                        Vanc 1 gm on 9/30, dc'd     Assessment/ Plan: AKI - b/l creat 0.7 from May 2023.  Creat 4.4 here in setting of acute diarrheal illness w/ shock / hypotension. On pressors then weaned off. UA showed wbcs > rbcs, no bacteria. CT abd showed no obstruction. CPK was 6000 at peak then declined. Pt was not voiding. Suspected AKI due to severe vol depletion + ace inhibitor. Also possible ATN. Gave another bolus 2.5L 10/2 and continued IVFs at 75 cc/hr. Creat down yesterday but up again today. Now is edematous w/ clear lungs. Has low alb 2.0. Suspect ATN which is nonoliguric and not recovering yet. No signs of uremia. Eating/ drinking, IVF's dc'd. She is okay for dc from renal standpoint, will need close f/u  in our office which we will arrange. Call w/ any questions. Will sign off.  Shock - hypovolemic and/or septic. BCx's negative. +Colitis on CT, also poss pna. Getting empiric IV abx and WBC much better.  Hyponatremia - improved Hypophos/ hypomag - better after supplementation.  HTN - BP lowering meds on hold, BP's remain on the low side. Avoid acei/ ARB/ diuretics for BP control for this patient in the future.       Rob Dravin Lance 04/14/2022, 7:34 AM   Recent Labs  Lab 04/13/22 0600 04/14/22 0524  HGB 11.0* 11.1*  ALBUMIN 2.0* 2.1*  CALCIUM 7.4* 7.8*  PHOS 3.9 3.6  CREATININE 4.66* 4.67*  K 3.5 3.1*    No results for input(s): "IRON", "TIBC", "  FERRITIN" in the last 168 hours. Inpatient medications:  Chlorhexidine Gluconate Cloth  6 each Topical Daily   feeding supplement  237 mL Oral Q24H   fiber supplement (BANATROL TF)  60 mL Oral BID   Gerhardt's butt cream   Topical BID   heparin injection (subcutaneous)  5,000 Units Subcutaneous Q8H   midodrine  5 mg Oral TID WC    sodium chloride Stopped (04/12/22 0746)   ampicillin-sulbactam (UNASYN) IV Stopped (04/13/22 1330)   acetaminophen **OR** acetaminophen, lip balm, ondansetron **OR** ondansetron (ZOFRAN) IV, mouth rinse, polyethylene glycol, senna-docusate

## 2022-04-15 ENCOUNTER — Inpatient Hospital Stay (HOSPITAL_COMMUNITY): Payer: PPO

## 2022-04-15 DIAGNOSIS — N179 Acute kidney failure, unspecified: Secondary | ICD-10-CM | POA: Diagnosis not present

## 2022-04-15 DIAGNOSIS — I7121 Aneurysm of the ascending aorta, without rupture: Secondary | ICD-10-CM | POA: Diagnosis not present

## 2022-04-15 DIAGNOSIS — R7989 Other specified abnormal findings of blood chemistry: Secondary | ICD-10-CM | POA: Diagnosis not present

## 2022-04-15 DIAGNOSIS — I471 Supraventricular tachycardia, unspecified: Secondary | ICD-10-CM

## 2022-04-15 DIAGNOSIS — R34 Anuria and oliguria: Secondary | ICD-10-CM | POA: Diagnosis not present

## 2022-04-15 LAB — RENAL FUNCTION PANEL
Albumin: 2.2 g/dL — ABNORMAL LOW (ref 3.5–5.0)
Anion gap: 11 (ref 5–15)
BUN: 91 mg/dL — ABNORMAL HIGH (ref 8–23)
CO2: 23 mmol/L (ref 22–32)
Calcium: 7.9 mg/dL — ABNORMAL LOW (ref 8.9–10.3)
Chloride: 100 mmol/L (ref 98–111)
Creatinine, Ser: 5.04 mg/dL — ABNORMAL HIGH (ref 0.44–1.00)
GFR, Estimated: 8 mL/min — ABNORMAL LOW (ref >60)
Glucose, Bld: 108 mg/dL — ABNORMAL HIGH (ref 70–99)
Phosphorus: 3.5 mg/dL (ref 2.5–4.6)
Potassium: 3.6 mmol/L (ref 3.5–5.1)
Sodium: 134 mmol/L — ABNORMAL LOW (ref 135–145)

## 2022-04-15 LAB — CBC
HCT: 31.5 % — ABNORMAL LOW (ref 36.0–46.0)
Hemoglobin: 10.7 g/dL — ABNORMAL LOW (ref 12.0–15.0)
MCH: 31.3 pg (ref 26.0–34.0)
MCHC: 34 g/dL (ref 30.0–36.0)
MCV: 92.1 fL (ref 80.0–100.0)
Platelets: 141 10*3/uL — ABNORMAL LOW (ref 150–400)
RBC: 3.42 MIL/uL — ABNORMAL LOW (ref 3.87–5.11)
RDW: 13.9 % (ref 11.5–15.5)
WBC: 11.9 10*3/uL — ABNORMAL HIGH (ref 4.0–10.5)
nRBC: 0 % (ref 0.0–0.2)

## 2022-04-15 LAB — MAGNESIUM: Magnesium: 2 mg/dL (ref 1.7–2.4)

## 2022-04-15 MED ORDER — SODIUM CHLORIDE 0.9 % IV BOLUS
500.0000 mL | Freq: Once | INTRAVENOUS | Status: AC
  Administered 2022-04-15: 500 mL via INTRAVENOUS

## 2022-04-15 NOTE — Progress Notes (Signed)
PROGRESS NOTE  Gloria [name] IEP:[id number] DOB: [birth year]   PCP: Biagio Borg, MD  Patient is from: Home.  Lives alone.  Very independent and active at baseline.  DOA: 04/09/2022 LOS: 6  Chief complaints Chief Complaint  Patient presents with   Hypotension   Diarrhea     Brief Narrative / Interim history: 88 year old F with PMH of HTN, AAA, heart murmur, GERD and osteoporosis brought to ED by EMS due to weakness and diarrhea for 4 to 5 days.  She was hypotensive to 70s when EMS arrived but improved to 80s after 500 cc bolus.  She had significant leukocytosis with lactic acidosis raising concern for infection.  She is also in severe AKI with azotemia.  CT abdomen and pelvis suggests enteritis, stercolitis, moderate to large stool burden, 2.4 cm fluid density in right pelvis, biliary sludge and lingula bronchiectasis with interstitial marking.  She also have markedly elevated troponin to 19,000 that has improved to 17,000.  She had no cardiopulmonary symptoms or EKG findings to suggest ACS.  CK was elevated to 6000 as well.  UA does not suggest UTI.  She was admitted with working diagnosis of non-STEMI, hypotension, severe dehydration, AKI.  She was started on rigorous IV fluid, broad-spectrum antibiotics and admitted.    Started on Levophed peripherally the night of admission due to hypotension.  Blood culture, C. difficile, GIP, MRSA PCR screen negative.  TTE without significant finding.  Renal function worse after interval improvement.  Nephrology and PCCM consulted.  Infectious work-up negative except for CT finding of possible enteritis/stercolitis.  She came off Levophed.  AKI started to improve, and she started to make urine.  Antibiotic de-escalated to IV Unasyn.  Cardiology and PCCM signed off.  Nephrology commended outpatient follow-up and signed off but creatinine continued to trend up.  She also has intermittent SVT.   Subjective: Seen and examined earlier this morning and  this afternoon.  No major events overnight.  She had what looks like SVT with heart rate to 160s but not symptomatic.  Blood pressure normal but soft.  She is sitting on bedside chair without distress.  She denies pain, shortness of breath or palpitation.  She is not eating or drinking much.  She had good urine output after IV Lasix and IV fluid yesterday but creatinine and BUN went up.  Objective: Vitals:   04/15/22 0018 04/15/22 0253 04/15/22 0426 04/15/22 1338  BP: 113/63 (!) 106/51 111/60 112/64  Pulse: 85 76 88 95  Resp: 20   (!) 22  Temp: 97.6 F (36.4 C)  (!) 97.5 F (36.4 C) 97.7 F (36.5 C)  TempSrc:   Oral Oral  SpO2: 96% 98% 98% 98%  Weight:      Height:        Examination:  GENERAL: Sitting on bedside chair.  No apparent distress.  HEENT: MMM.  Vision and hearing grossly intact.  NECK: Supple.  No apparent JVD.  RESP:  No IWOB.  Fair aeration bilaterally. CVS: tachycardic.  2/6 SEM over LUSB and LLSB.  Heart sounds normal.  ABD/GI/GU: BS+. Abd soft, NTND.  MSK/EXT:  Moves extremities.  Some edema in LUE.  Ecchymosis in LUE. SKIN: Phimosis in LUE. NEURO: Awake and alert.  Fairly oriented.  No apparent focal neuro deficit. PSYCH: Calm. Normal affect.   Procedures:  None  Microbiology summarized: MRSA PCR screen negative. C. difficile negative. Blood cultures NGTD GIP negative.  Assessment and plan: Principal Problem:   Elevated troponin Active Problems:  Essential hypertension   GERD   Heart murmur   Ascending aortic aneurysm (HCC)   AKI (acute kidney injury) (HCC)   Azotemia   Generalized weakness   Bandemia   Hypokalemia   Elevated AST (SGOT)   Diarrhea   Enteritis   Constipation   Stercoral colitis   Dehydration   Hypovolemic shock (HCC)   Lactic acidosis   Rhabdomyolysis   Anuria   NSTEMI (non-ST elevated myocardial infarction) (HCC)   Shock (HCC)  Markedly elevated troponin/possible non-STEMI: No cardiopulmonary symptoms or signs of  ischemia on EKG. She is also severely dehydrated with severe AKI which might be contributing.  Also concern for infectious process but no clear source other than some enteritis.  TTE with LVEF of 70 to 75%, G1 DD and moderate AVS and mitral chordal S.A.M with turbulent LVOT flow.  -Cardiology signed off.  Intermittent SVT: Due to dehydration?  EKG read as A-fib with RVR on 10/5 but she has P waves.  Able to break SVT with push Cardizem and IV fluid.  Went into SVT with HR to 166 again afternoon -IV metoprolol 2.5 mg as needed sustained HR > 120 with hold parameters -Intermittent NS bolus 500 cc.  We will continue intermittent boluses. -Hold off further diuretics given p.o. intake   AKI/azotemia: Likely prerenal in the setting of GI loss and concurrent use of ACE inhibitors and HCTZ.  No signs of obstruction on CT. CK 6000>>> 538.  About 1.6 L UOP/24 hours.  Net +7 L. Recent Labs    11/16/21 0903 04/09/22 1212 04/09/22 1932 04/10/22 0309 04/11/22 0553 04/12/22 0245 04/13/22 0600 04/14/22 0524 04/15/22 0608  BUN 14 109* 100* 93* 94* 84* 86* 88* 91*  CREATININE 0.75 4.46* 4.21* 4.06* 4.71* 4.36* 4.66* 4.67* 5.04*  -Nephrology signed off.  They will arrange outpatient follow-up -Continue holding home lisinopril/HCTZ.  Discontinue on discharge -Check renal ultrasound -Continue intermittent boluses -Strict intake and output -Bladder scan as needed   Severe dehydration/hypovolemic shock: Resolved. -PCCM signed off. -Continue low-dose midodrine   Diarrhea/enteritis/possible stercolitis/constipation: C. difficile and GIP negative.  Abdominal exam benign.  Having diarrhea likely from scheduled bowel regimen.  KUB without fecal impaction but few slightly dilated small bowel loops suggesting ileus -Bowel regimen as needed. -Continue IV Unasyn until 10/7 -Mobilize as able.  Leukocytosis/bandemia/lactic acidosis: Likely due to dehydration, AKI and intra-abdominal infection.  Diarrhea and CT  findings suggest enteritis and stercolitis but abdominal exam benign.  No respiratory or UTI symptoms.  C. difficile and GIP negative.  Lactic acidosis resolved.  Leukocytosis improved -Antibiotic as above  Nontraumatic rhabdomyolysis/elevated AST: LFT improved.  Rhabdomyolysis resolved.   Abnormal CT-some concern about lung nodules in RUL and 2.4 cm fluid density in pelvis suggesting ovarian cyst or an artifact. -Outpatient follow-up for RUL nodules -Follow-up pelvic sonogram in 3 months for pelvic cyst   Generalized weakness-in the setting of dehydration, diarrhea, AKI, azotemia... Patient is very independent and active at baseline.  Now requiring 2 person assist -PT/OT recommended SNF.   Heart murmur/moderate aortic valve stenosis/dynamic LVOT obstruction: TTE as above.   Goal of care discussion: Discussed CODE STATUS including pros and cons of CPR and intubation.  Patient prefers to be DNR.  Nephew, HCPOA in agreement.  Hypokalemia/hypophosphatemia/hyponatremia:  -P.o. KCl 40x1  Increased nutrient needs Body mass index is 26.79 kg/m. -Consult dietitian   Pressure skin injury: POA Pressure Injury 04/09/22 Buttocks Right Deep Tissue Pressure Injury - Purple or maroon localized area of discolored intact skin  or blood-filled blister due to damage of underlying soft tissue from pressure and/or shear. 6X2.5cm (Active)  04/09/22 1815  Location: Buttocks  Location Orientation: Right  Staging: Deep Tissue Pressure Injury - Purple or maroon localized area of discolored intact skin or blood-filled blister due to damage of underlying soft tissue from pressure and/or shear.  Wound Description (Comments): 6X2.5cm  Present on Admission: Yes  Dressing Type Foam - Lift dressing to assess site every shift 04/14/22 2017   DVT prophylaxis:  heparin injection 5,000 Units Start: 04/10/22 1415    Code Status: DNR/DNI Family Communication: Updated family member at bedside.  Her nephew's  wife Level of care: Telemetry Status is: Inpatient Remains inpatient appropriate because: AKI, intermittent SVT and physical deconditioning   Final disposition: SNF Consultants:  Pulmonology-signed off Cardiology-signed off Nephrology  Sch Meds:  Scheduled Meds:  feeding supplement  237 mL Oral Q24H   fiber supplement (BANATROL TF)  60 mL Oral BID   Gerhardt's butt cream   Topical BID   heparin injection (subcutaneous)  5,000 Units Subcutaneous Q8H   midodrine  5 mg Oral TID WC   Continuous Infusions:  sodium chloride Stopped (04/12/22 0746)   PRN Meds:.acetaminophen **OR** acetaminophen, lip balm, metoprolol tartrate, ondansetron **OR** ondansetron (ZOFRAN) IV, mouth rinse, polyethylene glycol, senna-docusate  Antimicrobials: Anti-infectives (From admission, onward)    Start     Dose/Rate Route Frequency Ordered Stop   04/12/22 1200  Ampicillin-Sulbactam (UNASYN) 3 g in sodium chloride 0.9 % 100 mL IVPB        3 g 200 mL/hr over 30 Minutes Intravenous Every 24 hours 04/12/22 0844 04/15/22 1308   04/10/22 1500  ceFEPIme (MAXIPIME) 1 g in sodium chloride 0.9 % 100 mL IVPB  Status:  Discontinued        1 g 200 mL/hr over 30 Minutes Intravenous Every 24 hours 04/09/22 1711 04/12/22 0815   04/10/22 0200  metroNIDAZOLE (FLAGYL) IVPB 500 mg  Status:  Discontinued        500 mg 100 mL/hr over 60 Minutes Intravenous Every 12 hours 04/09/22 1702 04/12/22 0815   04/09/22 1710  vancomycin variable dose per unstable renal function (pharmacist dosing)  Status:  Discontinued         Does not apply See admin instructions 04/09/22 1710 04/11/22 0737   04/09/22 1415  ceFEPIme (MAXIPIME) 2 g in sodium chloride 0.9 % 100 mL IVPB        2 g 200 mL/hr over 30 Minutes Intravenous  Once 04/09/22 1408 04/09/22 1451   04/09/22 1415  metroNIDAZOLE (FLAGYL) IVPB 500 mg        500 mg 100 mL/hr over 60 Minutes Intravenous  Once 04/09/22 1408 04/09/22 1559   04/09/22 1415  vancomycin (VANCOCIN) IVPB  1000 mg/200 mL premix        1,000 mg 200 mL/hr over 60 Minutes Intravenous  Once 04/09/22 1408 04/09/22 1659        I have personally reviewed the following labs and images: CBC: Recent Labs  Lab 04/09/22 1212 04/10/22 0309 04/11/22 0553 04/12/22 0245 04/13/22 0600 04/14/22 0524 04/15/22 0608  WBC 33.7*   < > 22.8* 17.1* 12.8* 11.1* 11.9*  NEUTROABS 29.0*  --   --   --   --   --   --   HGB 15.1*   < > 11.1* 10.5* 11.0* 11.1* 10.7*  HCT 41.9   < > 31.5* 30.2* 31.3* 31.9* 31.5*  MCV 86.4   < > 89.0 89.1 88.2  88.9 92.1  PLT 205   < > 139* 121* 128* 137* 141*   < > = values in this interval not displayed.   BMP &GFR Recent Labs  Lab 04/11/22 0553 04/12/22 0245 04/13/22 0600 04/14/22 0524 04/15/22 0608  NA 128* 130* 131* 134* 134*  K 3.8 3.7 3.5 3.1* 3.6  CL 95* 101 98 100 100  CO2 23 22 20* 22 23  GLUCOSE 113* 101* 101* 126* 108*  BUN 94* 84* 86* 88* 91*  CREATININE 4.71* 4.36* 4.66* 4.67* 5.04*  CALCIUM 7.7* 7.2* 7.4* 7.8* 7.9*  MG 1.8 1.6* 2.0 2.0 2.0  PHOS 1.1* 1.8* 3.9 3.6 3.5   Estimated Creatinine Clearance: 6.5 mL/min (A) (by C-G formula based on SCr of 5.04 mg/dL (H)). Liver & Pancreas: Recent Labs  Lab 04/10/22 0309 04/11/22 0553 04/12/22 0245 04/13/22 0600 04/14/22 0524 04/15/22 0608  AST 137* 94* 93* 92* 66*  --   ALT 41 41 46* 55* 52*  --   ALKPHOS 86 78 70 72 74  --   BILITOT 1.9* 1.4* 0.9 0.8 0.8  --   PROT 5.1* 4.7* 4.4* 4.5* 4.9*  --   ALBUMIN 2.4* 2.2* 2.0* 2.0* 2.1* 2.2*   No results for input(s): "LIPASE", "AMYLASE" in the last 168 hours. No results for input(s): "AMMONIA" in the last 168 hours. Diabetic: No results for input(s): "HGBA1C" in the last 72 hours. No results for input(s): "GLUCAP" in the last 168 hours. Cardiac Enzymes: Recent Labs  Lab 04/10/22 0309 04/11/22 0553 04/12/22 0245 04/13/22 0600 04/14/22 0524  CKTOTAL 612* 1,464* 538* 253* 124   No results for input(s): "PROBNP" in the last 8760 hours. Coagulation  Profile: Recent Labs  Lab 04/09/22 1212  INR 1.0   Thyroid Function Tests: No results for input(s): "TSH", "T4TOTAL", "FREET4", "T3FREE", "THYROIDAB" in the last 72 hours. Lipid Profile: No results for input(s): "CHOL", "HDL", "LDLCALC", "TRIG", "CHOLHDL", "LDLDIRECT" in the last 72 hours. Anemia Panel: No results for input(s): "VITAMINB12", "FOLATE", "FERRITIN", "TIBC", "IRON", "RETICCTPCT" in the last 72 hours. Urine analysis:    Component Value Date/Time   COLORURINE AMBER (A) 04/09/2022 1140   APPEARANCEUR CLOUDY (A) 04/09/2022 1140   LABSPEC 1.016 04/09/2022 1140   PHURINE 5.0 04/09/2022 1140   GLUCOSEU 50 (A) 04/09/2022 1140   GLUCOSEU NEGATIVE 11/16/2021 0903   HGBUR MODERATE (A) 04/09/2022 1140   BILIRUBINUR NEGATIVE 04/09/2022 1140   KETONESUR NEGATIVE 04/09/2022 1140   PROTEINUR 100 (A) 04/09/2022 1140   UROBILINOGEN 0.2 11/16/2021 0903   NITRITE NEGATIVE 04/09/2022 1140   LEUKOCYTESUR SMALL (A) 04/09/2022 1140   Sepsis Labs: Invalid input(s): "PROCALCITONIN", "LACTICIDVEN"  Microbiology: Recent Results (from the past 240 hour(s))  Blood Culture (routine x 2)     Status: None   Collection Time: 04/09/22 11:55 AM   Specimen: BLOOD  Result Value Ref Range Status   Specimen Description   Final    BLOOD RIGHT ANTECUBITAL Performed at [facility], [address] [place], [place] [address]    Special Requests   Final    BOTTLES DRAWN AEROBIC AND ANAEROBIC Blood Culture adequate volume Performed at [facility], [address] [place], [place] [address]    Culture   Final    NO GROWTH 5 DAYS Performed at [facility], [address] [place], [place] [address]    Report Status 04/14/2022 FINAL  Final  Blood Culture (routine x 2)     Status: None   Collection  Time: 04/09/22 12:00 PM   Specimen: BLOOD  Result Value Ref Range Status   Specimen Description   Final    BLOOD BLOOD LEFT HAND Performed at [facility], [address], [place], [place] [address]    Special Requests   Final    BOTTLES DRAWN AEROBIC ONLY Blood Culture results may not be optimal due to an inadequate volume of blood received in culture bottles Performed at [facility], [address], [place], [place] [address]    Culture   Final    NO GROWTH 5 DAYS Performed at [facility], [address], [place], [place] [address]    Report Status 04/14/2022 FINAL  Final  C Difficile Quick Screen w PCR reflex     Status: None   Collection Time: 04/09/22  6:07 PM   Specimen: STOOL  Result Value Ref Range Status   C Diff antigen NEGATIVE NEGATIVE Final   C Diff toxin NEGATIVE NEGATIVE Final   C Diff interpretation No C. difficile detected.  Final    Comment: Performed at [facility], [address], [place], [place] [address]  Gastrointestinal Panel by PCR , Stool     Status: None   Collection Time: 04/09/22  6:07 PM   Specimen: STOOL  Result Value Ref Range Status   Campylobacter species NOT DETECTED NOT DETECTED Final   Plesimonas shigelloides NOT DETECTED NOT DETECTED Final   Salmonella species NOT DETECTED NOT DETECTED Final   Yersinia enterocolitica NOT DETECTED NOT DETECTED Final   Vibrio species NOT DETECTED NOT DETECTED Final   Vibrio cholerae NOT DETECTED NOT DETECTED Final   Enteroaggregative E coli (EAEC) NOT DETECTED NOT DETECTED Final   Enteropathogenic E coli (EPEC) NOT DETECTED NOT DETECTED Final   Enterotoxigenic E coli (ETEC) NOT DETECTED NOT DETECTED Final   Shiga like toxin producing E coli (STEC) NOT DETECTED NOT DETECTED Final   Shigella/Enteroinvasive E coli (EIEC) NOT DETECTED NOT DETECTED Final   Cryptosporidium NOT DETECTED NOT DETECTED Final   Cyclospora cayetanensis NOT DETECTED NOT DETECTED Final   Entamoeba histolytica NOT DETECTED NOT DETECTED Final   Giardia lamblia NOT DETECTED NOT DETECTED Final   Adenovirus F40/41 NOT DETECTED NOT  DETECTED Final   Astrovirus NOT DETECTED NOT DETECTED Final   Norovirus GI/GII NOT DETECTED NOT DETECTED Final   Rotavirus A NOT DETECTED NOT DETECTED Final   Sapovirus (I, II, IV, and V) NOT DETECTED NOT DETECTED Final    Comment: Performed at [facility], [address] [place], [place] [address]  MRSA Next Gen by PCR, Nasal     Status: None   Collection Time: 04/09/22  6:28 PM   Specimen: Nasal Mucosa; Nasal Swab  Result Value Ref Range Status   MRSA by PCR Next Gen NOT DETECTED NOT DETECTED Final    Comment: (NOTE) The GeneXpert MRSA Assay (FDA approved for NASAL specimens only), is one component of a comprehensive MRSA colonization surveillance program. It is not intended to diagnose MRSA infection nor to guide or monitor treatment for MRSA infections. Test performance is not FDA approved in patients less than 49 years old. Performed at [facility], [address] [place], [place] [address]     Radiology Studies: No results found.    Gricel Copen T. Giordana Weinheimer [facility]  If 7PM-7AM, please contact night-coverage [email or web address] 04/15/2022, 2:14 PM

## 2022-04-15 NOTE — Progress Notes (Signed)
Bladder scan was done at 0000, urine retented was 380ml. I&O catheter was done, got out of 56ml yellow, clear urine. Patient feels better and no injury issue during the procedure. Peri-care was performed as well.

## 2022-04-15 NOTE — Progress Notes (Signed)
Occupational Therapy Treatment Patient Details Name: Gloria [name] MRN: [id number] DOB: [birth year] Today's Date: 04/15/2022   History of present illness 88 year old F admitted 04/09/22 with weakness and diarrhea for 4 to 5 days. Dx with elevated troponin, severe sepsis, severe AKI, hypotension, severe dehydration PMH includes HTN, AAA, heart murmur, GERD and osteoporosis   OT comments  Patient was found seated in the bedside chair; her niece was present during the session. Functional tranfer training was implemented, as such is warranted to prep the pt for progressive participation in out of bed ADLs. She required max assist to stand using a RW, with max cues needed or pushing up from the chair, best B LE placement on the floor, and trunk extension in standing. She performed 3 total stands, requiring a seated rest break between stands. She was unable to perform dynamic activities, as the intent was to assist her to the bedside commode for toielting, however she was limited by poor standing balance and decrease standing tolerance.      Recommendations for follow up therapy are one component of a multi-disciplinary discharge planning process, led by the attending physician.  Recommendations may be updated based on patient status, additional functional criteria and insurance authorization.    Follow Up Recommendations  Skilled nursing-short term rehab (<3 hours/day)    Assistance Recommended at Discharge Frequent or constant Supervision/Assistance  Patient can return home with the following  Two people to help with walking and/or transfers;A lot of help with bathing/dressing/bathroom;Assistance with cooking/housework;Direct supervision/assist for medications management;Direct supervision/assist for financial management;Assist for transportation;Help with stairs or ramp for entrance         Precautions / Restrictions Precautions Precautions: Fall Restrictions Weight Bearing Restrictions: No        Mobility       Transfers Overall transfer level: Needs assistance Equipment used: Rolling walker (2 wheels) Transfers: Sit to/from Stand Sit to Stand: Max assist           General transfer comment: Functional tranfer training was implemented, as such is warranted to prep the pt for progressive participation in out of bed ADLs. She required max assist to stand using a RW, with max cues needed or pushing up from the chair, best B LE placement on the floor, and trunk extension in standing. She performed 3 total stands, requiring a seated rest break between stands. She was unable to perform dynamic activities, due to poor standing balance and decrease standing tolerance.          ADL either performed or assessed with clinical judgement   ADL   Eating/Feeding: Set up;Sitting   Grooming: Set up;Sitting Grooming Details (indicate cue type and reason): chair level     Lower Body Bathing: Maximal assistance               Toileting- Clothing Manipulation and Hygiene: Maximal assistance;Sit to/from stand Toileting - Clothing Manipulation Details (indicate cue type and reason): Pt required assist for posterior hygiene/clean-up in standing, while using a RW for support in standing; she required cues for trunk extension in standing                       Cognition Arousal/Alertness: Awake/alert     Area of Impairment: Following commands, Problem solving             Following Commands: Follows one step commands with increased time     Problem Solving: Slow processing, Decreased initiation, Requires verbal cues  Pertinent Vitals/ Pain       Pain Assessment Pain Assessment: No/denies pain         Frequency  Min 2X/week        Progress Toward Goals  OT Goals(current goals can now be found in the care plan section)  Progress towards OT goals: Progressing toward goals  Acute Rehab OT Goals OT Goal Formulation: With  patient Time For Goal Achievement: 04/25/22 Potential to Achieve Goals: Fair  Plan Discharge plan remains appropriate       AM-PAC OT "6 Clicks" Daily Activity     Outcome Measure   Help from another person eating meals?: A Little Help from another person taking care of personal grooming?: A Little Help from another person toileting, which includes using toliet, bedpan, or urinal?: A Lot Help from another person bathing (including washing, rinsing, drying)?: A Lot Help from another person to put on and taking off regular upper body clothing?: A Lot Help from another person to put on and taking off regular lower body clothing?: A Lot 6 Click Score: 14    End of Session Equipment Utilized During Treatment: Gait belt;Rolling walker (2 wheels)  OT Visit Diagnosis: Unsteadiness on feet (R26.81);Muscle weakness (generalized) (M62.81)   Activity Tolerance  (Fair overall tolerance)   Patient Left with call bell/phone within reach;with chair alarm set;in chair;with family/visitor present   Nurse Communication Mobility status        Time: 2122-4825 OT Time Calculation (min): 33 min  Charges: OT Evaluation $OT Eval Low Complexity: 1 Low OT Treatments $Therapeutic Activity: 23-37 mins    Reuben Likes, OTR/L 04/15/2022, 4:47 PM

## 2022-04-15 NOTE — Progress Notes (Signed)
Physical Therapy Treatment Patient Details Name: Gloria [name] MRN: [id number] DOB: Jan 31, 1927 Today's Date: 04/15/2022   History of Present Illness 88 year old F admitted 04/09/22 with weakness and diarrhea for 4 to 5 days. Dx with elevated troponin, severe sepsis, severe AKI, hypotension, severe dehydration PMH includes HTN, AAA, heart murmur, GERD and osteoporosis    PT Comments    General Comments: AxO x 3 very pleasant Lady and following all commands. Asissted OOB required + 2 assist and increased time.  General bed mobility comments: pt required Max Asisst to transfer from supine to seated EOB using bed pad to complete scooting.  Once upright, pt was able to static sit at [facility]. General transfer comment: first assisted from bed to recliner via "Bear Hug" as pt was able to stand and support her weight however had difficulty weight shifting/side stepping 1/4 turn to recliner.  Pt was found to be incont of BM so called NT and asisst on/off BSC + 2 assist.  Pt was able to stand briefly with walker but was limited by weakness/fatigue. Positioned in recliner to comfort with multiple pillows.  Pt will need ST Rehab at SNF to address mobility and functional decline prior to safely returning home.    Recommendations for follow up therapy are one component of a multi-disciplinary discharge planning process, led by the attending physician.  Recommendations may be updated based on patient status, additional functional criteria and insurance authorization.  Follow Up Recommendations  Skilled nursing-short term rehab (<3 hours/day) Can patient physically be transported by private vehicle: No   Assistance Recommended at Discharge Frequent or constant Supervision/Assistance  Patient can return home with the following Two people to help with walking and/or transfers;Two people to help with bathing/dressing/bathroom;Assistance with cooking/housework;Assist for transportation;Help with stairs or  ramp for entrance   Equipment Recommendations       Recommendations for Other Services       Precautions / Restrictions Precautions Precautions: Fall Restrictions Weight Bearing Restrictions: No     Mobility  Bed Mobility Overal bed mobility: Needs Assistance Bed Mobility: Supine to Sit     Supine to sit: Max assist     General bed mobility comments: pt required Max Asisst to transfer from supine to seated EOB using bed pad to complete scooting.  Once upright, pt was able to static sit at [facility].    Transfers Overall transfer level: Needs assistance   Transfers: Bed to chair/wheelchair/BSC   Stand pivot transfers: Max assist, +2 physical assistance, +2 safety/equipment, From elevated surface         General transfer comment: first assisted from bed to recliner via "Bear Hug" as pt was able to stand and support her weight however had difficulty weight shifting/side stepping 1/4 turn to recliner.  Pt was found to be incont of BM so called NT and asisst on/off BSC + 2 assist.  Pt was able to stand briefly with walker but was limited by weakness/fatigue.    Ambulation/Gait               General Gait Details: Transfers only this session from bed to [facility] then from [facility] to recliner needing + 2 asisst for safety.   Stairs             Wheelchair Mobility    Modified Rankin (Stroke Patients Only)       Balance  Cognition Arousal/Alertness: Awake/alert Behavior During Therapy: WFL for tasks assessed/performed Overall Cognitive Status: Within Functional Limits for tasks assessed                                 General Comments: AxO x 3 very pleasant Lady and following all commands.        Exercises      General Comments        Pertinent Vitals/Pain Pain Assessment Pain Assessment: No/denies pain    Home Living                          Prior  Function            PT Goals (current goals can now be found in the care plan section) Progress towards PT goals: Progressing toward goals    Frequency    Min 2X/week      PT Plan Current plan remains appropriate    Co-evaluation              AM-PAC PT "6 Clicks" Mobility   Outcome Measure  Help needed turning from your back to your side while in a flat bed without using bedrails?: A Lot Help needed moving from lying on your back to sitting on the side of a flat bed without using bedrails?: A Lot Help needed moving to and from a bed to a chair (including a wheelchair)?: A Lot   Help needed to walk in hospital room?: Total   6 Click Score: 7    End of Session Equipment Utilized During Treatment: Gait belt Activity Tolerance: Patient tolerated treatment well;Patient limited by fatigue Patient left: in chair;with call bell/phone within reach;with chair alarm set Nurse Communication: Mobility status PT Visit Diagnosis: Muscle weakness (generalized) (M62.81);Difficulty in walking, not elsewhere classified (R26.2)     Time: 0017-4944 PT Time Calculation (min) (ACUTE ONLY): 23 min  Charges:  $Therapeutic Activity: 23-37 mins                     {Gwendola Hornaday  PTA Acute  Rehabilitation Services Office M-F          [phone] Weekend pager [phone]

## 2022-04-15 NOTE — Progress Notes (Signed)
Nutrition Note  RD consulted for poor PO intake and Calorie Count.  Placed order for 48 hour Calorie count, which will be completed 10/9. Initial assessment completed 10/3, Ensure and Banatrol ordered for patient at that time. Since then pt's PO intakes have ranged between 25-100%. Consumed 50% of breakfast this morning.  If new nutrition issues arise, please consult RD.   Clayton Bibles, MS, RD, LDN Inpatient Clinical Dietitian Contact information available via Amion

## 2022-04-15 NOTE — Plan of Care (Signed)

## 2022-04-15 NOTE — TOC Progression Note (Signed)
Transition of Care [facility]) - Progression Note    Patient Details  Name: Gloria [name] MRN: [id number] Date of Birth: 05-19-1927  Transition of Care [facility]) CM/SW [place], [place] Phone Number: 04/15/2022, 9:54 AM  Clinical Narrative:    CSW spoke with pt's nephew who was able to visit facilities for SNF placement and have chose placement at [facility]. CSW contacted [employer] to begin insurance authorization for SNF and EMS transport.    Expected Discharge Plan: [place] Barriers to Discharge: No Barriers Identified  Expected Discharge Plan and Services Expected Discharge Plan: [place] In-house Referral: NA Discharge Planning Services: CM Consult Post Acute Care Choice: [place] Living arrangements for the past 2 months: Single Family Home                 DME Arranged: N/A DME Agency: NA                   Social Determinants of Health (SDOH) Interventions    Readmission Risk Interventions    04/14/2022   11:34 AM 04/13/2022   12:56 PM  Readmission Risk Prevention Plan  Post Dischage Appt Complete Complete  Medication Screening Complete Complete  Transportation Screening Complete Complete

## 2022-04-16 DIAGNOSIS — N179 Acute kidney failure, unspecified: Secondary | ICD-10-CM | POA: Diagnosis not present

## 2022-04-16 DIAGNOSIS — R34 Anuria and oliguria: Secondary | ICD-10-CM | POA: Diagnosis not present

## 2022-04-16 DIAGNOSIS — R7989 Other specified abnormal findings of blood chemistry: Secondary | ICD-10-CM | POA: Diagnosis not present

## 2022-04-16 DIAGNOSIS — I7121 Aneurysm of the ascending aorta, without rupture: Secondary | ICD-10-CM | POA: Diagnosis not present

## 2022-04-16 LAB — CBC
HCT: 30.3 % — ABNORMAL LOW (ref 36.0–46.0)
Hemoglobin: 10.3 g/dL — ABNORMAL LOW (ref 12.0–15.0)
MCH: 30.7 pg (ref 26.0–34.0)
MCHC: 34 g/dL (ref 30.0–36.0)
MCV: 90.4 fL (ref 80.0–100.0)
Platelets: 152 10*3/uL (ref 150–400)
RBC: 3.35 MIL/uL — ABNORMAL LOW (ref 3.87–5.11)
RDW: 13.8 % (ref 11.5–15.5)
WBC: 12.1 10*3/uL — ABNORMAL HIGH (ref 4.0–10.5)
nRBC: 0 % (ref 0.0–0.2)

## 2022-04-16 LAB — RENAL FUNCTION PANEL
Albumin: 2.1 g/dL — ABNORMAL LOW (ref 3.5–5.0)
Anion gap: 10 (ref 5–15)
BUN: 84 mg/dL — ABNORMAL HIGH (ref 8–23)
CO2: 21 mmol/L — ABNORMAL LOW (ref 22–32)
Calcium: 7.9 mg/dL — ABNORMAL LOW (ref 8.9–10.3)
Chloride: 102 mmol/L (ref 98–111)
Creatinine, Ser: 4.68 mg/dL — ABNORMAL HIGH (ref 0.44–1.00)
GFR, Estimated: 8 mL/min — ABNORMAL LOW (ref >60)
Glucose, Bld: 184 mg/dL — ABNORMAL HIGH (ref 70–99)
Phosphorus: 3.3 mg/dL (ref 2.5–4.6)
Potassium: 3.1 mmol/L — ABNORMAL LOW (ref 3.5–5.1)
Sodium: 133 mmol/L — ABNORMAL LOW (ref 135–145)

## 2022-04-16 LAB — MAGNESIUM: Magnesium: 1.8 mg/dL (ref 1.7–2.4)

## 2022-04-16 MED ORDER — SODIUM CHLORIDE 0.9 % IV BOLUS
500.0000 mL | Freq: Once | INTRAVENOUS | Status: AC
  Administered 2022-04-16: 500 mL via INTRAVENOUS

## 2022-04-16 MED ORDER — ACETAMINOPHEN 325 MG PO TABS: 650.0000 mg | ORAL_TABLET | Freq: Four times a day (QID) | ORAL | Status: AC | PRN

## 2022-04-16 MED ORDER — ENSURE ENLIVE PO LIQD: 237.0000 mL | ORAL | 12 refills | Status: AC

## 2022-04-16 MED ORDER — SODIUM BICARBONATE 650 MG PO TABS
650.0000 mg | ORAL_TABLET | Freq: Three times a day (TID) | ORAL | Status: AC
  Administered 2022-04-16 (×3): 650 mg via ORAL
  Filled 2022-04-16 (×3): qty 1

## 2022-04-16 MED ORDER — SENNOSIDES-DOCUSATE SODIUM 8.6-50 MG PO TABS: 1.0000 | ORAL_TABLET | Freq: Two times a day (BID) | ORAL | Status: AC | PRN

## 2022-04-16 MED ORDER — POLYETHYLENE GLYCOL 3350 17 G PO PACK: 17.0000 g | PACK | Freq: Two times a day (BID) | ORAL | 0 refills | Status: AC | PRN

## 2022-04-16 MED ORDER — BANATROL TF EN LIQD: 60.0000 mL | Freq: Two times a day (BID) | ENTERAL | Status: AC

## 2022-04-16 MED ORDER — METOPROLOL TARTRATE 12.5 MG HALF TABLET
12.5000 mg | ORAL_TABLET | Freq: Two times a day (BID) | ORAL | Status: DC
  Administered 2022-04-16 – 2022-04-17 (×3): 12.5 mg via ORAL
  Filled 2022-04-16 (×3): qty 1

## 2022-04-16 MED ORDER — POTASSIUM CHLORIDE CRYS ER 20 MEQ PO TBCR
40.0000 meq | EXTENDED_RELEASE_TABLET | ORAL | Status: AC
  Administered 2022-04-16 (×2): 40 meq via ORAL
  Filled 2022-04-16 (×2): qty 2

## 2022-04-16 MED ORDER — METOPROLOL TARTRATE 5 MG/5ML IV SOLN: 2.5000 mg | INTRAVENOUS | Status: DC | PRN

## 2022-04-16 MED ORDER — GERHARDT'S BUTT CREAM: 1.0000 | TOPICAL_CREAM | Freq: Two times a day (BID) | CUTANEOUS | Status: DC

## 2022-04-16 NOTE — Progress Notes (Signed)
Pt is on calorie count which will be completed on 10/9, this morning she ate 50% of yogurt, 1/2 cup of apple sauce, 25% of oatmeal with 25% of milk. Afternoon Pt refused to eat lunch.

## 2022-04-16 NOTE — TOC Progression Note (Addendum)
Transition of Care [facility]) - Progression Note    Patient Details  Name: Gloria [name] MRN: [id number] Date of Birth: 1926-07-23  Transition of Care [facility]) CM/SW Contact  Ross Ludwig, [place] Phone Number: 04/16/2022, 10:17 AM  Clinical Narrative:     Patient has been approved by [employer] for SNF placement at [facility], and EMS transport has also been approved.  SNF approval is valid for 7 days, [id number], EMS  approval is [id number] valid for 90 days.  CSW contacted Ebony Hail at [facility] she said they can accept patient once she is ready for discharge.  CSW updated attending physician.   Expected Discharge Plan: [place] Barriers to Discharge: No Barriers Identified  Expected Discharge Plan and Services Expected Discharge Plan: [place] In-house Referral: NA Discharge Planning Services: CM Consult Post Acute Care Choice: [place] Living arrangements for the past 2 months: Single Family Home                 DME Arranged: N/A DME Agency: NA                   Social Determinants of Health (SDOH) Interventions    Readmission Risk Interventions    04/14/2022   11:34 AM 04/13/2022   12:56 PM  Readmission Risk Prevention Plan  Post Dischage Appt Complete Complete  Medication Screening Complete Complete  Transportation Screening Complete Complete

## 2022-04-16 NOTE — Progress Notes (Signed)
PROGRESS NOTE  Gloria [name] M Gloria [name]:[id number]:[id number] DOB: Oct 01, 1926   PCP: Gloria [name], Gloria W, MD  Patient is from: Home.  Lives alone.  Very independent and active at baseline.  DOA: 04/09/2022 LOS: 7  Chief complaints Chief Complaint  Patient presents with   Hypotension   Diarrhea     Brief Narrative / Interim history: 88 year old F with PMH of HTN, AAA, heart murmur, GERD and osteoporosis brought to ED by EMS due to weakness and diarrhea for 4 to 5 days.  She was hypotensive to 70s when EMS arrived but improved to 80s after 500 cc bolus.  She had significant leukocytosis with lactic acidosis raising concern for infection.  She is also in severe AKI with azotemia.  CT abdomen and pelvis suggests enteritis, stercolitis, moderate to large stool burden, 2.4 cm fluid density in right pelvis, biliary sludge and lingula bronchiectasis with interstitial marking.  She also have markedly elevated troponin to 19,000 that has improved to 17,000.  She had no cardiopulmonary symptoms or EKG findings to suggest ACS.  CK was elevated to 6000 as well.  UA does not suggest UTI.  She was admitted with working diagnosis of non-STEMI, hypotension, severe dehydration, AKI.  She was started on rigorous IV fluid, broad-spectrum antibiotics and admitted.    Started on Levophed peripherally the night of admission due to hypotension.  Blood culture, C. difficile, GIP, MRSA PCR screen negative.  TTE without significant finding.  Renal function worse after interval improvement.  Nephrology and PCCM consulted.  Infectious work-up negative except for CT finding of possible enteritis/stercolitis.  She came off Levophed.  AKI started to improve, and she started to make urine.  Antibiotic de-escalated to IV Unasyn, and she completed the course on 10/7.  Cardiology and PCCM signed off.  Nephrology commended outpatient follow-up and signed off but creatinine continued to trend up likely from IV Lasix.  She also developed intermittent  SVT.  Creatinine seems to improve with intermittent IV fluid.  Started on low-dose metoprolol for SVT.   Likely discharge to SNF on 10/8 if creatinine is stable or continues to improve.  Subjective: Seen and examined earlier this morning.  No major events overnight of this morning.  Patient has no complaints.  She denies pain, shortness of breath, GI or UTI symptoms.  Excellent urine output, 1.5 L in the last 24 hours.  Creatinine and azotemia improved as well.  Objective: Vitals:   04/15/22 1947 04/16/22 0433 04/16/22 0500 04/16/22 1400  BP: 134/71 138/84  134/72  Pulse: 92 87  79  Resp: 16 18  18   Temp: 97.9 F (36.6 C) 97.9 F (36.6 C)  97.7 F (36.5 C)  TempSrc:  Oral  Oral  SpO2: 97% 97%  98%  Weight:   68.7 kg   Height:        Examination:  GENERAL: Sitting in bed.  No apparent distress.  HEENT: MMM.  Vision and hearing grossly intact.  NECK: Supple.  No apparent JVD.  RESP:  No IWOB.  Fair aeration bilaterally. CVS:  RRR.  2/6 SEM over LUSB and LLSB heart sounds normal.  ABD/GI/GU: BS+. Abd soft, NTND.  MSK/EXT:  Moves extremities.  Some edema in LUE with ecchymosis SKIN: As above. NEURO: Awake and alert. Oriented fairly.  No apparent focal neuro deficit. PSYCH: Calm. Normal affect.   Procedures:  None  Microbiology summarized: MRSA PCR screen negative. C. difficile negative. Blood cultures NGTD GIP negative.  Assessment and plan: Principal Problem:   Elevated  troponin Active Problems:   Essential hypertension   GERD   Heart murmur   Ascending aortic aneurysm (HCC)   AKI (acute kidney injury) (HCC)   Azotemia   Generalized weakness   Bandemia   Hypokalemia   Elevated AST (SGOT)   Diarrhea   Enteritis   Constipation   Stercoral colitis   Dehydration   Hypovolemic shock (HCC)   Lactic acidosis   Rhabdomyolysis   Anuria   NSTEMI (non-ST elevated myocardial infarction) (HCC)   Shock (HCC)  Markedly elevated troponin/possible non-STEMI: No  cardiopulmonary symptoms or signs of ischemia on EKG. She is also severely dehydrated with severe AKI which might be contributing.  Also concern for infectious process but no clear source other than some enteritis.  TTE with LVEF of 70 to 75%, G1 DD and moderate AVS and mitral chordal S.A.M with turbulent LVOT flow.  -Cardiology signed off.  Intermittent SVT: Due to dehydration?  EKG read as A-fib with RVR on 10/5 but she has P waves.  Able to break SVT with push Cardizem, IV fluid and as needed metoprolol. -Start metoprolol 12.5 mg twice daily -IV metoprolol 2.5 mg as needed sustained HR > 120 with hold parameters -Encourage oral hydration.  NS bolus 500 cc x 1 -Hold off further diuretics given poor p.o. intake   AKI/azotemia: Likely prerenal in the setting of GI loss and concurrent use of ACE inhibitors and HCTZ.  No signs of obstruction on CT. CK 6000>>> 124.  Renal US suggested CKD.  About 1.6 L UOP/24 hours.  Net +6.7 L. Recent Labs    11/16/21 0903 04/09/22 1212 04/09/22 1932 04/10/22 0309 04/11/22 0553 04/12/22 0245 04/13/22 0600 04/14/22 0524 04/15/22 0608 04/16/22 0559  BUN 14 109* 100* 93* 94* 84* 86* 88* 91* 84*  CREATININE 0.75 4.46* 4.21* 4.06* 4.71* 4.36* 4.66* 4.67* 5.04* 4.68*  -Nephrology signed off.  They will arrange outpatient follow-up -Continue holding home lisinopril/HCTZ.  Discontinue on discharge -Continue intermittent boluses -Strict intake and output -Bladder scan as needed   Severe dehydration/hypovolemic shock: Resolved. -PCCM signed off. -Continue low-dose midodrine for blood pressure support   Diarrhea/enteritis/possible stercolitis/constipation: C. difficile and GIP negative.  Abdominal exam benign.  Having diarrhea likely from scheduled bowel regimen.  KUB without fecal impaction but few slightly dilated small bowel loops suggesting ileus -Bowel regimen as needed. -Completed antibiotic course on 10/7. -Mobilize as  able.  Leukocytosis/bandemia/lactic acidosis: Likely due to dehydration, AKI and intra-abdominal infection.  Diarrhea and CT findings suggest enteritis and stercolitis but abdominal exam benign.  No respiratory or UTI symptoms.  C. difficile and GIP negative.  Lactic acidosis resolved.  Leukocytosis improved -Completed antibiotic course on 10/7  Nontraumatic rhabdomyolysis/elevated AST: LFT improved.  Rhabdomyolysis resolved.   Abnormal CT-some concern about lung nodules in RUL and 2.4 cm fluid density in pelvis suggesting ovarian cyst or an artifact. -Given advanced age, doubt the necessity of outpatient follow-up on this   Generalized weakness-in the setting of dehydration, diarrhea, AKI, azotemia... Patient is very independent and active at baseline.  Now requiring 2 person assist -PT/OT recommended SNF.   Heart murmur/moderate aortic valve stenosis/dynamic LVOT obstruction: TTE as above.   Goal of care discussion: Discussed CODE STATUS including pros and cons of CPR and intubation.  Patient prefers to be DNR.  Nephew, HCPOA in agreement.  Hypokalemia/hypophosphatemia/hyponatremia:  -P.o. KCl 40x2  Increased nutrient needs Body mass index is 26.83 kg/m. -Consult dietitian   Pressure skin injury: POA Pressure Injury 04/09/22 Buttocks Right Deep  Tissue Pressure Injury - Purple or maroon localized area of discolored intact skin or blood-filled blister due to damage of underlying soft tissue from pressure and/or shear. 6X2.5cm (Active)  04/09/22 1815  Location: Buttocks  Location Orientation: Right  Staging: Deep Tissue Pressure Injury - Purple or maroon localized area of discolored intact skin or blood-filled blister due to damage of underlying soft tissue from pressure and/or shear.  Wound Description (Comments): 6X2.5cm  Present on Admission: Yes  Dressing Type Foam - Lift dressing to assess site every shift 04/16/22 0800   DVT prophylaxis:  heparin injection 5,000 Units Start:  04/10/22 1415    Code Status: DNR/DNI Family Communication: Updated patient's nephew, Lanny Hurst over the phone Level of care: Telemetry Status is: Inpatient Remains inpatient appropriate because: AKI, intermittent SVT and physical deconditioning   Final disposition: SNF Consultants:  Pulmonology-signed off Cardiology-signed off Nephrology-signed off  Sch Meds:  Scheduled Meds:  feeding supplement  237 mL Oral Q24H   fiber supplement (BANATROL TF)  60 mL Oral BID   Gerhardt's butt cream   Topical BID   heparin injection (subcutaneous)  5,000 Units Subcutaneous Q8H   metoprolol tartrate  12.5 mg Oral BID   midodrine  5 mg Oral TID WC   sodium bicarbonate  650 mg Oral TID   Continuous Infusions:  sodium chloride Stopped (04/12/22 0746)   PRN Meds:.acetaminophen **OR** acetaminophen, lip balm, metoprolol tartrate, ondansetron **OR** ondansetron (ZOFRAN) IV, mouth rinse, polyethylene glycol, senna-docusate  Antimicrobials: Anti-infectives (From admission, onward)    Start     Dose/Rate Route Frequency Ordered Stop   04/12/22 1200  Ampicillin-Sulbactam (UNASYN) 3 g in sodium chloride 0.9 % 100 mL IVPB        3 g 200 mL/hr over 30 Minutes Intravenous Every 24 hours 04/12/22 0844 04/16/22 0052   04/10/22 1500  ceFEPIme (MAXIPIME) 1 g in sodium chloride 0.9 % 100 mL IVPB  Status:  Discontinued        1 g 200 mL/hr over 30 Minutes Intravenous Every 24 hours 04/09/22 1711 04/12/22 0815   04/10/22 0200  metroNIDAZOLE (FLAGYL) IVPB 500 mg  Status:  Discontinued        500 mg 100 mL/hr over 60 Minutes Intravenous Every 12 hours 04/09/22 1702 04/12/22 0815   04/09/22 1710  vancomycin variable dose per unstable renal function (pharmacist dosing)  Status:  Discontinued         Does not apply See admin instructions 04/09/22 1710 04/11/22 0737   04/09/22 1415  ceFEPIme (MAXIPIME) 2 g in sodium chloride 0.9 % 100 mL IVPB        2 g 200 mL/hr over 30 Minutes Intravenous  Once 04/09/22 1408  04/09/22 1451   04/09/22 1415  metroNIDAZOLE (FLAGYL) IVPB 500 mg        500 mg 100 mL/hr over 60 Minutes Intravenous  Once 04/09/22 1408 04/09/22 1559   04/09/22 1415  vancomycin (VANCOCIN) IVPB 1000 mg/200 mL premix        1,000 mg 200 mL/hr over 60 Minutes Intravenous  Once 04/09/22 1408 04/09/22 1659        I have personally reviewed the following labs and images: CBC: Recent Labs  Lab 04/12/22 0245 04/13/22 0600 04/14/22 0524 04/15/22 0608 04/16/22 0559  WBC 17.1* 12.8* 11.1* 11.9* 12.1*  HGB 10.5* 11.0* 11.1* 10.7* 10.3*  HCT 30.2* 31.3* 31.9* 31.5* 30.3*  MCV 89.1 88.2 88.9 92.1 90.4  PLT 121* 128* 137* 141* 152   BMP &GFR Recent Labs  Lab 04/12/22 0245 04/13/22 0600 04/14/22 0524 04/15/22 0608 04/16/22 0559  NA 130* 131* 134* 134* 133*  K 3.7 3.5 3.1* 3.6 3.1*  CL 101 98 100 100 102  CO2 22 20* 22 23 21*  GLUCOSE 101* 101* 126* 108* 184*  BUN 84* 86* 88* 91* 84*  CREATININE 4.36* 4.66* 4.67* 5.04* 4.68*  CALCIUM 7.2* 7.4* 7.8* 7.9* 7.9*  MG 1.6* 2.0 2.0 2.0 1.8  PHOS 1.8* 3.9 3.6 3.5 3.3   Estimated Creatinine Clearance: 7 mL/min (A) (by C-G formula based on SCr of 4.68 mg/dL (H)). Liver & Pancreas: Recent Labs  Lab 04/10/22 0309 04/11/22 0553 04/12/22 0245 04/13/22 0600 04/14/22 0524 04/15/22 0608 04/16/22 0559  AST 137* 94* 93* 92* 66*  --   --   ALT 41 41 46* 55* 52*  --   --   ALKPHOS 86 78 70 72 74  --   --   BILITOT 1.9* 1.4* 0.9 0.8 0.8  --   --   PROT 5.1* 4.7* 4.4* 4.5* 4.9*  --   --   ALBUMIN 2.4* 2.2* 2.0* 2.0* 2.1* 2.2* 2.1*   No results for input(s): "LIPASE", "AMYLASE" in the last 168 hours. No results for input(s): "AMMONIA" in the last 168 hours. Diabetic: No results for input(s): "HGBA1C" in the last 72 hours. No results for input(s): "GLUCAP" in the last 168 hours. Cardiac Enzymes: Recent Labs  Lab 04/10/22 0309 04/11/22 0553 04/12/22 0245 04/13/22 0600 04/14/22 0524  CKTOTAL 612* 1,464* 538* 253* 124   No  results for input(s): "PROBNP" in the last 8760 hours. Coagulation Profile: No results for input(s): "INR", "PROTIME" in the last 168 hours.  Thyroid Function Tests: No results for input(s): "TSH", "T4TOTAL", "FREET4", "T3FREE", "THYROIDAB" in the last 72 hours. Lipid Profile: No results for input(s): "CHOL", "HDL", "LDLCALC", "TRIG", "CHOLHDL", "LDLDIRECT" in the last 72 hours. Anemia Panel: No results for input(s): "VITAMINB12", "FOLATE", "FERRITIN", "TIBC", "IRON", "RETICCTPCT" in the last 72 hours. Urine analysis:    Component Value Date/Time   COLORURINE AMBER (A) 04/09/2022 1140   APPEARANCEUR CLOUDY (A) 04/09/2022 1140   LABSPEC 1.016 04/09/2022 1140   PHURINE 5.0 04/09/2022 1140   GLUCOSEU 50 (A) 04/09/2022 1140   GLUCOSEU NEGATIVE 11/16/2021 0903   HGBUR MODERATE (A) 04/09/2022 1140   BILIRUBINUR NEGATIVE 04/09/2022 1140   KETONESUR NEGATIVE 04/09/2022 1140   PROTEINUR 100 (A) 04/09/2022 1140   UROBILINOGEN 0.2 11/16/2021 0903   NITRITE NEGATIVE 04/09/2022 1140   LEUKOCYTESUR SMALL (A) 04/09/2022 1140   Sepsis Labs: Invalid input(s): "PROCALCITONIN", "LACTICIDVEN"  Microbiology: Recent Results (from the past 240 hour(s))  Blood Culture (routine x 2)     Status: None   Collection Time: 04/09/22 11:55 AM   Specimen: BLOOD  Result Value Ref Range Status   Specimen Description   Final    BLOOD RIGHT ANTECUBITAL Performed at [facility], [name], [place], [place] [address]    Special Requests   Final    BOTTLES DRAWN AEROBIC AND ANAEROBIC Blood Culture adequate volume Performed at [facility], [name], [place], [place] [address]    Culture   Final    NO GROWTH 5 DAYS Performed at [facility], [address], [place], [place] [address]    Report Status 04/14/2022 FINAL  Final  Blood Culture (routine x 2)     Status: None   Collection Time: 04/09/22 12:00 PM   Specimen: BLOOD  Result Value Ref Range  Status   Specimen Description   Final    BLOOD BLOOD LEFT HAND Performed at [facility], [name], [place], [place] [address]    Special Requests   Final    BOTTLES DRAWN AEROBIC ONLY Blood Culture results may not be optimal due to an inadequate volume of blood received in culture bottles Performed at [facility], [name], [place], [place] [address]    Culture   Final    NO GROWTH 5 DAYS Performed at [facility], [address], [place], [place] [address]    Report Status 04/14/2022 FINAL  Final  C Difficile Quick Screen [name] PCR reflex     Status: None   Collection Time: 04/09/22  6:07 PM   Specimen: STOOL  Result Value Ref Range Status   C Diff antigen NEGATIVE NEGATIVE Final   C Diff toxin NEGATIVE NEGATIVE Final   C Diff interpretation No C. difficile detected.  Final    Comment: Performed at [facility], [name] [place], [place] [address]  Gastrointestinal Panel by PCR , Stool     Status: None   Collection Time: 04/09/22  6:07 PM   Specimen: STOOL  Result Value Ref Range Status   Campylobacter species NOT DETECTED NOT DETECTED Final   Plesimonas shigelloides NOT DETECTED NOT DETECTED Final   Salmonella species NOT DETECTED NOT DETECTED Final   Yersinia enterocolitica NOT DETECTED NOT DETECTED Final   Vibrio species NOT DETECTED NOT DETECTED Final   Vibrio cholerae NOT DETECTED NOT DETECTED Final   Enteroaggregative E coli (EAEC) NOT DETECTED NOT DETECTED Final   Enteropathogenic E coli (EPEC) NOT DETECTED NOT DETECTED Final   Enterotoxigenic E coli (ETEC) NOT DETECTED NOT DETECTED Final   Shiga like toxin producing E coli (STEC) NOT DETECTED NOT DETECTED Final   Shigella/Enteroinvasive E coli (EIEC) NOT DETECTED NOT DETECTED Final   Cryptosporidium NOT DETECTED NOT DETECTED Final   Cyclospora cayetanensis NOT DETECTED NOT DETECTED Final   Entamoeba histolytica NOT DETECTED NOT DETECTED  Final   Giardia lamblia NOT DETECTED NOT DETECTED Final   Adenovirus F40/41 NOT DETECTED NOT DETECTED Final   Astrovirus NOT DETECTED NOT DETECTED Final   Norovirus GI/GII NOT DETECTED NOT DETECTED Final   Rotavirus A NOT DETECTED NOT DETECTED Final   Sapovirus (I, II, IV, and V) NOT DETECTED NOT DETECTED Final    Comment: Performed at [facility], [address] [place], [place] [address]  MRSA Next Gen by PCR, Nasal     Status: None   Collection Time: 04/09/22  6:28 PM   Specimen: Nasal Mucosa; Nasal Swab  Result Value Ref Range Status   MRSA by PCR Next Gen NOT DETECTED NOT DETECTED Final    Comment: (NOTE) The GeneXpert MRSA Assay (FDA approved for NASAL specimens only), is one component of a comprehensive MRSA colonization surveillance program. It is not intended to diagnose MRSA infection nor to guide or monitor treatment for MRSA infections. Test performance is not FDA approved in patients less than 20 years old. Performed at [facility], [name], [place], [place] [address]     Radiology Studies: US RENAL  Result Date: 04/15/2022 CLINICAL DATA:  Renal dysfunction EXAM: RENAL / URINARY TRACT ULTRASOUND COMPLETE COMPARISON:  CT done on 04/09/2022 FINDINGS: Right Kidney: Renal measurements: 10.7 x 5 x 4.4 cm = volume: 122.2 mL. There is no hydronephrosis. There is increased cortical echogenicity. There is trace  amount of perinephric fluid. Left Kidney: Renal measurements: 11.1 x 4.8 x 4.2 cm = volume: 117.9 mL. There is no hydronephrosis. There is increased cortical echogenicity. Bladder: Appears normal for degree of bladder distention. Other: None. IMPRESSION: There is no hydronephrosis. Increased cortical echogenicity suggests possible medical renal disease. Electronically Signed   By: Ernie Avena M.D.   On: 04/15/2022 20:13      Zidan Helget T. Yetunde Leis [facility]  If 7PM-7AM, please contact night-coverage [email or web address] 04/16/2022,  3:10 PM

## 2022-04-16 NOTE — Plan of Care (Signed)
  Problem: Clinical Measurements: Goal: Diagnostic test results will improve Outcome: Not Progressing   Problem: Nutrition: Goal: Adequate nutrition will be maintained Outcome: Not Progressing   Problem: Elimination: Goal: Will not experience complications related to urinary retention Outcome: Not Progressing   Problem: Skin Integrity: Goal: Risk for impaired skin integrity will decrease Outcome: Not Progressing

## 2022-04-16 NOTE — Hospital Course (Signed)
88 year old F with PMH of HTN, AAA, heart murmur, GERD and osteoporosis brought to ED by EMS due to weakness and diarrhea for 4 to 5 days.  She was hypotensive to 70s when EMS arrived but improved to 80s after 500 cc bolus.  She had significant leukocytosis with lactic acidosis raising concern for infection.  She is also in severe AKI with azotemia.  CT abdomen and pelvis suggests enteritis, stercolitis, moderate to large stool burden, 2.4 cm fluid density in right pelvis, biliary sludge and lingula bronchiectasis with interstitial marking.  She also have markedly elevated troponin to 19,000 that has improved to 17,000.  She had no cardiopulmonary symptoms or EKG findings to suggest ACS.  CK was elevated to 6000 as well.  UA does not suggest UTI.  She was admitted with working diagnosis of non-STEMI, hypotension, severe dehydration, AKI.  She was started on rigorous IV fluid, broad-spectrum antibiotics and admitted.     Started on Levophed peripherally the night of admission due to hypotension.  Blood culture, C. difficile, GIP, MRSA PCR screen negative.  TTE without significant finding.  Renal function worse after interval improvement.  Nephrology and PCCM consulted.   Infectious work-up negative except for CT finding of possible enteritis/stercolitis.  She came off Levophed.  AKI started to improve, and she started to make urine.  Antibiotic de-escalated to IV Unasyn, and she completed the course on 10/7.  Cardiology and PCCM signed off.  Nephrology commended outpatient follow-up and signed off but renal function worse after IV Lasix on 10/5.  She also went into SVT.  IV Lasix discontinued.  She was given IV fluid boluses.  SVT aborted by IV Cardizem push and IV fluid.  Renal function and urine output started to improve. Started on low-dose metoprolol for SVT.    Likely discharge to SNF on 10/8 if renal function is stable or improves.

## 2022-04-16 NOTE — Progress Notes (Signed)
At 2335 Notified by telemetry that patient has converted to afib RVR with rate sustaining at 130-150 bpm, checked on patient and she is awake resting, per patient she feels her heart rate racing, PRN medicine administered as ordered, immediate result noted, heart rate back to 97 bpm.   04/15/22 2337 04/15/22 2339  Assess: MEWS Score  ECG Heart Rate (!) 137 97  Assess: MEWS Score  MEWS Temp 0 0  MEWS Systolic 0 0  MEWS Pulse 3 0  MEWS RR 0 0  MEWS LOC 0 0  MEWS Score 3 0  MEWS Score Color Yellow Green  Assess: if the MEWS score is Yellow or Red  Were vital signs taken at a resting state? Yes  --   Focused Assessment No change from prior assessment  --   Does the patient have a confirmed or suspected source of infection? No  --   Provider and Rapid Response Notified? No  --   Treat  MEWS Interventions Administered prn meds/treatments  --   Document  Patient Outcome  --  Stabilized after interventions  Assess: SIRS CRITERIA  SIRS Temperature  0 0  SIRS Pulse 1 1  SIRS Respirations  0 0  SIRS WBC 1 1  SIRS Score Sum  2 2

## 2022-04-17 DIAGNOSIS — E778 Other disorders of glycoprotein metabolism: Secondary | ICD-10-CM | POA: Diagnosis not present

## 2022-04-17 DIAGNOSIS — K5900 Constipation, unspecified: Secondary | ICD-10-CM | POA: Diagnosis not present

## 2022-04-17 DIAGNOSIS — L89152 Pressure ulcer of sacral region, stage 2: Secondary | ICD-10-CM | POA: Diagnosis not present

## 2022-04-17 DIAGNOSIS — E860 Dehydration: Secondary | ICD-10-CM | POA: Diagnosis not present

## 2022-04-17 DIAGNOSIS — K529 Noninfective gastroenteritis and colitis, unspecified: Secondary | ICD-10-CM | POA: Diagnosis not present

## 2022-04-17 DIAGNOSIS — D72829 Elevated white blood cell count, unspecified: Secondary | ICD-10-CM | POA: Diagnosis not present

## 2022-04-17 DIAGNOSIS — N179 Acute kidney failure, unspecified: Secondary | ICD-10-CM | POA: Diagnosis not present

## 2022-04-17 DIAGNOSIS — R600 Localized edema: Secondary | ICD-10-CM | POA: Diagnosis not present

## 2022-04-17 DIAGNOSIS — D649 Anemia, unspecified: Secondary | ICD-10-CM | POA: Diagnosis not present

## 2022-04-17 DIAGNOSIS — R7989 Other specified abnormal findings of blood chemistry: Secondary | ICD-10-CM | POA: Diagnosis not present

## 2022-04-17 DIAGNOSIS — M2550 Pain in unspecified joint: Secondary | ICD-10-CM | POA: Diagnosis not present

## 2022-04-17 DIAGNOSIS — R5381 Other malaise: Secondary | ICD-10-CM | POA: Diagnosis not present

## 2022-04-17 DIAGNOSIS — M6282 Rhabdomyolysis: Secondary | ICD-10-CM | POA: Diagnosis not present

## 2022-04-17 DIAGNOSIS — I714 Abdominal aortic aneurysm, without rupture, unspecified: Secondary | ICD-10-CM | POA: Diagnosis not present

## 2022-04-17 DIAGNOSIS — K219 Gastro-esophageal reflux disease without esophagitis: Secondary | ICD-10-CM | POA: Diagnosis not present

## 2022-04-17 DIAGNOSIS — M6281 Muscle weakness (generalized): Secondary | ICD-10-CM | POA: Diagnosis not present

## 2022-04-17 DIAGNOSIS — M810 Age-related osteoporosis without current pathological fracture: Secondary | ICD-10-CM | POA: Diagnosis not present

## 2022-04-17 DIAGNOSIS — E872 Acidosis, unspecified: Secondary | ICD-10-CM | POA: Diagnosis not present

## 2022-04-17 DIAGNOSIS — R2689 Other abnormalities of gait and mobility: Secondary | ICD-10-CM | POA: Diagnosis not present

## 2022-04-17 DIAGNOSIS — I10 Essential (primary) hypertension: Secondary | ICD-10-CM | POA: Diagnosis not present

## 2022-04-17 DIAGNOSIS — I214 Non-ST elevation (NSTEMI) myocardial infarction: Secondary | ICD-10-CM | POA: Diagnosis not present

## 2022-04-17 DIAGNOSIS — D72825 Bandemia: Secondary | ICD-10-CM | POA: Diagnosis not present

## 2022-04-17 DIAGNOSIS — I471 Supraventricular tachycardia, unspecified: Secondary | ICD-10-CM | POA: Diagnosis not present

## 2022-04-17 DIAGNOSIS — R34 Anuria and oliguria: Secondary | ICD-10-CM | POA: Diagnosis not present

## 2022-04-17 DIAGNOSIS — Z7401 Bed confinement status: Secondary | ICD-10-CM | POA: Diagnosis not present

## 2022-04-17 LAB — COMPREHENSIVE METABOLIC PANEL
ALT: 26 U/L (ref 0–44)
AST: 21 U/L (ref 15–41)
Albumin: 2.1 g/dL — ABNORMAL LOW (ref 3.5–5.0)
Alkaline Phosphatase: 51 U/L (ref 38–126)
Anion gap: 10 (ref 5–15)
BUN: 77 mg/dL — ABNORMAL HIGH (ref 8–23)
CO2: 20 mmol/L — ABNORMAL LOW (ref 22–32)
Calcium: 8 mg/dL — ABNORMAL LOW (ref 8.9–10.3)
Chloride: 105 mmol/L (ref 98–111)
Creatinine, Ser: 4.32 mg/dL — ABNORMAL HIGH (ref 0.44–1.00)
GFR, Estimated: 9 mL/min — ABNORMAL LOW (ref >60)
Glucose, Bld: 96 mg/dL (ref 70–99)
Potassium: 4.1 mmol/L (ref 3.5–5.1)
Sodium: 135 mmol/L (ref 135–145)
Total Bilirubin: 0.7 mg/dL (ref 0.3–1.2)
Total Protein: 4.9 g/dL — ABNORMAL LOW (ref 6.5–8.1)

## 2022-04-17 LAB — CBC
HCT: 29.7 % — ABNORMAL LOW (ref 36.0–46.0)
Hemoglobin: 9.9 g/dL — ABNORMAL LOW (ref 12.0–15.0)
MCH: 30.7 pg (ref 26.0–34.0)
MCHC: 33.3 g/dL (ref 30.0–36.0)
MCV: 92.2 fL (ref 80.0–100.0)
Platelets: 169 10*3/uL (ref 150–400)
RBC: 3.22 MIL/uL — ABNORMAL LOW (ref 3.87–5.11)
RDW: 14.2 % (ref 11.5–15.5)
WBC: 10.6 10*3/uL — ABNORMAL HIGH (ref 4.0–10.5)
nRBC: 0 % (ref 0.0–0.2)

## 2022-04-17 LAB — MAGNESIUM: Magnesium: 1.7 mg/dL (ref 1.7–2.4)

## 2022-04-17 LAB — PHOSPHORUS: Phosphorus: 3.9 mg/dL (ref 2.5–4.6)

## 2022-04-17 MED ORDER — METOPROLOL TARTRATE 25 MG PO TABS: 12.5000 mg | ORAL_TABLET | Freq: Two times a day (BID) | ORAL | 11 refills | Status: DC

## 2022-04-17 NOTE — Plan of Care (Signed)

## 2022-04-17 NOTE — TOC Transition Note (Signed)
Transition of Care [facility]) - CM/SW Discharge Note   Patient Details  Name: Gloria [name] MRN: [id number] Date of Birth: Aug 25, 1926  [facility] CM/SW Contact:  Vassie Moselle, [place] Phone Number: 04/17/2022, 12:24 PM   Clinical Narrative:    Pt is to transfer to [place]. Pt will be going to room 310 bed 1. RN to call report to [phone]. Pt will be transported to facility via [facility]. Pt's nephew has been notified of discharge plans.    Final next level of care: Skilled Nursing Facility Barriers to Discharge: No Barriers Identified   Patient Goals and CMS Choice Patient states their goals for this hospitalization and ongoing recovery are:: To return home CMS Medicare.gov Compare Post Acute Care list provided to:: Patient Represenative (must comment) (Nephew/HCPOA) Choice offered to / list presented to : [place] / Guardian  Discharge Placement PASRR number recieved: 04/13/22            Patient chooses bed at: [facility] Patient to be transferred to facility by: [facility] Name of family member notified: Norman Herrlich Patient and family notified of of transfer: 04/17/22  Discharge Plan and Services In-house Referral: NA Discharge Planning Services: CM Consult Post Acute Care Choice: [place]          DME Arranged: N/A DME Agency: NA                  Social Determinants of Health ([place]) Interventions     Readmission Risk Interventions    04/17/2022   12:21 PM 04/14/2022   11:34 AM 04/13/2022   12:56 PM  Readmission Risk Prevention Plan  Post Dischage Appt  Complete Complete  Medication Screening  Complete Complete  Transportation Screening Complete Complete Complete  PCP or Specialist Appt within 5-7 Days Complete    Home Care Screening Complete    Medication Review (RN CM) Complete

## 2022-04-17 NOTE — Discharge Summary (Signed)
Physician Discharge Summary   Patient: Gloria [name] MRN: [id number] DOB: 10-27-1926  Admit date:     04/09/2022  Discharge date: 04/17/22  Discharge Physician: Kathlen Mody   PCP: [name], MD   Recommendations at discharge:  Please follow up with PCp in one week.  Please follow up with dietary at SNF.  Please check cbc and bmp in one week.   Discharge Diagnoses: Principal Problem:   Elevated troponin Active Problems:   Essential hypertension   GERD   Heart murmur   Ascending aortic aneurysm (HCC)   AKI (acute kidney injury) (HCC)   Azotemia   Generalized weakness   Bandemia   Hypokalemia   Elevated AST (SGOT)   Diarrhea   Enteritis   Constipation   Stercoral colitis   Dehydration   Hypovolemic shock (HCC)   Lactic acidosis   Rhabdomyolysis   Anuria   NSTEMI (non-ST elevated myocardial infarction) (HCC)   Shock [facility])    Hospital Course: 88 year old F with PMH of HTN, AAA, heart murmur, GERD and osteoporosis brought to ED by EMS due to weakness and diarrhea for 4 to 5 days.  She was hypotensive to 70s when EMS arrived but improved to 80s after 500 cc bolus.  She had significant leukocytosis with lactic acidosis raising concern for infection.  She is also in severe AKI with azotemia.  CT abdomen and pelvis suggests enteritis, stercolitis, moderate to large stool burden, 2.4 cm fluid density in right pelvis, biliary sludge and lingula bronchiectasis with interstitial marking.  She also have markedly elevated troponin to 19,000 that has improved to 17,000.  She had no cardiopulmonary symptoms or EKG findings to suggest ACS.  CK was elevated to 6000 as well.  UA does not suggest UTI.  She was admitted with working diagnosis of non-STEMI, hypotension, severe dehydration, AKI.  She was started on rigorous IV fluid, broad-spectrum antibiotics and admitted.     Started on Levophed peripherally the night of admission due to hypotension.  Blood culture, C. difficile, GIP,  MRSA PCR screen negative.  TTE without significant finding.  Renal function worse after interval improvement.  Nephrology and PCCM consulted.   Infectious work-up negative except for CT finding of possible enteritis/stercolitis.  She came off Levophed.  AKI started to improve, and she started to make urine.  Antibiotic de-escalated to IV Unasyn, and she completed the course on 10/7.  Cardiology and PCCM signed off.  Nephrology commended outpatient follow-up and signed off but renal function worse after IV Lasix on 10/5.  She also went into SVT.  IV Lasix discontinued.  She was given IV fluid boluses.  SVT aborted by IV Cardizem push and IV fluid.  Renal function and urine output started to improve. Started on low-dose metoprolol for SVT.    Likely discharge to SNF on 10/8 if renal function is stable or improves.  Assessment and Plan:  Markedly elevated troponin/possible non-STEMI: No cardiopulmonary symptoms or signs of ischemia on EKG. She is also severely dehydrated with severe AKI which might be contributing.  Also concern for infectious process but no clear source other than some enteritis.  TTE with LVEF of 70 to 75%, G1 DD and moderate AVS and mitral chordal S.A.M with turbulent LVOT flow.  -Cardiology signed off.   Intermittent SVT: Due to dehydration?  EKG read as A-fib with RVR on 10/5 but she has P waves.  Able to break SVT with push Cardizem, IV fluid and as needed metoprolol. -Start metoprolol 12.5  mg twice daily -Encourage oral hydration.     AKI/azotemia: Likely prerenal in the setting of GI loss and concurrent use of ACE inhibitors and HCTZ.  No signs of obstruction on CT. CK 6000>>> 124.  Renal US suggested CKD.  About 1.6 L UOP/24 hours.  Net +6.7 L. -Nephrology signed off.  They will arrange outpatient follow-up -Continue holding home lisinopril/HCTZ.  Discontinue on discharge -Continue intermittent boluses -Strict intake and output -Bladder scan as needed   Severe  dehydration/hypovolemic shock: Resolved. -PCCM signed off. -Continue low-dose midodrine for blood pressure support   Diarrhea/enteritis/possible stercolitis/constipation: C. difficile and GIP negative.  Abdominal exam benign.  Having diarrhea likely from scheduled bowel regimen.  KUB without fecal impaction but few slightly dilated small bowel loops suggesting ileus -Bowel regimen as needed. -Completed antibiotic course on 10/7. -Mobilize as able.   Leukocytosis/bandemia/lactic acidosis: Likely due to dehydration, AKI and intra-abdominal infection.  Diarrhea and CT findings suggest enteritis and stercolitis but abdominal exam benign.  No respiratory or UTI symptoms.  C. difficile and GIP negative.  Lactic acidosis resolved.  Leukocytosis improved -Completed antibiotic course on 10/7   Nontraumatic rhabdomyolysis/elevated AST: LFT improved.  Rhabdomyolysis resolved.   Abnormal CT-some concern about lung nodules in RUL and 2.4 cm fluid density in pelvis suggesting ovarian cyst or an artifact. -Given advanced age, doubt the necessity of outpatient follow-up on this   Generalized weakness-in the setting of dehydration, diarrhea, AKI, azotemia... Patient is very independent and active at baseline.  Now requiring 2 person assist -PT/OT recommended SNF.   Heart murmur/moderate aortic valve stenosis/dynamic LVOT obstruction: TTE as above.   Goal of care discussion: Discussed CODE STATUS including pros and cons of CPR and intubation.  Patient prefers to be DNR.  Nephew, HCPOA in agreement.   Hypokalemia/hypophosphatemia/hyponatremia:  -P.o. KCl 40x2   Increased nutrient needs Body mass index is 26.83 kg/m. -Consult dietitian     Pressure skin injury: POA     Pressure Injury 04/09/22 Buttocks Right Deep Tissue Pressure Injury - Purple or maroon localized area of discolored intact skin or blood-filled blister due to damage of underlying soft tissue from pressure and/or shear. 6X2.5cm (Active)   04/09/22 1815  Location: Buttocks  Location Orientation: Right  Staging: Deep Tissue Pressure Injury - Purple or maroon localized area of discolored intact skin or blood-filled blister due to damage of underlying soft tissue from pressure and/or shear.  Wound Description (Comments): 6X2.5cm  Present on Admission: Yes  Dressing Type Foam - Lift dressing to assess site every shift 04/16/22 0800        Consultants: cardiology  Nephrology.  Procedures performed: none.   Disposition: Skilled nursing facility Diet recommendation:  Discharge Diet Orders (From admission, onward)     Start     Ordered   04/17/22 0000  Diet - low sodium heart healthy        04/17/22 1151           Regular diet DISCHARGE MEDICATION: Allergies as of 04/17/2022   No Known Allergies      Medication List     STOP taking these medications    lisinopril-hydrochlorothiazide 20-12.5 MG tablet Commonly known as: ZESTORETIC   rosuvastatin 40 MG tablet Commonly known as: CRESTOR   Thera-D 2000 50 MCG (2000 UT) Tabs Generic drug: Cholecalciferol       TAKE these medications    acetaminophen 325 MG tablet Commonly known as: TYLENOL Take 2 tablets (650 mg total) by mouth every 6 (six) hours  as needed for mild pain (or Fever >/= 101).   feeding supplement Liqd Take 237 mLs by mouth daily.   fiber supplement (BANATROL TF) liquid Take 60 mLs by mouth 2 (two) times daily.   Gerhardt's butt cream Crea Apply 1 Application topically 2 (two) times daily.   metoprolol tartrate 25 MG tablet Commonly known as: LOPRESSOR Take 0.5 tablets (12.5 mg total) by mouth 2 (two) times daily.   polyethylene glycol 17 g packet Commonly known as: MIRALAX / GLYCOLAX Take 17 g by mouth 2 (two) times daily as needed for mild constipation.   senna-docusate 8.6-50 MG tablet Commonly known as: Senokot-S Take 1 tablet by mouth 2 (two) times daily as needed for moderate constipation.                Discharge Care Instructions  (From admission, onward)           Start     Ordered   04/17/22 0000  Discharge wound care:       Comments: Local wound care   04/17/22 1151            Contact information for after-discharge care     Destination     [facility] Preferred SNF .   Service: Skilled Nursing Contact information: [address] [place] [address] [phone]                    Discharge Exam: Filed Weights   04/12/22 0500 04/13/22 0328 04/16/22 0500  Weight: 54.9 kg 68.6 kg 68.7 kg   General exam: Appears calm and comfortable  Respiratory system: Clear to auscultation. Respiratory effort normal. Cardiovascular system: S1 & S2 heard, RRR. No JVD,  No pedal edema. Gastrointestinal system: Abdomen is nondistended, soft and nontender.Normal bowel sounds heard. Central nervous system: Alert and oriented. No focal neurological deficits. Extremities: Symmetric 5 x 5 power. Skin: No rashes, lesions or ulcers Psychiatry: Mood & affect appropriate.    Condition at discharge: fair  The results of significant diagnostics from this hospitalization (including imaging, microbiology, ancillary and laboratory) are listed below for reference.   Imaging Studies: US RENAL  Result Date: 04/15/2022 CLINICAL DATA:  Renal dysfunction EXAM: RENAL / URINARY TRACT ULTRASOUND COMPLETE COMPARISON:  CT done on 04/09/2022 FINDINGS: Right Kidney: Renal measurements: 10.7 x 5 x 4.4 cm = volume: 122.2 mL. There is no hydronephrosis. There is increased cortical echogenicity. There is trace amount of perinephric fluid. Left Kidney: Renal measurements: 11.1 x 4.8 x 4.2 cm = volume: 117.9 mL. There is no hydronephrosis. There is increased cortical echogenicity. Bladder: Appears normal for degree of bladder distention. Other: None. IMPRESSION: There is no hydronephrosis. Increased cortical echogenicity suggests possible medical renal  disease. Electronically Signed   By: Ernie Avena M.D.   On: 04/15/2022 20:13   DG Abd Portable 1V  Result Date: 04/13/2022 CLINICAL DATA:  Constipation EXAM: PORTABLE ABDOMEN - 1 VIEW COMPARISON:  CT done on 04/09/2022 FINDINGS: There is presence of gas in slightly dilated small bowel loops suggesting ileus. Gas is present in colon. There is small amount of stool in colon. There is no evidence of fecal impaction in rectosigmoid. No abnormal masses or calcifications are seen. IMPRESSION: Presence of gas in few slightly dilated small bowel loops may suggest ileus. There is no evidence of fecal impaction in rectosigmoid. Electronically Signed   By: Ernie Avena M.D.   On: 04/13/2022 13:26   ECHOCARDIOGRAM COMPLETE  Result Date:  04/10/2022    ECHOCARDIOGRAM REPORT   Patient Name:   Gloria [name] Date of Exam: 04/10/2022 Medical Rec #:  [id number]         Height:       63.0 in Accession #:    [id number]        Weight:       131.0 lb Date of Birth:  12/20/1926         BSA:          1.615 m Patient Age:    88 years          BP:           80/47 mmHg Patient Gender: F                 HR:           82 bpm. Exam Location:  Inpatient Procedure: 2D Echo, Cardiac Doppler and Color Doppler Indications:    Other cardiac sounds  History:        Patient has no prior history of Echocardiogram examinations.                 Risk Factors:Hypertension and Dyslipidemia.  Sonographer:    Eduard Roux Referring Phys: [id number] TAYE T GONFA IMPRESSIONS  1. Left ventricular ejection fraction, by estimation, is 70 to 75%. The left ventricle has hyperdynamic function. The left ventricle has no regional wall motion abnormalities. There is severe concentric left ventricular hypertrophy. Left ventricular diastolic parameters are consistent with Grade I diastolic dysfunction (impaired relaxation). Mitral chordal SAM noted with turbulent LVOT flow, not clearly assessed for gradient.  2. Right ventricular systolic function  is normal. The right ventricular size is normal. There is normal pulmonary artery systolic pressure. The estimated right ventricular systolic pressure is 35.3 mmHg.  3. The mitral valve is abnormal. Mild mitral valve regurgitation.  4. The aortic valve is tricuspid. There is severe calcifcation of the aortic valve. Aortic valve regurgitation is not visualized. Moderate aortic valve stenosis, low gradient. Aortic valve area, by VTI measures 1.33 cm. Aortic valve mean gradient measures 14.5 mmHg. Aortic valve Vmax measures 2.68 m/s.  5. The inferior vena cava is normal in size with greater than 50% respiratory variability, suggesting right atrial pressure of 3 mmHg. Comparison(s): No prior Echocardiogram. FINDINGS  Left Ventricle: Left ventricular ejection fraction, by estimation, is 70 to 75%. The left ventricle has hyperdynamic function. The left ventricle has no regional wall motion abnormalities. The left ventricular internal cavity size was small. There is severe concentric left ventricular hypertrophy. Left ventricular diastolic parameters are consistent with Grade I diastolic dysfunction (impaired relaxation). Right Ventricle: The right ventricular size is normal. No increase in right ventricular wall thickness. Right ventricular systolic function is normal. There is normal pulmonary artery systolic pressure. The tricuspid regurgitant velocity is 2.84 m/s, and  with an assumed right atrial pressure of 3 mmHg, the estimated right ventricular systolic pressure is 35.3 mmHg. Left Atrium: Left atrial size was normal in size. Right Atrium: Right atrial size was normal in size. Pericardium: There is no evidence of pericardial effusion. Mitral Valve: The mitral valve is abnormal. There is mild thickening of the mitral valve leaflet(s). There is mild calcification of the mitral valve leaflet(s). Mild mitral annular calcification. Mild mitral valve regurgitation. Tricuspid Valve: The tricuspid valve is grossly normal.  Tricuspid valve regurgitation is mild. Aortic Valve: The aortic valve is tricuspid. There is severe calcifcation of the aortic valve. There is mild aortic  valve annular calcification. Aortic valve regurgitation is not visualized. Moderate aortic stenosis is present. Aortic valve mean gradient  measures 14.5 mmHg. Aortic valve peak gradient measures 28.7 mmHg. Aortic valve area, by VTI measures 1.33 cm. Pulmonic Valve: The pulmonic valve was grossly normal. Pulmonic valve regurgitation is mild. Aorta: The aortic root is normal in size and structure. Venous: The inferior vena cava is normal in size with greater than 50% respiratory variability, suggesting right atrial pressure of 3 mmHg. IAS/Shunts: There is right bowing of the interatrial septum, suggestive of elevated left atrial pressure. No atrial level shunt detected by color flow Doppler.  LEFT VENTRICLE PLAX 2D LVIDd:         2.80 cm   Diastology LVIDs:         1.40 cm   LV e' medial:    3.21 cm/s LV PW:         1.50 cm   LV E/e' medial:  16.0 LV IVS:        1.60 cm   LV e' lateral:   5.57 cm/s LVOT diam:     1.70 cm   LV E/e' lateral: 9.2 LV SV:         57 LV SV Index:   35 LVOT Area:     2.27 cm  RIGHT VENTRICLE             IVC RV Basal diam:  3.00 cm     IVC diam: 1.40 cm RV S prime:     16.00 cm/s TAPSE (M-mode): 3.0 cm LEFT ATRIUM             Index        RIGHT ATRIUM           Index LA diam:        3.50 cm 2.17 cm/m   RA Area:     15.50 cm LA Vol (A2C):   35.2 ml 21.79 ml/m  RA Volume:   40.20 ml  24.89 ml/m LA Vol (A4C):   35.3 ml 21.86 ml/m LA Biplane Vol: 36.7 ml 22.72 ml/m  AORTIC VALVE                     PULMONIC VALVE AV Area (Vmax):    1.20 cm      PV Vmax:       0.89 m/s AV Area (Vmean):   1.14 cm      PV Peak grad:  3.2 mmHg AV Area (VTI):     1.33 cm AV Vmax:           268.00 cm/s AV Vmean:          174.500 cm/s AV VTI:            0.424 m AV Peak Grad:      28.7 mmHg AV Mean Grad:      14.5 mmHg LVOT Vmax:         142.00 cm/s LVOT  Vmean:        87.600 cm/s LVOT VTI:          0.249 m LVOT/AV VTI ratio: 0.59  AORTA Ao Root diam: 3.10 cm Ao Asc diam:  3.60 cm MITRAL VALVE               TRICUSPID VALVE MV Area (PHT): 3.20 cm    TR Peak grad:   32.3 mmHg MV Decel Time: 237 msec    TR Vmax:  284.00 cm/s MV E velocity: 51.40 cm/s MV A velocity: 72.00 cm/s  SHUNTS MV E/A ratio:  0.71        Systemic VTI:  0.25 m                            Systemic Diam: 1.70 cm Nona Dell MD Electronically signed by Nona Dell MD Signature Date/Time: 04/10/2022/11:39:58 AM    Final    CT ABDOMEN PELVIS WO CONTRAST  Result Date: 04/09/2022 CLINICAL DATA:  Abdominal pain, diarrhea EXAM: CT ABDOMEN AND PELVIS WITHOUT CONTRAST TECHNIQUE: Multidetector CT imaging of the abdomen and pelvis was performed following the standard protocol without IV contrast. RADIATION DOSE REDUCTION: This exam was performed according to the departmental dose-optimization program which includes automated exposure control, adjustment of the mA and/or kV according to patient size and/or use of iterative reconstruction technique. COMPARISON:  None Available. FINDINGS: Lower chest: There is ectasia of bronchi in lingula. There are small scattered foci of increased interstitial markings in both lower lung fields. There is 11 mm alveolar density in lingula. There are other smaller scattered alveolar densities in both lower lung fields. Coronary artery calcifications are seen. Hepatobiliary: No focal abnormalities are seen in liver. There is no dilation of bile ducts. Gallbladder is distended. There is no wall thickening in gallbladder. Possible sludge is seen in the lumen of gallbladder. Pancreas: No focal abnormalities are seen. Spleen: Unremarkable. Adrenals/Urinary Tract: Adrenals are not enlarged. There is no hydronephrosis. There are no renal or ureteral stones. Urinary bladder is empty. Stomach/Bowel: Stomach is unremarkable. Small bowel loops are not dilated. There is mild  diffuse wall thickening in distal small bowel loops. Appendix is not dilated. Moderate to large amount of stool is seen in rectum. There is mild diffuse wall thickening in rectum. There is stranding in the fat planes around the rectum. Vascular/Lymphatic: Extensive arterial calcifications are seen. Reproductive: Uterus is not distinctly visualized. In image 61 of series 2, there is 2.4 cm fluid density structure in the right side of pelvis. Other: There is no ascites or pneumoperitoneum. Small umbilical hernia containing fat is seen. Small right inguinal hernia containing fat is seen. Musculoskeletal: There is 50% decrease in height of body of L1 vertebra. IMPRESSION: There is moderate to large amount of stool is seen in the lumen of rectum. There is mild diffuse wall thickening in rectum along with perirectal stranding. Possibility of stercoral colitis is not excluded. There is mild diffuse wall thickening in few distal small bowel loops in right lower quadrant suggesting possible nonspecific inflammatory or infectious enteritis. There is no evidence of intestinal obstruction or pneumoperitoneum. There is no hydronephrosis. Appendix is not dilated. There is 2.4 cm smooth marginated fluid density structure in the right side of pelvis which may be ovarian cyst or an artifact due to partial volume averaging of fluid in partly contracted bowel loop. Follow-up pelvic sonogram in 3 months may be considered. There is subtle increased density in the dependent portion of gallbladder suggesting presence of sludge. There is no significant wall thickening in colon. There is no dilation of bile ducts. There is bronchiectasis in lingula. Scattered foci of increased interstitial markings in both lungs suggest scarring or interstitial pneumonia. There is 11 mm nodular density in lingula which may suggest pneumonia or underlying neoplastic process. Follow-up CT in 3 months may be considered. Coronary artery calcifications are seen.  There is 50% decrease in height of body of L1 vertebra  which may suggest recent or old compression fracture. Electronically Signed   By: Ernie AvenaPalani  Rathinasamy M.D.   On: 04/09/2022 14:59   DG Chest Port 1 View  Result Date: 04/09/2022 CLINICAL DATA:  Questionable sepsis-evaluate for abnormality. Diarrhea, sepsis, hypotension. EXAM: PORTABLE CHEST 1 VIEW COMPARISON:  Chest CT 09/25/2017.  Chest CT 09/12/2017. FINDINGS: A portion of the left lateral costophrenic angle is excluded from the field of view. Heart size within normal limits. Aortic atherosclerosis. 3 nodular opacities (measuring up to 5 mm) project in the region of the right upper lobe. Elsewhere, there is no appreciable airspace consolidation. No evidence of pleural effusion or pneumothorax. No acute bony abnormality identified. IMPRESSION: A portion of the left lateral costophrenic angle is excluded from the field of view. Three apparent right upper lobe pulmonary nodules. These may be infectious/inflammatory in etiology given the findings on the prior chest CT of 09/25/2017. However, pulmonary neoplasm cannot be excluded a chest CT is recommended for further characterization. Aortic Atherosclerosis (ICD10-I70.0). Electronically Signed   By: Jackey LogeKyle  Golden D.O.   On: 04/09/2022 12:34    Microbiology: Results for orders placed or performed during the hospital encounter of 04/09/22  Blood Culture (routine x 2)     Status: None   Collection Time: 04/09/22 11:55 AM   Specimen: BLOOD  Result Value Ref Range Status   Specimen Description   Final    BLOOD RIGHT ANTECUBITAL Performed at [facility], [address] [place], [place] [address]    Special Requests   Final    BOTTLES DRAWN AEROBIC AND ANAEROBIC Blood Culture adequate volume Performed at [facility], [address] [place], [place] [address]    Culture   Final    NO GROWTH 5 DAYS Performed at [facility], [address]  [place], [place] [address]    Report Status 04/14/2022 FINAL  Final  Blood Culture (routine x 2)     Status: None   Collection Time: 04/09/22 12:00 PM   Specimen: BLOOD  Result Value Ref Range Status   Specimen Description   Final    BLOOD BLOOD LEFT HAND Performed at [facility], [address] [place], [place] [address]    Special Requests   Final    BOTTLES DRAWN AEROBIC ONLY Blood Culture results may not be optimal due to an inadequate volume of blood received in culture bottles Performed at [facility], [address] [place], [place] [address]    Culture   Final    NO GROWTH 5 DAYS Performed at [facility], [address] [place], [place] [address]    Report Status 04/14/2022 FINAL  Final  C Difficile Quick Screen w PCR reflex     Status: None   Collection Time: 04/09/22  6:07 PM   Specimen: STOOL  Result Value Ref Range Status   C Diff antigen NEGATIVE NEGATIVE Final   C Diff toxin NEGATIVE NEGATIVE Final   C Diff interpretation No C. difficile detected.  Final    Comment: Performed at [facility], [address] [place], [place] [address]  Gastrointestinal Panel by PCR , Stool     Status: None   Collection Time: 04/09/22  6:07 PM   Specimen: STOOL  Result Value Ref Range Status   Campylobacter species NOT DETECTED NOT DETECTED Final   Plesimonas shigelloides NOT DETECTED NOT DETECTED Final   Salmonella species NOT DETECTED NOT DETECTED Final  Yersinia enterocolitica NOT DETECTED NOT DETECTED Final   Vibrio species NOT DETECTED NOT DETECTED Final   Vibrio cholerae NOT DETECTED NOT DETECTED Final   Enteroaggregative E coli (EAEC) NOT DETECTED NOT DETECTED Final   Enteropathogenic E coli (EPEC) NOT DETECTED NOT DETECTED Final   Enterotoxigenic E coli (ETEC) NOT DETECTED NOT DETECTED Final   Shiga like toxin producing E coli (STEC) NOT DETECTED NOT DETECTED Final   Shigella/Enteroinvasive E coli (EIEC) NOT  DETECTED NOT DETECTED Final   Cryptosporidium NOT DETECTED NOT DETECTED Final   Cyclospora cayetanensis NOT DETECTED NOT DETECTED Final   Entamoeba histolytica NOT DETECTED NOT DETECTED Final   Giardia lamblia NOT DETECTED NOT DETECTED Final   Adenovirus F40/41 NOT DETECTED NOT DETECTED Final   Astrovirus NOT DETECTED NOT DETECTED Final   Norovirus GI/GII NOT DETECTED NOT DETECTED Final   Rotavirus A NOT DETECTED NOT DETECTED Final   Sapovirus (I, II, IV, and V) NOT DETECTED NOT DETECTED Final    Comment: Performed at [facility], [address] [place], [place] [address]  MRSA Next Gen by PCR, Nasal     Status: None   Collection Time: 04/09/22  6:28 PM   Specimen: Nasal Mucosa; Nasal Swab  Result Value Ref Range Status   MRSA by PCR Next Gen NOT DETECTED NOT DETECTED Final    Comment: (NOTE) The GeneXpert MRSA Assay (FDA approved for NASAL specimens only), is one component of a comprehensive MRSA colonization surveillance program. It is not intended to diagnose MRSA infection nor to guide or monitor treatment for MRSA infections. Test performance is not FDA approved in patients less than 31 years old. Performed at [facility], [address] [place], [place] [address]     Labs: CBC: Recent Labs  Lab 04/13/22 0600 04/14/22 0524 04/15/22 0608 04/16/22 0559 04/17/22 0652  WBC 12.8* 11.1* 11.9* 12.1* 10.6*  HGB 11.0* 11.1* 10.7* 10.3* 9.9*  HCT 31.3* 31.9* 31.5* 30.3* 29.7*  MCV 88.2 88.9 92.1 90.4 92.2  PLT 128* 137* 141* 152 169   Basic Metabolic Panel: Recent Labs  Lab 04/13/22 0600 04/14/22 0524 04/15/22 0608 04/16/22 0559 04/17/22 0652  NA 131* 134* 134* 133* 135  K 3.5 3.1* 3.6 3.1* 4.1  CL 98 100 100 102 105  CO2 20* 22 23 21* 20*  GLUCOSE 101* 126* 108* 184* 96  BUN 86* 88* 91* 84* 77*  CREATININE 4.66* 4.67* 5.04* 4.68* 4.32*  CALCIUM 7.4* 7.8* 7.9* 7.9* 8.0*  MG 2.0 2.0 2.0 1.8 1.7  PHOS 3.9 3.6 3.5 3.3 3.9   Liver  Function Tests: Recent Labs  Lab 04/11/22 0553 04/12/22 0245 04/13/22 0600 04/14/22 0524 04/15/22 0608 04/16/22 0559 04/17/22 0652  AST 94* 93* 92* 66*  --   --  21  ALT 41 46* 55* 52*  --   --  26  ALKPHOS 78 70 72 74  --   --  51  BILITOT 1.4* 0.9 0.8 0.8  --   --  0.7  PROT 4.7* 4.4* 4.5* 4.9*  --   --  4.9*  ALBUMIN 2.2* 2.0* 2.0* 2.1* 2.2* 2.1* 2.1*   CBG: No results for input(s): "GLUCAP" in the last 168 hours.  Discharge time spent: 42 minutes.   Signed: Kathlen Mody, MD [facility] 04/17/2022

## 2022-04-18 DIAGNOSIS — R5381 Other malaise: Secondary | ICD-10-CM | POA: Diagnosis not present

## 2022-04-18 DIAGNOSIS — I471 Supraventricular tachycardia, unspecified: Secondary | ICD-10-CM | POA: Diagnosis not present

## 2022-04-18 DIAGNOSIS — I10 Essential (primary) hypertension: Secondary | ICD-10-CM | POA: Diagnosis not present

## 2022-04-18 DIAGNOSIS — E778 Other disorders of glycoprotein metabolism: Secondary | ICD-10-CM | POA: Diagnosis not present

## 2022-04-18 DIAGNOSIS — I714 Abdominal aortic aneurysm, without rupture, unspecified: Secondary | ICD-10-CM | POA: Diagnosis not present

## 2022-04-18 DIAGNOSIS — D72829 Elevated white blood cell count, unspecified: Secondary | ICD-10-CM | POA: Diagnosis not present

## 2022-04-18 DIAGNOSIS — N179 Acute kidney failure, unspecified: Secondary | ICD-10-CM | POA: Diagnosis not present

## 2022-04-18 DIAGNOSIS — M810 Age-related osteoporosis without current pathological fracture: Secondary | ICD-10-CM | POA: Diagnosis not present

## 2022-04-18 DIAGNOSIS — E860 Dehydration: Secondary | ICD-10-CM | POA: Diagnosis not present

## 2022-04-19 DIAGNOSIS — D72829 Elevated white blood cell count, unspecified: Secondary | ICD-10-CM | POA: Diagnosis not present

## 2022-04-19 DIAGNOSIS — E872 Acidosis, unspecified: Secondary | ICD-10-CM | POA: Diagnosis not present

## 2022-04-19 DIAGNOSIS — E860 Dehydration: Secondary | ICD-10-CM | POA: Diagnosis not present

## 2022-04-19 DIAGNOSIS — R5381 Other malaise: Secondary | ICD-10-CM | POA: Diagnosis not present

## 2022-04-20 ENCOUNTER — Telehealth: Payer: Self-pay

## 2022-04-20 DIAGNOSIS — L89152 Pressure ulcer of sacral region, stage 2: Secondary | ICD-10-CM | POA: Diagnosis not present

## 2022-04-20 NOTE — Telephone Encounter (Signed)
Transition Care Management Unsuccessful Follow-up Telephone Call  Date of discharge and from where:  [facility] 04/17/2022  Attempts:  1st Attempt  Reason for unsuccessful TCM follow-up call:  No answer/busy

## 2022-04-27 DIAGNOSIS — M6281 Muscle weakness (generalized): Secondary | ICD-10-CM | POA: Diagnosis not present

## 2022-04-27 DIAGNOSIS — I10 Essential (primary) hypertension: Secondary | ICD-10-CM | POA: Diagnosis not present

## 2022-04-27 DIAGNOSIS — N179 Acute kidney failure, unspecified: Secondary | ICD-10-CM | POA: Diagnosis not present

## 2022-04-27 DIAGNOSIS — I471 Supraventricular tachycardia, unspecified: Secondary | ICD-10-CM | POA: Diagnosis not present

## 2022-04-27 DIAGNOSIS — L89152 Pressure ulcer of sacral region, stage 2: Secondary | ICD-10-CM | POA: Diagnosis not present

## 2022-05-02 DIAGNOSIS — R600 Localized edema: Secondary | ICD-10-CM | POA: Diagnosis not present

## 2022-05-04 DIAGNOSIS — D649 Anemia, unspecified: Secondary | ICD-10-CM | POA: Diagnosis not present

## 2022-05-04 DIAGNOSIS — L89152 Pressure ulcer of sacral region, stage 2: Secondary | ICD-10-CM | POA: Diagnosis not present

## 2022-05-04 DIAGNOSIS — M6281 Muscle weakness (generalized): Secondary | ICD-10-CM | POA: Diagnosis not present

## 2022-05-09 DIAGNOSIS — R34 Anuria and oliguria: Secondary | ICD-10-CM | POA: Diagnosis not present

## 2022-05-09 DIAGNOSIS — I214 Non-ST elevation (NSTEMI) myocardial infarction: Secondary | ICD-10-CM | POA: Diagnosis not present

## 2022-05-09 DIAGNOSIS — I10 Essential (primary) hypertension: Secondary | ICD-10-CM | POA: Diagnosis not present

## 2022-05-09 DIAGNOSIS — M6281 Muscle weakness (generalized): Secondary | ICD-10-CM | POA: Diagnosis not present

## 2022-05-09 DIAGNOSIS — M810 Age-related osteoporosis without current pathological fracture: Secondary | ICD-10-CM | POA: Diagnosis not present

## 2022-05-09 DIAGNOSIS — E860 Dehydration: Secondary | ICD-10-CM | POA: Diagnosis not present

## 2022-05-09 DIAGNOSIS — E778 Other disorders of glycoprotein metabolism: Secondary | ICD-10-CM | POA: Diagnosis not present

## 2022-05-09 DIAGNOSIS — E872 Acidosis, unspecified: Secondary | ICD-10-CM | POA: Diagnosis not present

## 2022-05-09 DIAGNOSIS — R600 Localized edema: Secondary | ICD-10-CM | POA: Diagnosis not present

## 2022-05-09 DIAGNOSIS — K219 Gastro-esophageal reflux disease without esophagitis: Secondary | ICD-10-CM | POA: Diagnosis not present

## 2022-05-09 DIAGNOSIS — I714 Abdominal aortic aneurysm, without rupture, unspecified: Secondary | ICD-10-CM | POA: Diagnosis not present

## 2022-05-09 DIAGNOSIS — D72825 Bandemia: Secondary | ICD-10-CM | POA: Diagnosis not present

## 2022-05-09 DIAGNOSIS — N179 Acute kidney failure, unspecified: Secondary | ICD-10-CM | POA: Diagnosis not present

## 2022-05-09 DIAGNOSIS — M6282 Rhabdomyolysis: Secondary | ICD-10-CM | POA: Diagnosis not present

## 2022-05-09 DIAGNOSIS — K5900 Constipation, unspecified: Secondary | ICD-10-CM | POA: Diagnosis not present

## 2022-05-09 DIAGNOSIS — R5381 Other malaise: Secondary | ICD-10-CM | POA: Diagnosis not present

## 2022-05-09 DIAGNOSIS — K529 Noninfective gastroenteritis and colitis, unspecified: Secondary | ICD-10-CM | POA: Diagnosis not present

## 2022-05-10 ENCOUNTER — Telehealth: Payer: Self-pay | Admitting: Internal Medicine

## 2022-05-10 NOTE — Telephone Encounter (Signed)
Patient was scheduled to see Dr.[name] on Friday for a post rehab follow up but would like to be seen sooner for swelling of her legs with drainage. I have added her to your schedule for tomorrow morning but until then, patient would like to know if there is something she can do about the drainage. Please advise

## 2022-05-10 NOTE — Telephone Encounter (Signed)
Please advise for verbal orders 

## 2022-05-10 NOTE — Telephone Encounter (Signed)
Ok for verbals 

## 2022-05-10 NOTE — Telephone Encounter (Signed)
Patient nephew called and is concerned about swelling in patients legs along with leaking fluid from legs. Would like to know if they can be seen sooner than Friday November 3rd. Callback [phone]

## 2022-05-10 NOTE — Telephone Encounter (Signed)
Verbal orders for physical and occupational health 2 times a week for 3 weeks.

## 2022-05-10 NOTE — Telephone Encounter (Signed)
I will see her tomorrow morning.  I won't have any recommendations until then.

## 2022-05-11 ENCOUNTER — Ambulatory Visit (INDEPENDENT_AMBULATORY_CARE_PROVIDER_SITE_OTHER): Payer: PPO | Admitting: Emergency Medicine

## 2022-05-11 ENCOUNTER — Encounter: Payer: Self-pay | Admitting: Emergency Medicine

## 2022-05-11 VITALS — BP 108/72 | HR 83 | Temp 97.9°F | Ht 63.0 in | Wt 127.4 lb

## 2022-05-11 DIAGNOSIS — E876 Hypokalemia: Secondary | ICD-10-CM | POA: Diagnosis not present

## 2022-05-11 DIAGNOSIS — I700 Atherosclerosis of aorta: Secondary | ICD-10-CM

## 2022-05-11 DIAGNOSIS — I10 Essential (primary) hypertension: Secondary | ICD-10-CM

## 2022-05-11 DIAGNOSIS — R600 Localized edema: Secondary | ICD-10-CM

## 2022-05-11 LAB — COMPREHENSIVE METABOLIC PANEL
ALT: 8 U/L (ref 0–35)
AST: 18 U/L (ref 0–37)
Albumin: 3.3 g/dL — ABNORMAL LOW (ref 3.5–5.2)
Alkaline Phosphatase: 73 U/L (ref 39–117)
BUN: 16 mg/dL (ref 6–23)
CO2: 34 mEq/L — ABNORMAL HIGH (ref 19–32)
Calcium: 8.6 mg/dL (ref 8.4–10.5)
Chloride: 99 mEq/L (ref 96–112)
Creatinine, Ser: 0.96 mg/dL (ref 0.40–1.20)
GFR: 51.03 mL/min — ABNORMAL LOW (ref >60.00)
Glucose, Bld: 114 mg/dL — ABNORMAL HIGH (ref 70–99)
Potassium: 3 mEq/L — ABNORMAL LOW (ref 3.5–5.1)
Sodium: 140 mEq/L (ref 135–145)
Total Bilirubin: 1.1 mg/dL (ref 0.2–1.2)
Total Protein: 6.4 g/dL (ref 6.0–8.3)

## 2022-05-11 MED ORDER — FUROSEMIDE 20 MG PO TABS: 20.0000 mg | ORAL_TABLET | Freq: Every day | ORAL | 3 refills | Status: AC

## 2022-05-11 MED ORDER — POTASSIUM CHLORIDE CRYS ER 10 MEQ PO TBCR: 10.0000 meq | EXTENDED_RELEASE_TABLET | Freq: Every day | ORAL | 1 refills | Status: DC

## 2022-05-11 NOTE — Progress Notes (Signed)
Gloria [name] 88 y.o.   Chief Complaint  Patient presents with   Follow-up    F/u rehab  visit, patient is having swelling in bil legs and drainage     HISTORY OF PRESENT ILLNESS: Acute problem visit today.  Patient of Dr. Dan Maker. This is a 88 y.o. female complaining of bilateral leg swelling with slight seeping/drainage for the past several days. Accompanied by family members today. Early last month patient was admitted to the hospital with dehydration, rhabdomyolysis, and acute kidney injury. Doing much better.  Eating well.  Good appetite.  Urinating well with adequate amounts. No other complaints or medical concerns today.  HPI   Prior to Admission medications   Medication Sig Start Date End Date Taking? Authorizing Provider  acetaminophen (TYLENOL) 325 MG tablet Take 2 tablets (650 mg total) by mouth every 6 (six) hours as needed for mild pain (or Fever >/= 101). 04/16/22  Yes Mercy Riding, MD  feeding supplement (ENSURE ENLIVE / ENSURE PLUS) LIQD Take 237 mLs by mouth daily. 04/17/22  Yes Gonfa, Charlesetta Ivory, MD  fiber supplement, BANATROL TF, liquid Take 60 mLs by mouth 2 (two) times daily. 04/16/22  Yes Mercy Riding, MD  furosemide (LASIX) 20 MG tablet Take 1 tablet (20 mg total) by mouth daily. 05/11/22  Yes Kenneith Stief, Ines Bloomer, MD  metoprolol tartrate (LOPRESSOR) 25 MG tablet Take 0.5 tablets (12.5 mg total) by mouth 2 (two) times daily. 04/17/22 04/17/23 Yes Hosie Poisson, MD  Nystatin (GERHARDT'S BUTT CREAM) CREA Apply 1 Application topically 2 (two) times daily. 04/16/22  Yes Mercy Riding, MD  polyethylene glycol (MIRALAX / GLYCOLAX) 17 g packet Take 17 g by mouth 2 (two) times daily as needed for mild constipation. 04/16/22  Yes Gonfa, Charlesetta Ivory, MD  senna-docusate (SENOKOT-S) 8.6-50 MG tablet Take 1 tablet by mouth 2 (two) times daily as needed for moderate constipation. 04/16/22  Yes Mercy Riding, MD    No Known Allergies  Patient Active Problem List   Diagnosis Date  Noted   NSTEMI (non-ST elevated myocardial infarction) ([place])    Shock ([place])    Constipation 04/09/2022   Aortic atherosclerosis ([place]) 11/11/2020   Ascending aortic aneurysm ([place]) 09/27/2017   Heart murmur 03/26/2012   Impaired glucose tolerance 09/17/2011   Preventative health care 09/17/2011   Vitamin D deficiency 08/26/2009   Hyperlipidemia 04/13/2007   Essential hypertension 04/10/2007   GERD 04/10/2007   Osteoporosis 04/10/2007    Past Medical History:  Diagnosis Date   GERD 04/10/2007   Qualifier: Diagnosis of  By: Elveria Royals    HYPERLIPIDEMIA 04/13/2007   Qualifier: Diagnosis of  By: Jenny Reichmann MD, Hunt Oris    HYPERTENSION 04/10/2007   Qualifier: Diagnosis of  By: Elveria Royals    Impaired glucose tolerance 09/17/2011   OSTEOPOROSIS 04/10/2007   Qualifier: Diagnosis of  By: Elveria Royals    Unspecified vitamin D deficiency 08/26/2009   Centricity Description: UNSPECIFIED VITAMIN D DEFICIENCY Qualifier: Diagnosis of  By: Jenny Reichmann MD, Hunt Oris  Centricity Description: VITAMIN D DEFICIENCY Qualifier: Diagnosis of  By: Jenny Reichmann MD, Hunt Oris     Past Surgical History:  Procedure Laterality Date   CATARACT EXTRACTION      Social History   Socioeconomic History   Marital status: Single    Spouse name: Not on file   Number of children: Not on file   Years of education: Not on file   Highest education level: Not on file  Occupational History   Not on file  Tobacco Use   Smoking status: Never   Smokeless tobacco: Never  Substance and Sexual Activity   Alcohol use: No   Drug use: No   Sexual activity: Not on file  Other Topics Concern   Not on file  Social History Narrative   Not on file   Social Determinants of Health   Financial Resource Strain: Low Risk  (11/16/2021)   Overall Financial Resource Strain (CARDIA)    Difficulty of Paying Living Expenses: Not hard at all  Food Insecurity: No Food Insecurity (11/16/2021)   Hunger Vital Sign    Worried  About Running Out of Food in the Last Year: Never true    Ran Out of Food in the Last Year: Never true  Transportation Needs: No Transportation Needs (04/16/2022)   PRAPARE - Administrator, Civil Service (Medical): No    Lack of Transportation (Non-Medical): No  Physical Activity: Sufficiently Active (11/16/2021)   Exercise Vital Sign    Days of Exercise per Week: 5 days    Minutes of Exercise per Session: 30 min  Stress: No Stress Concern Present (11/16/2021)   [employer] of Occupational Health - Occupational Stress Questionnaire    Feeling of Stress : Not at all  Social Connections: Moderately Integrated (11/16/2021)   Social Connection and Isolation Panel [NHANES]    Frequency of Communication with Friends and Family: More than three times a week    Frequency of Social Gatherings with Friends and Family: More than three times a week    Attends Religious Services: More than 4 times per year    Active Member of [employer] or Organizations: Yes    Attends Banker Meetings: More than 4 times per year    Marital Status: Widowed  Intimate Partner Violence: Not At Risk (04/16/2022)   Humiliation, Afraid, Rape, and Kick questionnaire    Fear of Current or Ex-Partner: No    Emotionally Abused: No    Physically Abused: No    Sexually Abused: No    Family History  Problem Relation Age of Onset   Heart disease Father    Stroke Sister      Review of Systems  Constitutional: Negative.  Negative for chills and fever.  HENT: Negative.  Negative for congestion and sore throat.   Respiratory: Negative.  Negative for cough and shortness of breath.   Cardiovascular:  Positive for leg swelling. Negative for chest pain and palpitations.  Gastrointestinal:  Negative for abdominal pain, diarrhea, nausea and vomiting.  Genitourinary: Negative.   Musculoskeletal: Negative.  Negative for myalgias.  Skin: Negative.  Negative for rash.  Neurological: Negative.  Negative for  dizziness and headaches.  All other systems reviewed and are negative.  Today's Vitals   05/11/22 0850  BP: 108/72  Pulse: 83  Temp: 97.9 F (36.6 C)  TempSrc: Oral  SpO2: 93%  Weight: 127 lb 6 oz (57.8 kg)  Height: 5\' 3"  (1.6 m)   Body mass index is 22.56 kg/m.   Physical Exam Vitals reviewed.  Constitutional:      Appearance: Normal appearance.  HENT:     Head: Normocephalic.  Eyes:     Extraocular Movements: Extraocular movements intact.  Cardiovascular:     Rate and Rhythm: Normal rate and regular rhythm.     Pulses: Normal pulses.     Heart sounds: Normal heart sounds.  Pulmonary:     Effort: Pulmonary effort is normal.  Breath sounds: Normal breath sounds.  Abdominal:     Palpations: Abdomen is soft.     Tenderness: There is no abdominal tenderness.  Musculoskeletal:     Right lower leg: Edema present.     Left lower leg: Edema present.     Comments: Mild to moderate edema of lower extremities and feet. No cellulitis. No noticeable drainage. Warm and dry.  Skin:    General: Skin is warm and dry.  Neurological:     General: No focal deficit present.     Mental Status: She is alert and oriented to person, place, and time.  Psychiatric:        Mood and Affect: Mood normal.        Behavior: Behavior normal.    Results for orders placed or performed in visit on 05/11/22 (from the past 24 hour(s))  Comprehensive metabolic panel     Status: Abnormal   Collection Time: 05/11/22  9:30 AM  Result Value Ref Range   Sodium 140 135 - 145 mEq/L   Potassium 3.0 (L) 3.5 - 5.1 mEq/L   Chloride 99 96 - 112 mEq/L   CO2 34 (H) 19 - 32 mEq/L   Glucose, Bld 114 (H) 70 - 99 mg/dL   BUN 16 6 - 23 mg/dL   Creatinine, Ser 1.610.96 0.40 - 1.20 mg/dL   Total Bilirubin 1.1 0.2 - 1.2 mg/dL   Alkaline Phosphatase 73 39 - 117 U/L   AST 18 0 - 37 U/L   ALT 8 0 - 35 U/L   Total Protein 6.4 6.0 - 8.3 g/dL   Albumin 3.3 (L) 3.5 - 5.2 g/dL   GFR 09.6051.03 (L) >45.40>60.00 mL/min    Calcium 8.6 8.4 - 10.5 mg/dL     ASSESSMENT & PLAN: A total of 48 minutes was spent with the patient and counseling/coordination of care regarding preparing for this visit, review of available medical records, review of most recent hospital admission discharge summary, review of multiple chronic medical problems and their management, review of all medications, findings and treatment of bilateral leg edema, review of most recent blood work results, prognosis, documentation, and need for follow-up.  Problem List Items Addressed This Visit       Cardiovascular and Mediastinum   Essential hypertension    Well-controlled hypertension. Continue metoprolol tartrate 12.5 mg twice a day BP Readings from Last 3 Encounters:  05/11/22 108/72  04/17/22 (!) 144/84  11/16/21 112/76         Relevant Medications   furosemide (LASIX) 20 MG tablet   Aortic atherosclerosis (H[facility])    Recently taken off statin medication due to rhabdomyolysis.      Relevant Medications   furosemide (LASIX) 20 MG tablet     Other   Bilateral leg edema - Primary    Patient has adequate urination during the day. Kidney function test almost back to normal with a GFR of 51. May benefit from low-dose furosemide 20 mg daily Patient will follow-up with both nephrologist and PCP within the next couple of weeks.      Relevant Medications   furosemide (LASIX) 20 MG tablet   Other Relevant Orders   Comprehensive metabolic panel (Completed)   Other Visit Diagnoses     Hypokalemia       Relevant Medications   potassium chloride (KLOR-CON M) 10 MEQ tablet      Patient Instructions  Peripheral Edema  Peripheral edema is swelling that is caused by a buildup of fluid. Peripheral edema  most often affects the lower legs, ankles, and feet. It can also develop in the arms, hands, and face. The area of the body that has peripheral edema will look swollen. It may also feel heavy or warm. Your clothes may start to feel tight.  Pressing on the area may make a temporary dent in your skin (pitting edema). You may not be able to move your swollen arm or leg as much as usual. There are many causes of peripheral edema. It can happen because of a complication of other conditions such as heart failure, kidney disease, or a problem with your circulation. It also can be a side effect of certain medicines or happen because of an infection. It often happens to women during pregnancy. Sometimes, the cause is not known. Follow these instructions at home: Managing pain, stiffness, and swelling  Raise (elevate) your legs while you are sitting or lying down. Move around often to prevent stiffness and to reduce swelling. Do not sit or stand for long periods of time. Do not wear tight clothing. Do not wear garters on your upper legs. Exercise your legs to get your circulation going. This helps to move the fluid back into your blood vessels, and it may help the swelling go down. Wear compression stockings as told by your health care provider. These stockings help to prevent blood clots and reduce swelling in your legs. It is important that these are the correct size. These stockings should be prescribed by your doctor to prevent possible injuries. If elastic bandages or wraps are recommended, use them as told by your health care provider. Medicines Take over-the-counter and prescription medicines only as told by your health care provider. Your health care provider may prescribe medicine to help your body get rid of excess water (diuretic). Take this medicine if you are told to take it. General instructions Eat a low-salt (low-sodium) diet as told by your health care provider. Sometimes, eating less salt may reduce swelling. Pay attention to any changes in your symptoms. Moisturize your skin daily to help prevent skin from cracking and draining. Keep all follow-up visits. This is important. Contact a health care provider if: You have a  fever. You have swelling in only one leg. You have increased swelling, redness, or pain in one or both of your legs. You have drainage or sores at the area where you have edema. Get help right away if: You have edema that starts suddenly or is getting worse, especially if you are pregnant or have a medical condition. You develop shortness of breath, especially when you are lying down. You have pain in your chest or abdomen. You feel weak. You feel like you will faint. These symptoms may be an emergency. Get help right away. Call 911. Do not wait to see if the symptoms will go away. Do not drive yourself to the hospital. Summary Peripheral edema is swelling that is caused by a buildup of fluid. Peripheral edema most often affects the lower legs, ankles, and feet. Move around often to prevent stiffness and to reduce swelling. Do not sit or stand for long periods of time. Pay attention to any changes in your symptoms. Contact a health care provider if you have edema that starts suddenly or is getting worse, especially if you are pregnant or have a medical condition. Get help right away if you develop shortness of breath, especially when lying down. This information is not intended to replace advice given to you by your health care provider.  Make sure you discuss any questions you have with your health care provider. Document Revised: 03/01/2021 Document Reviewed: 03/01/2021 Elsevier Patient Education  2023 Elsevier Inc.    Edwina Barth, MD [facility]

## 2022-05-11 NOTE — Telephone Encounter (Signed)
Gloria [name] given ok for verbal orders

## 2022-05-11 NOTE — Assessment & Plan Note (Signed)
Recently taken off statin medication due to rhabdomyolysis.

## 2022-05-11 NOTE — Patient Instructions (Signed)
Peripheral Edema  Peripheral edema is swelling that is caused by a buildup of fluid. Peripheral edema most often affects the lower legs, ankles, and feet. It can also develop in the arms, hands, and face. The area of the body that has peripheral edema will look swollen. It may also feel heavy or warm. Your clothes may start to feel tight. Pressing on the area may make a temporary dent in your skin (pitting edema). You may not be able to move your swollen arm or leg as much as usual. There are many causes of peripheral edema. It can happen because of a complication of other conditions such as heart failure, kidney disease, or a problem with your circulation. It also can be a side effect of certain medicines or happen because of an infection. It often happens to women during pregnancy. Sometimes, the cause is not known. Follow these instructions at home: Managing pain, stiffness, and swelling  Raise (elevate) your legs while you are sitting or lying down. Move around often to prevent stiffness and to reduce swelling. Do not sit or stand for long periods of time. Do not wear tight clothing. Do not wear garters on your upper legs. Exercise your legs to get your circulation going. This helps to move the fluid back into your blood vessels, and it may help the swelling go down. Wear compression stockings as told by your health care provider. These stockings help to prevent blood clots and reduce swelling in your legs. It is important that these are the correct size. These stockings should be prescribed by your doctor to prevent possible injuries. If elastic bandages or wraps are recommended, use them as told by your health care provider. Medicines Take over-the-counter and prescription medicines only as told by your health care provider. Your health care provider may prescribe medicine to help your body get rid of excess water (diuretic). Take this medicine if you are told to take it. General  instructions Eat a low-salt (low-sodium) diet as told by your health care provider. Sometimes, eating less salt may reduce swelling. Pay attention to any changes in your symptoms. Moisturize your skin daily to help prevent skin from cracking and draining. Keep all follow-up visits. This is important. Contact a health care provider if: You have a fever. You have swelling in only one leg. You have increased swelling, redness, or pain in one or both of your legs. You have drainage or sores at the area where you have edema. Get help right away if: You have edema that starts suddenly or is getting worse, especially if you are pregnant or have a medical condition. You develop shortness of breath, especially when you are lying down. You have pain in your chest or abdomen. You feel weak. You feel like you will faint. These symptoms may be an emergency. Get help right away. Call 911. Do not wait to see if the symptoms will go away. Do not drive yourself to the hospital. Summary Peripheral edema is swelling that is caused by a buildup of fluid. Peripheral edema most often affects the lower legs, ankles, and feet. Move around often to prevent stiffness and to reduce swelling. Do not sit or stand for long periods of time. Pay attention to any changes in your symptoms. Contact a health care provider if you have edema that starts suddenly or is getting worse, especially if you are pregnant or have a medical condition. Get help right away if you develop shortness of breath, especially when lying down.   This information is not intended to replace advice given to you by your health care provider. Make sure you discuss any questions you have with your health care provider. Document Revised: 03/01/2021 Document Reviewed: 03/01/2021 Elsevier Patient Education  2023 Elsevier Inc.  

## 2022-05-11 NOTE — Assessment & Plan Note (Signed)
Patient has adequate urination during the day. Kidney function test almost back to normal with a GFR of 51. May benefit from low-dose furosemide 20 mg daily Patient will follow-up with both nephrologist and PCP within the next couple of weeks.

## 2022-05-11 NOTE — Assessment & Plan Note (Signed)
Well-controlled hypertension. Continue metoprolol tartrate 12.5 mg twice a day BP Readings from Last 3 Encounters:  05/11/22 108/72  04/17/22 (!) 144/84  11/16/21 112/76

## 2022-05-12 DIAGNOSIS — D72825 Bandemia: Secondary | ICD-10-CM | POA: Diagnosis not present

## 2022-05-12 DIAGNOSIS — R34 Anuria and oliguria: Secondary | ICD-10-CM | POA: Diagnosis not present

## 2022-05-12 DIAGNOSIS — I10 Essential (primary) hypertension: Secondary | ICD-10-CM | POA: Diagnosis not present

## 2022-05-12 DIAGNOSIS — M6282 Rhabdomyolysis: Secondary | ICD-10-CM | POA: Diagnosis not present

## 2022-05-12 DIAGNOSIS — K219 Gastro-esophageal reflux disease without esophagitis: Secondary | ICD-10-CM | POA: Diagnosis not present

## 2022-05-12 DIAGNOSIS — K5900 Constipation, unspecified: Secondary | ICD-10-CM | POA: Diagnosis not present

## 2022-05-12 DIAGNOSIS — N179 Acute kidney failure, unspecified: Secondary | ICD-10-CM | POA: Diagnosis not present

## 2022-05-12 DIAGNOSIS — M6281 Muscle weakness (generalized): Secondary | ICD-10-CM | POA: Diagnosis not present

## 2022-05-12 DIAGNOSIS — I214 Non-ST elevation (NSTEMI) myocardial infarction: Secondary | ICD-10-CM | POA: Diagnosis not present

## 2022-05-12 DIAGNOSIS — K529 Noninfective gastroenteritis and colitis, unspecified: Secondary | ICD-10-CM | POA: Diagnosis not present

## 2022-05-13 ENCOUNTER — Ambulatory Visit: Payer: PPO | Admitting: Internal Medicine

## 2022-05-19 DIAGNOSIS — N179 Acute kidney failure, unspecified: Secondary | ICD-10-CM | POA: Diagnosis not present

## 2022-05-19 DIAGNOSIS — N1831 Chronic kidney disease, stage 3a: Secondary | ICD-10-CM | POA: Diagnosis not present

## 2022-05-19 DIAGNOSIS — E559 Vitamin D deficiency, unspecified: Secondary | ICD-10-CM | POA: Diagnosis not present

## 2022-05-19 DIAGNOSIS — N390 Urinary tract infection, site not specified: Secondary | ICD-10-CM | POA: Diagnosis not present

## 2022-05-19 DIAGNOSIS — I129 Hypertensive chronic kidney disease with stage 1 through stage 4 chronic kidney disease, or unspecified chronic kidney disease: Secondary | ICD-10-CM | POA: Diagnosis not present

## 2022-05-19 DIAGNOSIS — R609 Edema, unspecified: Secondary | ICD-10-CM | POA: Diagnosis not present

## 2022-05-19 DIAGNOSIS — N189 Chronic kidney disease, unspecified: Secondary | ICD-10-CM | POA: Diagnosis not present

## 2022-05-25 ENCOUNTER — Ambulatory Visit (INDEPENDENT_AMBULATORY_CARE_PROVIDER_SITE_OTHER): Payer: PPO | Admitting: Internal Medicine

## 2022-05-25 ENCOUNTER — Other Ambulatory Visit: Payer: Self-pay

## 2022-05-25 ENCOUNTER — Encounter: Payer: Self-pay | Admitting: Internal Medicine

## 2022-05-25 ENCOUNTER — Other Ambulatory Visit: Payer: Self-pay | Admitting: Internal Medicine

## 2022-05-25 VITALS — BP 120/68 | HR 79 | Temp 98.2°F | Ht 63.0 in | Wt 126.0 lb

## 2022-05-25 DIAGNOSIS — D638 Anemia in other chronic diseases classified elsewhere: Secondary | ICD-10-CM | POA: Insufficient documentation

## 2022-05-25 DIAGNOSIS — E876 Hypokalemia: Secondary | ICD-10-CM

## 2022-05-25 DIAGNOSIS — D509 Iron deficiency anemia, unspecified: Secondary | ICD-10-CM | POA: Diagnosis not present

## 2022-05-25 DIAGNOSIS — R7302 Impaired glucose tolerance (oral): Secondary | ICD-10-CM | POA: Diagnosis not present

## 2022-05-25 DIAGNOSIS — N189 Chronic kidney disease, unspecified: Secondary | ICD-10-CM | POA: Insufficient documentation

## 2022-05-25 DIAGNOSIS — L609 Nail disorder, unspecified: Secondary | ICD-10-CM | POA: Diagnosis not present

## 2022-05-25 DIAGNOSIS — N390 Urinary tract infection, site not specified: Secondary | ICD-10-CM | POA: Diagnosis not present

## 2022-05-25 DIAGNOSIS — R600 Localized edema: Secondary | ICD-10-CM | POA: Diagnosis not present

## 2022-05-25 DIAGNOSIS — I10 Essential (primary) hypertension: Secondary | ICD-10-CM | POA: Diagnosis not present

## 2022-05-25 LAB — HEPATIC FUNCTION PANEL
ALT: 22 U/L (ref 0–35)
AST: 25 U/L (ref 0–37)
Albumin: 3.6 g/dL (ref 3.5–5.2)
Alkaline Phosphatase: 72 U/L (ref 39–117)
Bilirubin, Direct: 0.1 mg/dL (ref 0.0–0.3)
Total Bilirubin: 0.7 mg/dL (ref 0.2–1.2)
Total Protein: 6.9 g/dL (ref 6.0–8.3)

## 2022-05-25 LAB — BASIC METABOLIC PANEL
BUN: 16 mg/dL (ref 6–23)
CO2: 32 mEq/L (ref 19–32)
Calcium: 9.4 mg/dL (ref 8.4–10.5)
Chloride: 101 mEq/L (ref 96–112)
Creatinine, Ser: 0.98 mg/dL (ref 0.40–1.20)
GFR: 49.77 mL/min — ABNORMAL LOW (ref >60.00)
Glucose, Bld: 97 mg/dL (ref 70–99)
Potassium: 4 mEq/L (ref 3.5–5.1)
Sodium: 138 mEq/L (ref 135–145)

## 2022-05-25 LAB — URINALYSIS, ROUTINE W REFLEX MICROSCOPIC
Bilirubin Urine: NEGATIVE
Ketones, ur: NEGATIVE
Nitrite: NEGATIVE
Specific Gravity, Urine: 1.01 (ref 1.000–1.030)
Total Protein, Urine: NEGATIVE
Urine Glucose: NEGATIVE
Urobilinogen, UA: 0.2 (ref 0.0–1.0)
pH: 7 (ref 5.0–8.0)

## 2022-05-25 LAB — CBC WITH DIFFERENTIAL/PLATELET
Basophils Absolute: 0.1 10*3/uL (ref 0.0–0.1)
Basophils Relative: 0.6 % (ref 0.0–3.0)
Eosinophils Absolute: 0.8 10*3/uL — ABNORMAL HIGH (ref 0.0–0.7)
Eosinophils Relative: 8.6 % — ABNORMAL HIGH (ref 0.0–5.0)
HCT: 26.3 % — ABNORMAL LOW (ref 36.0–46.0)
Hemoglobin: 9 g/dL — ABNORMAL LOW (ref 12.0–15.0)
Lymphocytes Relative: 26.7 % (ref 12.0–46.0)
Lymphs Abs: 2.6 10*3/uL (ref 0.7–4.0)
MCHC: 34.2 g/dL (ref 30.0–36.0)
MCV: 93.3 fl (ref 78.0–100.0)
Monocytes Absolute: 0.6 10*3/uL (ref 0.1–1.0)
Monocytes Relative: 6.1 % (ref 3.0–12.0)
Neutro Abs: 5.6 10*3/uL (ref 1.4–7.7)
Neutrophils Relative %: 58 % (ref 43.0–77.0)
Platelets: 363 10*3/uL (ref 150.0–400.0)
RBC: 2.82 Mil/uL — ABNORMAL LOW (ref 3.87–5.11)
RDW: 15.9 % — ABNORMAL HIGH (ref 11.5–15.5)
WBC: 9.6 10*3/uL (ref 4.0–10.5)

## 2022-05-25 LAB — IBC PANEL
Iron: 79 ug/dL (ref 42–145)
Saturation Ratios: 31.2 % (ref 20.0–50.0)
TIBC: 253.4 ug/dL (ref 250.0–450.0)
Transferrin: 181 mg/dL — ABNORMAL LOW (ref 212.0–360.0)

## 2022-05-25 LAB — FERRITIN: Ferritin: 307.4 ng/mL — ABNORMAL HIGH (ref 10.0–291.0)

## 2022-05-25 MED ORDER — CEPHALEXIN 500 MG PO CAPS: 500.0000 mg | ORAL_CAPSULE | Freq: Three times a day (TID) | ORAL | 0 refills | Status: DC

## 2022-05-25 MED ORDER — POTASSIUM CHLORIDE ER 10 MEQ PO TBCR: 10.0000 meq | EXTENDED_RELEASE_TABLET | Freq: Two times a day (BID) | ORAL | 11 refills | Status: DC

## 2022-05-25 NOTE — Assessment & Plan Note (Signed)
Lab Results  Component Value Date   HGBA1C 5.5 11/16/2021   Stable, pt to continue current medical treatment  - diet, wt control, excercise

## 2022-05-25 NOTE — Patient Instructions (Addendum)
Please finish the process for the MyChart signup as it is not currently active (see text message on your phone)  Ok to increase the potassium to twice per day (I sent new prescription)  Please continue all other medications as before, including the new fluid pill for now  The backside sores appear to be well healed, but watch the right distal dorsal foot with the new small sore there related to the swelling process  You will be contacted regarding the referral for: podiatry for the nails  Please have the pharmacy call with any other refills you may need.  Please continue your efforts at being more active, low cholesterol diet  Please keep your appointments with your specialists as you may have planned - kidney doctor and IV iron  Please go to the LAB at the blood drawing area for the tests to be done - to recheck the potassium and possible urinary infection  Please go to the LAB at the blood drawing area for the tests to be done - also for the Stool Cards  You will be contacted by phone if any changes need to be made immediately.  Otherwise, you will receive a letter about your results with an explanation, but please check with MyChart first.  Please remember to sign up for MyChart if you have not done so, as this will be important to you in the future with finding out test results, communicating by private email, and scheduling acute appointments online when needed.  Please make an Appointment to return in 1 months, or sooner if needed

## 2022-05-25 NOTE — Assessment & Plan Note (Signed)
Reportedly mild after started lasix 20 qd, now for increased klor con 10 bid

## 2022-05-25 NOTE — Assessment & Plan Note (Signed)
With improved degree of edema but mild edema persists, cont lasix, and increased klor con 10 meq bid

## 2022-05-25 NOTE — Assessment & Plan Note (Signed)
Family upset that no antibx sent to [facility] per renal CP, and unable to get the office to respond on this issue, now for f/u UA and cultlure.

## 2022-05-25 NOTE — Progress Notes (Signed)
Patient ID: Gloria [name], female   DOB: [birth year], 88 y.o.   MRN: [id number]        Chief Complaint: follow up post hospn and rehab x 20 days       HPI:  Gloria [name] is a 88 y.o. female here after hospn  9///30 to oct 8, with elevated troponin possible NSTEMI, hypotension with dehydration, AKI, diarrhea, SVT, rhabdomyolysis, genera weakness, also with left buttock decubitus during hospn since healed at rehab.  Did see renal last wk, family reports finding of low K and klor con 10 meq was supposed to be increased to BID, and antibix sent for UTI but no meds rx have been sent to [facility].  Also mentioned low iron with labs, plan is for IV iron soon.  Also has worsening all toenails unable to handle herself, asks for podiatry.Pt denies chest pain, increased sob or doe, wheezing, orthopnea, PND,  palpitations, dizziness or syncope. , but does still have mld persistent bialteral feet ankle edema after IV fluids during hospn; better after start lasix 20 qd.  Recent TTE has normal EF.         Wt Readings from Last 3 Encounters:  05/25/22 126 lb (57.2 kg)  05/11/22 127 lb 6 oz (57.8 kg)  04/16/22 151 lb 7.3 oz (68.7 kg)   BP Readings from Last 3 Encounters:  05/25/22 120/68  05/11/22 108/72  04/17/22 (!) 144/84         Past Medical History:  Diagnosis Date   GERD 04/10/2007   Qualifier: Diagnosis of  By: Maris Berger    HYPERLIPIDEMIA 04/13/2007   Qualifier: Diagnosis of  By: Jonny Ruiz MD, Len Blalock    HYPERTENSION 04/10/2007   Qualifier: Diagnosis of  By: Maris Berger    Impaired glucose tolerance 09/17/2011   OSTEOPOROSIS 04/10/2007   Qualifier: Diagnosis of  By: Maris Berger    Unspecified vitamin D deficiency 08/26/2009   Centricity Description: UNSPECIFIED VITAMIN D DEFICIENCY Qualifier: Diagnosis of  By: Jonny Ruiz MD, Len Blalock  Centricity Description: VITAMIN D DEFICIENCY Qualifier: Diagnosis of  By: Jonny Ruiz MD, Len Blalock    Past Surgical History:  Procedure  Laterality Date   CATARACT EXTRACTION      reports that she has never smoked. She has never used smokeless tobacco. She reports that she does not drink alcohol and does not use drugs. family history includes Heart disease in her father; Stroke in her sister. No Known Allergies Current Outpatient Medications on File Prior to Visit  Medication Sig Dispense Refill   acetaminophen (TYLENOL) 325 MG tablet Take 2 tablets (650 mg total) by mouth every 6 (six) hours as needed for mild pain (or Fever >/= 101).     feeding supplement (ENSURE ENLIVE / ENSURE PLUS) LIQD Take 237 mLs by mouth daily. 237 mL 12   fiber supplement, BANATROL TF, liquid Take 60 mLs by mouth 2 (two) times daily.     furosemide (LASIX) 20 MG tablet Take 1 tablet (20 mg total) by mouth daily. 30 tablet 3   metoprolol tartrate (LOPRESSOR) 25 MG tablet Take 0.5 tablets (12.5 mg total) by mouth 2 (two) times daily. 30 tablet 11   Nystatin (GERHARDT'S BUTT CREAM) CREA Apply 1 Application topically 2 (two) times daily.     polyethylene glycol (MIRALAX / GLYCOLAX) 17 g packet Take 17 g by mouth 2 (two) times daily as needed for mild constipation. 14 each 0   potassium chloride (KLOR-CON M) 10 MEQ tablet Take  1 tablet (10 mEq total) by mouth daily. 30 tablet 1   senna-docusate (SENOKOT-S) 8.6-50 MG tablet Take 1 tablet by mouth 2 (two) times daily as needed for moderate constipation.     No current facility-administered medications on file prior to visit.        ROS:  All others reviewed and negative.  Objective        PE:  BP 120/68 (BP Location: Left Arm, Patient Position: Sitting, Cuff Size: Large)   Pulse 79   Temp 98.2 F (36.8 C) (Oral)   Ht 5\' 3"  (1.6 m)   Wt 126 lb (57.2 kg)   SpO2 97%   BMI 22.32 kg/m                 Constitutional: Pt appears in NAD               HENT: Head: NCAT.                Right Ear: External ear normal.                 Left Ear: External ear normal.                Eyes: . Pupils are equal,  round, and reactive to light. Conjunctivae and EOM are normal               Nose: without d/c or deformity               Neck: Neck supple. Gross normal ROM               Cardiovascular: Normal rate and regular rhythm.                 Pulmonary/Chest: Effort normal and breath sounds without rales or wheezing.                Abd:  Soft, NT, ND, + BS, no organomegaly               Neurological: Pt is alert. At baseline orientation, motor grossly intact               Skin: Skin is warm. No rashes, no other new lesions including buttocks, LE edema - 1+ edema bilat feet ankles               Psychiatric: Pt behavior is normal without agitation   Micro: none  Cardiac tracings I have personally interpreted today:  none  Pertinent Radiological findings (summarize): none   Lab Results  Component Value Date   WBC 9.6 05/25/2022   HGB 9.0 Wrong tube collected (L) 05/25/2022   HCT 26.3 Wrong tube collected (L) 05/25/2022   PLT 363.0 05/25/2022   GLUCOSE 97 05/25/2022   CHOL 143 11/16/2021   TRIG 115.0 11/16/2021   HDL 54.80 11/16/2021   LDLCALC 65 11/16/2021   ALT 22 05/25/2022   AST 25 05/25/2022   NA 138 05/25/2022   K 4.0 05/25/2022   CL 101 05/25/2022   CREATININE 0.98 05/25/2022   BUN 16 05/25/2022   CO2 32 05/25/2022   TSH 3.35 11/16/2021   INR 1.0 04/09/2022   HGBA1C 5.5 11/16/2021   Assessment/Plan:  Gloria [name] is a 87 y.o. White or Caucasian [1] female with  has a past medical history of GERD (04/10/2007), HYPERLIPIDEMIA (04/13/2007), HYPERTENSION (04/10/2007), Impaired glucose tolerance (09/17/2011), OSTEOPOROSIS (04/10/2007), and Unspecified vitamin D deficiency (08/26/2009).  Essential hypertension BP Readings from Last  3 Encounters:  05/25/22 120/68  05/11/22 108/72  04/17/22 (!) 144/84   Stable, pt to continue medical treatment lopressor 25 mg 1/2 bid   Impaired glucose tolerance Lab Results  Component Value Date   HGBA1C 5.5 11/16/2021   Stable, pt to  continue current medical treatment  - diet, wt control, excercise  Bilateral leg edema With improved degree of edema but mild edema persists, cont lasix, and increased klor con 10 meq bid  Hypokalemia Reportedly mild after started lasix 20 qd, now for increased klor con 10 bid  UTI (urinary tract infection) Family upset that no antibx sent to [facility] per renal CP, and unable to get the office to respond on this issue, now for f/u UA and cultlure.  Nail disorder Also for podiatry referral   Iron deficiency anemia Surprise mention by family given recent renal labs, will add iron panel and ferritin to labs, also for stool cards r/o GI bleeding  Followup: Return in about 4 weeks (around 06/22/2022).  Oliver Barre, MD 05/25/2022 8:39 PM [facility]

## 2022-05-25 NOTE — Assessment & Plan Note (Addendum)
Surprise mention by family given recent renal labs, will add iron panel and ferritin to labs, also for stool cards r/o GI bleeding

## 2022-05-25 NOTE — Assessment & Plan Note (Signed)
BP Readings from Last 3 Encounters:  05/25/22 120/68  05/11/22 108/72  04/17/22 (!) 144/84   Stable, pt to continue medical treatment lopressor 25 mg 1/2 bid

## 2022-05-25 NOTE — Assessment & Plan Note (Signed)
Also for podiatry referral 

## 2022-05-28 LAB — URINE CULTURE

## 2022-05-30 ENCOUNTER — Telehealth: Payer: Self-pay | Admitting: Pharmacy Technician

## 2022-05-30 ENCOUNTER — Telehealth: Payer: Self-pay

## 2022-05-30 NOTE — Telephone Encounter (Addendum)
Auth Submission: APPROVED   Payer: [employer] ADVT Medication & CPT/J Code(s) submitted: Feraheme (ferumoxytol) [id number] Route of submission (phone, fax, portal):  Phone # [phone] opt: 3 Fax # [phone] Auth type: Buy/Bill Units/visits requested: x2 Reference number: [id number] Approval from: 05/26/22  to 08/24/22  at [facility]

## 2022-05-30 NOTE — Telephone Encounter (Signed)
Pt health advantage for home health, physical therapy and occupational therapy has been approved 05/09/22-07/07/22  Send to scan

## 2022-06-04 ENCOUNTER — Encounter (HOSPITAL_BASED_OUTPATIENT_CLINIC_OR_DEPARTMENT_OTHER): Payer: Self-pay

## 2022-06-04 ENCOUNTER — Other Ambulatory Visit: Payer: Self-pay

## 2022-06-04 ENCOUNTER — Inpatient Hospital Stay (HOSPITAL_BASED_OUTPATIENT_CLINIC_OR_DEPARTMENT_OTHER)
Admission: EM | Admit: 2022-06-04 | Discharge: 2022-06-08 | DRG: 194 | Disposition: A | Discharge status: Home / Self Care | Payer: PPO | Attending: Internal Medicine | Admitting: Internal Medicine

## 2022-06-04 DIAGNOSIS — J189 Pneumonia, unspecified organism: Secondary | ICD-10-CM | POA: Diagnosis not present

## 2022-06-04 DIAGNOSIS — E871 Hypo-osmolality and hyponatremia: Secondary | ICD-10-CM | POA: Insufficient documentation

## 2022-06-04 DIAGNOSIS — E559 Vitamin D deficiency, unspecified: Secondary | ICD-10-CM | POA: Diagnosis not present

## 2022-06-04 DIAGNOSIS — N390 Urinary tract infection, site not specified: Secondary | ICD-10-CM

## 2022-06-04 DIAGNOSIS — Z20822 Contact with and (suspected) exposure to covid-19: Secondary | ICD-10-CM | POA: Diagnosis not present

## 2022-06-04 DIAGNOSIS — I10 Essential (primary) hypertension: Secondary | ICD-10-CM | POA: Diagnosis present

## 2022-06-04 DIAGNOSIS — E876 Hypokalemia: Secondary | ICD-10-CM | POA: Diagnosis present

## 2022-06-04 DIAGNOSIS — Z8249 Family history of ischemic heart disease and other diseases of the circulatory system: Secondary | ICD-10-CM

## 2022-06-04 DIAGNOSIS — Z66 Do not resuscitate: Secondary | ICD-10-CM | POA: Diagnosis not present

## 2022-06-04 DIAGNOSIS — I959 Hypotension, unspecified: Secondary | ICD-10-CM | POA: Diagnosis present

## 2022-06-04 DIAGNOSIS — K219 Gastro-esophageal reflux disease without esophagitis: Secondary | ICD-10-CM | POA: Diagnosis present

## 2022-06-04 DIAGNOSIS — Z823 Family history of stroke: Secondary | ICD-10-CM

## 2022-06-04 DIAGNOSIS — R739 Hyperglycemia, unspecified: Secondary | ICD-10-CM | POA: Diagnosis not present

## 2022-06-04 DIAGNOSIS — R059 Cough, unspecified: Secondary | ICD-10-CM | POA: Diagnosis not present

## 2022-06-04 DIAGNOSIS — M810 Age-related osteoporosis without current pathological fracture: Secondary | ICD-10-CM | POA: Diagnosis present

## 2022-06-04 DIAGNOSIS — R8281 Pyuria: Secondary | ICD-10-CM | POA: Insufficient documentation

## 2022-06-04 DIAGNOSIS — I480 Paroxysmal atrial fibrillation: Secondary | ICD-10-CM | POA: Diagnosis present

## 2022-06-04 DIAGNOSIS — J188 Other pneumonia, unspecified organism: Secondary | ICD-10-CM | POA: Diagnosis present

## 2022-06-04 DIAGNOSIS — N1831 Chronic kidney disease, stage 3a: Secondary | ICD-10-CM | POA: Diagnosis not present

## 2022-06-04 DIAGNOSIS — I129 Hypertensive chronic kidney disease with stage 1 through stage 4 chronic kidney disease, or unspecified chronic kidney disease: Secondary | ICD-10-CM | POA: Diagnosis present

## 2022-06-04 DIAGNOSIS — D631 Anemia in chronic kidney disease: Secondary | ICD-10-CM | POA: Diagnosis present

## 2022-06-04 DIAGNOSIS — E785 Hyperlipidemia, unspecified: Secondary | ICD-10-CM | POA: Diagnosis not present

## 2022-06-04 DIAGNOSIS — J90 Pleural effusion, not elsewhere classified: Secondary | ICD-10-CM | POA: Diagnosis not present

## 2022-06-04 DIAGNOSIS — D638 Anemia in other chronic diseases classified elsewhere: Secondary | ICD-10-CM | POA: Diagnosis present

## 2022-06-04 DIAGNOSIS — Z79899 Other long term (current) drug therapy: Secondary | ICD-10-CM

## 2022-06-04 NOTE — ED Triage Notes (Signed)
Patient presents from home with niece and nephew who she lives with and are her caretakers. Patient has been congested with productive phlem for the past week. The past day patient has become increasingly weaker.

## 2022-06-05 ENCOUNTER — Encounter (HOSPITAL_COMMUNITY): Payer: Self-pay

## 2022-06-05 ENCOUNTER — Emergency Department (HOSPITAL_BASED_OUTPATIENT_CLINIC_OR_DEPARTMENT_OTHER): Payer: PPO | Admitting: Radiology

## 2022-06-05 DIAGNOSIS — Z8249 Family history of ischemic heart disease and other diseases of the circulatory system: Secondary | ICD-10-CM | POA: Diagnosis not present

## 2022-06-05 DIAGNOSIS — Z20822 Contact with and (suspected) exposure to covid-19: Secondary | ICD-10-CM | POA: Diagnosis present

## 2022-06-05 DIAGNOSIS — D631 Anemia in chronic kidney disease: Secondary | ICD-10-CM | POA: Diagnosis present

## 2022-06-05 DIAGNOSIS — E876 Hypokalemia: Secondary | ICD-10-CM | POA: Diagnosis present

## 2022-06-05 DIAGNOSIS — N390 Urinary tract infection, site not specified: Secondary | ICD-10-CM | POA: Diagnosis present

## 2022-06-05 DIAGNOSIS — K219 Gastro-esophageal reflux disease without esophagitis: Secondary | ICD-10-CM | POA: Diagnosis present

## 2022-06-05 DIAGNOSIS — N1831 Chronic kidney disease, stage 3a: Secondary | ICD-10-CM | POA: Insufficient documentation

## 2022-06-05 DIAGNOSIS — I129 Hypertensive chronic kidney disease with stage 1 through stage 4 chronic kidney disease, or unspecified chronic kidney disease: Secondary | ICD-10-CM | POA: Diagnosis present

## 2022-06-05 DIAGNOSIS — M810 Age-related osteoporosis without current pathological fracture: Secondary | ICD-10-CM | POA: Diagnosis present

## 2022-06-05 DIAGNOSIS — I959 Hypotension, unspecified: Secondary | ICD-10-CM | POA: Diagnosis present

## 2022-06-05 DIAGNOSIS — J189 Pneumonia, unspecified organism: Secondary | ICD-10-CM

## 2022-06-05 DIAGNOSIS — E871 Hypo-osmolality and hyponatremia: Secondary | ICD-10-CM | POA: Insufficient documentation

## 2022-06-05 DIAGNOSIS — E785 Hyperlipidemia, unspecified: Secondary | ICD-10-CM | POA: Diagnosis present

## 2022-06-05 DIAGNOSIS — R8281 Pyuria: Secondary | ICD-10-CM | POA: Insufficient documentation

## 2022-06-05 DIAGNOSIS — R059 Cough, unspecified: Secondary | ICD-10-CM | POA: Diagnosis not present

## 2022-06-05 DIAGNOSIS — I480 Paroxysmal atrial fibrillation: Secondary | ICD-10-CM | POA: Diagnosis present

## 2022-06-05 DIAGNOSIS — E559 Vitamin D deficiency, unspecified: Secondary | ICD-10-CM | POA: Diagnosis present

## 2022-06-05 DIAGNOSIS — Z66 Do not resuscitate: Secondary | ICD-10-CM | POA: Diagnosis present

## 2022-06-05 DIAGNOSIS — Z823 Family history of stroke: Secondary | ICD-10-CM | POA: Diagnosis not present

## 2022-06-05 DIAGNOSIS — Z79899 Other long term (current) drug therapy: Secondary | ICD-10-CM | POA: Diagnosis not present

## 2022-06-05 DIAGNOSIS — R739 Hyperglycemia, unspecified: Secondary | ICD-10-CM | POA: Insufficient documentation

## 2022-06-05 DIAGNOSIS — J90 Pleural effusion, not elsewhere classified: Secondary | ICD-10-CM | POA: Diagnosis not present

## 2022-06-05 LAB — BASIC METABOLIC PANEL
Anion gap: 11 (ref 5–15)
BUN: 16 mg/dL (ref 8–23)
CO2: 24 mmol/L (ref 22–32)
Calcium: 8.9 mg/dL (ref 8.9–10.3)
Chloride: 96 mmol/L — ABNORMAL LOW (ref 98–111)
Creatinine, Ser: 0.93 mg/dL (ref 0.44–1.00)
GFR, Estimated: 57 mL/min — ABNORMAL LOW (ref >60)
Glucose, Bld: 160 mg/dL — ABNORMAL HIGH (ref 70–99)
Potassium: 3.2 mmol/L — ABNORMAL LOW (ref 3.5–5.1)
Sodium: 131 mmol/L — ABNORMAL LOW (ref 135–145)

## 2022-06-05 LAB — URINALYSIS, ROUTINE W REFLEX MICROSCOPIC
Bilirubin Urine: NEGATIVE
Glucose, UA: NEGATIVE mg/dL
Hgb urine dipstick: NEGATIVE
Ketones, ur: NEGATIVE mg/dL
Nitrite: NEGATIVE
Specific Gravity, Urine: 1.013 (ref 1.005–1.030)
pH: 5.5 (ref 5.0–8.0)

## 2022-06-05 LAB — RESP PANEL BY RT-PCR (FLU A&B, COVID) ARPGX2
Influenza A by PCR: NEGATIVE
Influenza B by PCR: NEGATIVE
SARS Coronavirus 2 by RT PCR: NEGATIVE

## 2022-06-05 LAB — CBC WITH DIFFERENTIAL/PLATELET
Abs Immature Granulocytes: 0.12 10*3/uL — ABNORMAL HIGH (ref 0.00–0.07)
Basophils Absolute: 0.1 10*3/uL (ref 0.0–0.1)
Basophils Relative: 0 %
Eosinophils Absolute: 0 10*3/uL (ref 0.0–0.5)
Eosinophils Relative: 0 %
HCT: 26.7 % — ABNORMAL LOW (ref 36.0–46.0)
Hemoglobin: 9 g/dL — ABNORMAL LOW (ref 12.0–15.0)
Immature Granulocytes: 1 %
Lymphocytes Relative: 13 %
Lymphs Abs: 2.1 10*3/uL (ref 0.7–4.0)
MCH: 31.4 pg (ref 26.0–34.0)
MCHC: 33.7 g/dL (ref 30.0–36.0)
MCV: 93 fL (ref 80.0–100.0)
Monocytes Absolute: 1.3 10*3/uL — ABNORMAL HIGH (ref 0.1–1.0)
Monocytes Relative: 8 %
Neutro Abs: 12.8 10*3/uL — ABNORMAL HIGH (ref 1.7–7.7)
Neutrophils Relative %: 78 %
Platelets: 302 10*3/uL (ref 150–400)
RBC: 2.87 MIL/uL — ABNORMAL LOW (ref 3.87–5.11)
RDW: 13.9 % (ref 11.5–15.5)
WBC: 16.4 10*3/uL — ABNORMAL HIGH (ref 4.0–10.5)
nRBC: 0 % (ref 0.0–0.2)

## 2022-06-05 LAB — MRSA NEXT GEN BY PCR, NASAL: MRSA by PCR Next Gen: NOT DETECTED

## 2022-06-05 LAB — LACTIC ACID, PLASMA: Lactic Acid, Venous: 1.3 mmol/L (ref 0.5–1.9)

## 2022-06-05 LAB — MAGNESIUM: Magnesium: 1.9 mg/dL (ref 1.7–2.4)

## 2022-06-05 LAB — STREP PNEUMONIAE URINARY ANTIGEN: Strep Pneumo Urinary Antigen: NEGATIVE

## 2022-06-05 MED ORDER — ENOXAPARIN SODIUM 30 MG/0.3ML IJ SOSY
30.0000 mg | PREFILLED_SYRINGE | INTRAMUSCULAR | Status: DC
  Administered 2022-06-05: 30 mg via SUBCUTANEOUS
  Filled 2022-06-05: qty 0.3

## 2022-06-05 MED ORDER — ENOXAPARIN SODIUM 40 MG/0.4ML IJ SOSY
40.0000 mg | PREFILLED_SYRINGE | INTRAMUSCULAR | Status: DC
  Administered 2022-06-06 – 2022-06-08 (×3): 40 mg via SUBCUTANEOUS
  Filled 2022-06-05 (×3): qty 0.4

## 2022-06-05 MED ORDER — ALBUTEROL SULFATE (2.5 MG/3ML) 0.083% IN NEBU: 2.5000 mg | INHALATION_SOLUTION | RESPIRATORY_TRACT | Status: DC | PRN

## 2022-06-05 MED ORDER — SODIUM CHLORIDE 0.9 % IV SOLN
INTRAVENOUS | Status: DC
  Administered 2022-06-05: 1000 mL via INTRAVENOUS

## 2022-06-05 MED ORDER — GUAIFENESIN ER 600 MG PO TB12
600.0000 mg | ORAL_TABLET | Freq: Two times a day (BID) | ORAL | Status: DC
  Administered 2022-06-05 – 2022-06-08 (×7): 600 mg via ORAL
  Filled 2022-06-05 (×7): qty 1

## 2022-06-05 MED ORDER — ONDANSETRON HCL 4 MG PO TABS: 4.0000 mg | ORAL_TABLET | Freq: Four times a day (QID) | ORAL | Status: DC | PRN

## 2022-06-05 MED ORDER — SODIUM CHLORIDE 0.9 % IV SOLN
500.0000 mg | Freq: Once | INTRAVENOUS | Status: AC
  Administered 2022-06-05: 500 mg via INTRAVENOUS
  Filled 2022-06-05: qty 5

## 2022-06-05 MED ORDER — SODIUM CHLORIDE 0.9 % IV SOLN
500.0000 mg | INTRAVENOUS | Status: DC
  Administered 2022-06-05 – 2022-06-07 (×3): 500 mg via INTRAVENOUS
  Filled 2022-06-05 (×3): qty 5

## 2022-06-05 MED ORDER — ONDANSETRON HCL 4 MG/2ML IJ SOLN: 4.0000 mg | Freq: Four times a day (QID) | INTRAMUSCULAR | Status: DC | PRN

## 2022-06-05 MED ORDER — SODIUM CHLORIDE 0.9 % IV SOLN
2.0000 g | INTRAVENOUS | Status: DC
  Administered 2022-06-05 – 2022-06-07 (×3): 2 g via INTRAVENOUS
  Filled 2022-06-05 (×3): qty 20

## 2022-06-05 MED ORDER — SODIUM CHLORIDE 0.9 % IV SOLN
1.0000 g | Freq: Once | INTRAVENOUS | Status: AC
  Administered 2022-06-05: 1 g via INTRAVENOUS
  Filled 2022-06-05: qty 10

## 2022-06-05 MED ORDER — ACETAMINOPHEN 325 MG PO TABS: 650.0000 mg | ORAL_TABLET | Freq: Four times a day (QID) | ORAL | Status: DC | PRN

## 2022-06-05 MED ORDER — ACETAMINOPHEN 650 MG RE SUPP: 650.0000 mg | Freq: Four times a day (QID) | RECTAL | Status: DC | PRN

## 2022-06-05 MED ORDER — GUAIFENESIN-DM 100-10 MG/5ML PO SYRP
5.0000 mL | ORAL_SOLUTION | ORAL | Status: DC | PRN
  Administered 2022-06-05 – 2022-06-08 (×9): 5 mL via ORAL
  Filled 2022-06-05 (×9): qty 10

## 2022-06-05 MED ORDER — POTASSIUM CHLORIDE CRYS ER 20 MEQ PO TBCR
40.0000 meq | EXTENDED_RELEASE_TABLET | Freq: Every day | ORAL | Status: AC
  Administered 2022-06-05 – 2022-06-06 (×2): 40 meq via ORAL
  Filled 2022-06-05 (×2): qty 2

## 2022-06-05 NOTE — ED Notes (Signed)
Patient, belongings, paperwork transported to [facility] at this time via [facility]. SBAR report given to Elmarie Shiley, Charity fundraiser over [employer]

## 2022-06-05 NOTE — H&P (Signed)
History and Physical    Patient: Gloria [name]:[id number] DOB: 24-Jun-1927 DOA: 06/04/2022 DOS: the patient was seen and examined on 06/05/2022 PCP: [name], MD  Patient coming from: Home  Chief Complaint:  Chief Complaint  Patient presents with   Cough   Weakness   Nasal Congestion   HPI: Gloria [name] is a 88 y.o. female with medical history significant of HTN, AAA, GERD. Presenting with cough and congestion. Symptoms started 3 days ago. The cough has been productive. It has not responded to OTC meds. She has not have N/V/D or fever. She has become progressively weak during this time. When her symptoms did not improve last night, family decided to bring her to the hospital for evaluation. She denies any other aggravating or alleviating factors.   Review of Systems: As mentioned in the history of present illness. All other systems reviewed and are negative. Past Medical History:  Diagnosis Date   GERD 04/10/2007   Qualifier: Diagnosis of  By: Maris Berger    HYPERLIPIDEMIA 04/13/2007   Qualifier: Diagnosis of  By: Jonny Ruiz MD, Len Blalock    HYPERTENSION 04/10/2007   Qualifier: Diagnosis of  By: Maris Berger    Impaired glucose tolerance 09/17/2011   OSTEOPOROSIS 04/10/2007   Qualifier: Diagnosis of  By: Maris Berger    Unspecified vitamin D deficiency 08/26/2009   Centricity Description: UNSPECIFIED VITAMIN D DEFICIENCY Qualifier: Diagnosis of  By: Jonny Ruiz MD, Len Blalock  Centricity Description: VITAMIN D DEFICIENCY Qualifier: Diagnosis of  By: Jonny Ruiz MD, Len Blalock    Past Surgical History:  Procedure Laterality Date   CATARACT EXTRACTION     Social History:  reports that she has never smoked. She has never used smokeless tobacco. She reports that she does not drink alcohol and does not use drugs.  No Known Allergies  Family History  Problem Relation Age of Onset   Heart disease Father    Stroke Sister     Prior to Admission medications    Medication Sig Start Date End Date Taking? Authorizing Provider  cephALEXin (KEFLEX) 500 MG capsule Take 1 capsule (500 mg total) by mouth 3 (three) times daily. 05/25/22   [name], MD  acetaminophen (TYLENOL) 325 MG tablet Take 2 tablets (650 mg total) by mouth every 6 (six) hours as needed for mild pain (or Fever >/= 101). 04/16/22   Almon Hercules, MD  feeding supplement (ENSURE ENLIVE / ENSURE PLUS) LIQD Take 237 mLs by mouth daily. 04/17/22   Almon Hercules, MD  fiber supplement, BANATROL TF, liquid Take 60 mLs by mouth 2 (two) times daily. 04/16/22   Almon Hercules, MD  furosemide (LASIX) 20 MG tablet Take 1 tablet (20 mg total) by mouth daily. 05/11/22   Georgina Quint, MD  metoprolol tartrate (LOPRESSOR) 25 MG tablet Take 0.5 tablets (12.5 mg total) by mouth 2 (two) times daily. 04/17/22 04/17/23  Kathlen Mody, MD  Nystatin (GERHARDT'S BUTT CREAM) CREA Apply 1 Application topically 2 (two) times daily. 04/16/22   Almon Hercules, MD  polyethylene glycol (MIRALAX / GLYCOLAX) 17 g packet Take 17 g by mouth 2 (two) times daily as needed for mild constipation. 04/16/22   Almon Hercules, MD  potassium chloride (KLOR-CON 10) 10 MEQ tablet Take 1 tablet (10 mEq total) by mouth 2 (two) times daily. 05/25/22   [name], MD  potassium chloride (KLOR-CON M) 10 MEQ tablet Take 1 tablet (10 mEq total) by  mouth daily. 05/11/22 07/10/22  Horald Pollen, MD  senna-docusate (SENOKOT-S) 8.6-50 MG tablet Take 1 tablet by mouth 2 (two) times daily as needed for moderate constipation. 04/16/22   Mercy Riding, MD    Physical Exam: Vitals:   06/05/22 0130 06/05/22 0300 06/05/22 0400 06/05/22 0610  BP: 111/65 (!) 99/55 (!) 102/56 (!) 113/49  Pulse: 91 82 85 83  Resp: 15 (!) 23 20 18   Temp:    98 F (36.7 C)  TempSrc:    Oral  SpO2: 97% 91% 96% 97%  Weight:      Height:       General: 88 y.o. female resting in bed in NAD Eyes: PERRL, normal sclera ENMT: Nares patent w/o discharge, orophaynx  clear, dentition poor, ears w/o discharge/lesions/ulcers Neck: Supple, trachea midline Cardiovascular: RRR, +S1, S2, no g/r, 2/6 SEM, equal pulses throughout Respiratory: scattered rhonchi, no w/r, normal WOB GI: BS+, NDNT, no masses noted, no organomegaly noted MSK: No e/c/c Neuro: A&O x 3, other than HoH no focal deficits Psyc: Appropriate interaction and affect, calm/cooperative  Data Reviewed:  Results for orders placed or performed during the hospital encounter of 06/04/22 (from the past 24 hour(s))  Resp Panel by RT-PCR (Flu A&B, Covid) Anterior Nasal Swab     Status: None   Collection Time: 06/04/22 11:30 PM   Specimen: Anterior Nasal Swab  Result Value Ref Range   SARS Coronavirus 2 by RT PCR NEGATIVE NEGATIVE   Influenza A by PCR NEGATIVE NEGATIVE   Influenza B by PCR NEGATIVE NEGATIVE  CBC with Differential     Status: Abnormal   Collection Time: 06/05/22 12:44 AM  Result Value Ref Range   WBC 16.4 (H) 4.0 - 10.5 K/uL   RBC 2.87 (L) 3.87 - 5.11 MIL/uL   Hemoglobin 9.0 (L) 12.0 - 15.0 g/dL   HCT 26.7 (L) 36.0 - 46.0 %   MCV 93.0 80.0 - 100.0 fL   MCH 31.4 26.0 - 34.0 pg   MCHC 33.7 30.0 - 36.0 g/dL   RDW 13.9 11.5 - 15.5 %   Platelets 302 150 - 400 K/uL   nRBC 0.0 0.0 - 0.2 %   Neutrophils Relative % 78 %   Neutro Abs 12.8 (H) 1.7 - 7.7 K/uL   Lymphocytes Relative 13 %   Lymphs Abs 2.1 0.7 - 4.0 K/uL   Monocytes Relative 8 %   Monocytes Absolute 1.3 (H) 0.1 - 1.0 K/uL   Eosinophils Relative 0 %   Eosinophils Absolute 0.0 0.0 - 0.5 K/uL   Basophils Relative 0 %   Basophils Absolute 0.1 0.0 - 0.1 K/uL   Immature Granulocytes 1 %   Abs Immature Granulocytes 0.12 (H) 0.00 - 0.07 K/uL  Basic metabolic panel     Status: Abnormal   Collection Time: 06/05/22  1:25 AM  Result Value Ref Range   Sodium 131 (L) 135 - 145 mmol/L   Potassium 3.2 (L) 3.5 - 5.1 mmol/L   Chloride 96 (L) 98 - 111 mmol/L   CO2 24 22 - 32 mmol/L   Glucose, Bld 160 (H) 70 - 99 mg/dL   BUN 16 8  - 23 mg/dL   Creatinine, Ser 0.93 0.44 - 1.00 mg/dL   Calcium 8.9 8.9 - 10.3 mg/dL   GFR, Estimated 57 (L) >60 mL/min   Anion gap 11 5 - 15  Lactic acid, plasma     Status: None   Collection Time: 06/05/22  2:35 AM  Result Value Ref  Range   Lactic Acid, Venous 1.3 0.5 - 1.9 mmol/L  Urinalysis, Routine w reflex microscopic Urine, Clean Catch     Status: Abnormal   Collection Time: 06/05/22  3:57 AM  Result Value Ref Range   Color, Urine YELLOW YELLOW   APPearance HAZY (A) CLEAR   Specific Gravity, Urine 1.013 1.005 - 1.030   pH 5.5 5.0 - 8.0   Glucose, UA NEGATIVE NEGATIVE mg/dL   Hgb urine dipstick NEGATIVE NEGATIVE   Bilirubin Urine NEGATIVE NEGATIVE   Ketones, ur NEGATIVE NEGATIVE mg/dL   Protein, ur TRACE (A) NEGATIVE mg/dL   Nitrite NEGATIVE NEGATIVE   Leukocytes,Ua MODERATE (A) NEGATIVE   RBC / HPF 0-5 0 - 5 RBC/hpf   WBC, UA 21-50 0 - 5 WBC/hpf   Bacteria, UA MANY (A) NONE SEEN   Squamous Epithelial / LPF 6-10 0 - 5   CXR: Patchy mild infiltrates in the right upper lobe and left base.   Assessment and Plan: Multifocal PNA     - admitted to inpt, tele     - COVID/flu negative     - check MRSA     - continue rocephin/azithro for now     - fluids, guaifenesin  Hypokalemia Hyponatremia     - replace K+; check Mg2+     - Na+ mildly low; give fluids   Hyperglycemia     - check A1c  CKD 3a     - at baseline, follow  HTN     - her pressures are soft; hold home regimen today  Pyuria     - denies symptoms     - getting abx for MFPNA; should cover any UTI as well  ACD     - Hgb is at baseline, no evidence of bleed  Advance Care Planning:   Code Status: DNR (confirmed through multiple lines of questioning)  Consults: None  Family Communication: None at bedside  Severity of Illness: The appropriate patient status for this patient is INPATIENT. Inpatient status is judged to be reasonable and necessary in order to provide the required intensity of service to  ensure the patient's safety. The patient's presenting symptoms, physical exam findings, and initial radiographic and laboratory data in the context of their chronic comorbidities is felt to place them at high risk for further clinical deterioration. Furthermore, it is not anticipated that the patient will be medically stable for discharge from the hospital within 2 midnights of admission.   * I certify that at the point of admission it is my clinical judgment that the patient will require inpatient hospital care spanning beyond 2 midnights from the point of admission due to high intensity of service, high risk for further deterioration and high frequency of surveillance required.*  Author: Jonnie Finner, DO 06/05/2022 7:41 AM  For on call review [email or web address].

## 2022-06-05 NOTE — Progress Notes (Signed)
Plan of Care Note for accepted transfer   Patient: Gloria [name] MRN: [id number]   DOA: 06/04/2022  Facility requesting transfer: [facility] ED. Requesting Provider: Dr. Eudelia Bunch, EDP. Reason for transfer: Multifocal pneumonia.  Facility course: The patient is a 88 year old female with past medical history significant for hypotension who presented to [facility] ED due to worsening productive cough and generalized weakness.    In the ED, she was noted to have a temperature of 99.9, leukocytosis of 16 K, multifocal infiltrates on chest x-ray to suggest multifocal pneumonia.  Not hypoxic.  Started on IV antibiotics empirically for CAP, Rocephin and azithromycin.  Recently treated for urinary tract infection.  Repeat UA was still positive for pyuria.  Covered with Rocephin.  Plan of care: The patient is accepted for admission to Telemetry unit, at [facility] as inpatient status.  Author: Darlin Drop, DO 06/05/2022  Check [email or web address] for on-call coverage.  Nursing staff, Please call [facility] Admits & Consults System-Wide number on Amion as soon as patient's arrival, so appropriate admitting provider can evaluate the pt.

## 2022-06-05 NOTE — Progress Notes (Signed)
Pt arrived to unit approx 0600. Pt alert and oriented, oriented to surroundings. [facility] notified of pt's arrival.

## 2022-06-05 NOTE — Progress Notes (Signed)
Came to assess pt for consult to RT order. Pt is on Ra sleeping at this time. She is in no distress. No wheezing present. I did add Prn breathing treatments so she will have them if she needs them at any point in her stay.

## 2022-06-05 NOTE — ED Provider Notes (Signed)
[facility] Provider Note  CSN: [id number] Arrival date & time: 06/04/22 2315  Chief Complaint(s) Cough, Weakness, and Nasal Congestion  HPI Gloria [name] is a 88 y.o. female with a past medical history listed below who presents to the emergency department with several days of productive cough of yellowish-green sputum as well as generalized fatigue.  No overt fevers.  No nausea or vomiting.  No chest pain or shortness of breath.  No abdominal pain.  No dysuria.  Family reports that the patient was recently treated for urinary tract infection and completed the antibiotics.  The history is provided by the patient and a relative.    Past Medical History Past Medical History:  Diagnosis Date   GERD 04/10/2007   Qualifier: Diagnosis of  By: Maris Berger    HYPERLIPIDEMIA 04/13/2007   Qualifier: Diagnosis of  By: Jonny Ruiz MD, Len Blalock    HYPERTENSION 04/10/2007   Qualifier: Diagnosis of  By: Maris Berger    Impaired glucose tolerance 09/17/2011   OSTEOPOROSIS 04/10/2007   Qualifier: Diagnosis of  By: Maris Berger    Unspecified vitamin D deficiency 08/26/2009   Centricity Description: UNSPECIFIED VITAMIN D DEFICIENCY Qualifier: Diagnosis of  By: Jonny Ruiz MD, Len Blalock  Centricity Description: VITAMIN D DEFICIENCY Qualifier: Diagnosis of  By: Jonny Ruiz MD, Len Blalock    Patient Active Problem List   Diagnosis Date Noted   Multifocal pneumonia 06/05/2022   Anemia in CKD (chronic kidney disease) 05/25/2022   Hypokalemia 05/25/2022   UTI (urinary tract infection) 05/25/2022   Iron deficiency anemia 05/25/2022   Nail disorder 05/25/2022   Bilateral leg edema 05/11/2022   NSTEMI (non-ST elevated myocardial infarction) (HCC)    Aortic atherosclerosis (HCC) 11/11/2020   Ascending aortic aneurysm (HCC) 09/27/2017   Heart murmur 03/26/2012   Impaired glucose tolerance 09/17/2011   Vitamin D deficiency 08/26/2009   Hyperlipidemia 04/13/2007   Essential  hypertension 04/10/2007   GERD 04/10/2007   Osteoporosis 04/10/2007   Home Medication(s) Prior to Admission medications   Medication Sig Start Date End Date Taking? Authorizing Provider  cephALEXin (KEFLEX) 500 MG capsule Take 1 capsule (500 mg total) by mouth 3 (three) times daily. 05/25/22   [name], MD  acetaminophen (TYLENOL) 325 MG tablet Take 2 tablets (650 mg total) by mouth every 6 (six) hours as needed for mild pain (or Fever >/= 101). 04/16/22   Almon Hercules, MD  feeding supplement (ENSURE ENLIVE / ENSURE PLUS) LIQD Take 237 mLs by mouth daily. 04/17/22   Almon Hercules, MD  fiber supplement, BANATROL TF, liquid Take 60 mLs by mouth 2 (two) times daily. 04/16/22   Almon Hercules, MD  furosemide (LASIX) 20 MG tablet Take 1 tablet (20 mg total) by mouth daily. 05/11/22   Georgina Quint, MD  metoprolol tartrate (LOPRESSOR) 25 MG tablet Take 0.5 tablets (12.5 mg total) by mouth 2 (two) times daily. 04/17/22 04/17/23  Kathlen Mody, MD  Nystatin (GERHARDT'S BUTT CREAM) CREA Apply 1 Application topically 2 (two) times daily. 04/16/22   Almon Hercules, MD  polyethylene glycol (MIRALAX / GLYCOLAX) 17 g packet Take 17 g by mouth 2 (two) times daily as needed for mild constipation. 04/16/22   Almon Hercules, MD  potassium chloride (KLOR-CON 10) 10 MEQ tablet Take 1 tablet (10 mEq total) by mouth 2 (two) times daily. 05/25/22   [name], MD  potassium chloride (KLOR-CON M) 10 MEQ tablet Take 1 tablet (  10 mEq total) by mouth daily. 05/11/22 07/10/22  Georgina Quint, MD  senna-docusate (SENOKOT-S) 8.6-50 MG tablet Take 1 tablet by mouth 2 (two) times daily as needed for moderate constipation. 04/16/22   Almon Hercules, MD                                                                                                                                    Allergies Patient has no known allergies.  Review of Systems Review of Systems As noted in HPI  Physical Exam Vital Signs  I have  reviewed the triage vital signs BP (!) 113/49 (BP Location: Right Arm)   Pulse 83   Temp 98 F (36.7 C) (Oral)   Resp 18   Ht 5\' 3"  (1.6 m)   Wt 57 kg   SpO2 97%   BMI 22.26 kg/m   Physical Exam Vitals reviewed.  Constitutional:      General: She is not in acute distress.    Appearance: She is well-developed. She is not diaphoretic.  HENT:     Head: Normocephalic and atraumatic.     Nose: Congestion present.     Mouth/Throat:     Pharynx: No posterior oropharyngeal erythema.     Tonsils: No tonsillar exudate.     Comments: Post nasal drip  Eyes:     General: No scleral icterus.       Right eye: No discharge.        Left eye: No discharge.     Conjunctiva/sclera: Conjunctivae normal.     Pupils: Pupils are equal, round, and reactive to light.  Cardiovascular:     Rate and Rhythm: Tachycardia present. Rhythm irregularly irregular.     Heart sounds: No murmur heard.    No friction rub. No gallop.  Pulmonary:     Effort: Pulmonary effort is normal. No respiratory distress.     Breath sounds: No stridor. Examination of the left-lower field reveals rales. Rales present.  Abdominal:     General: There is no distension.     Palpations: Abdomen is soft.     Tenderness: There is no abdominal tenderness.  Musculoskeletal:        General: No tenderness.     Cervical back: Normal range of motion and neck supple.  Skin:    General: Skin is warm and dry.     Findings: No erythema or rash.  Neurological:     Mental Status: She is alert and oriented to person, place, and time.     ED Results and Treatments Labs (all labs ordered are listed, but only abnormal results are displayed) Labs Reviewed  CBC WITH DIFFERENTIAL/PLATELET - Abnormal; Notable for the following components:      Result Value   WBC 16.4 (*)    RBC 2.87 (*)    Hemoglobin 9.0 (*)    HCT 26.7 (*)    Neutro Abs 12.8 (*)  Monocytes Absolute 1.3 (*)    Abs Immature Granulocytes 0.12 (*)    All other  components within normal limits  URINALYSIS, ROUTINE W REFLEX MICROSCOPIC - Abnormal; Notable for the following components:   APPearance HAZY (*)    Protein, ur TRACE (*)    Leukocytes,Ua MODERATE (*)    Bacteria, UA MANY (*)    All other components within normal limits  BASIC METABOLIC PANEL - Abnormal; Notable for the following components:   Sodium 131 (*)    Potassium 3.2 (*)    Chloride 96 (*)    Glucose, Bld 160 (*)    GFR, Estimated 57 (*)    All other components within normal limits  RESP PANEL BY RT-PCR (FLU A&B, COVID) ARPGX2  CULTURE, BLOOD (ROUTINE X 2)  CULTURE, BLOOD (ROUTINE X 2)  MRSA NEXT GEN BY PCR, NASAL  LACTIC ACID, PLASMA                                                                                                                         EKG  EKG Interpretation  Date/Time:  Sunday June 05 2022 00:54:58 EST Ventricular Rate:  93 PR Interval:  135 QRS Duration: 81 QT Interval:  356 QTC Calculation: 443 R Axis:   25 Text Interpretation: Sinus tachycardia Multiple premature complexes, vent & supraven RSR' in V1 or V2, right VCD or RVH When compared with ECG of 04/14/2022, Sinus rhythm has replaced Atrial flutter REPOLARIZATION ABNORMALITY has improved Confirmed by Dione Booze ([id number]) on 06/05/2022 5:11:43 AM       Radiology DG Chest 2 View  Result Date: 06/05/2022 CLINICAL DATA:  Cough and congestion for 1 week EXAM: CHEST - 2 VIEW COMPARISON:  04/09/2022 FINDINGS: Cardiac shadow is within normal limits. Aortic calcifications are seen. Lungs are well aerated bilaterally. Some left basilar airspace opacity is seen as well as mild opacity in the right upper lobe along the minor fissure. Small right-sided pleural effusion is seen as well. IMPRESSION: Patchy mild infiltrates in the right upper lobe and left base. Electronically Signed   By: Alcide Clever M.D.   On: 06/05/2022 01:28    Medications Ordered in ED Medications  cefTRIAXone (ROCEPHIN) 1 g in  sodium chloride 0.9 % 100 mL IVPB (0 g Intravenous Stopped 06/05/22 0355)  azithromycin (ZITHROMAX) 500 mg in sodium chloride 0.9 % 250 mL IVPB (0 mg Intravenous Stopped 06/05/22 0427)  Procedures Procedures  (including critical care time)  Medical Decision Making / ED Course   Medical Decision Making Amount and/or Complexity of Data Reviewed Labs: ordered. Decision-making details documented in ED Course. Radiology: ordered and independent interpretation performed. Decision-making details documented in ED Course. ECG/medicine tests: ordered and independent interpretation performed. Decision-making details documented in ED Course.  Risk Prescription drug management. Decision regarding hospitalization.    Patient presents with generalized fatigue and cough. Possible viral process but will rule out pneumonia. CBC with leukocytosis.  Anemia with stable hemoglobin. Metabolic panel with mild hyponatremia, mild hypokalemia, hyperglycemia without DKA.  No renal sufficiency. COVID/influenza negative. Chest x-ray is concerning for multilobar pneumonia in the right upper lobe and left lower lobes. Patient started on empiric antibiotics for community-acquired pneumonia.  Patient also noted to have irregular pulse on exam.  EKG obtained and showed normal sinus rhythm with frequent PACs.  Given patient's recent urinary tract infection and generalized fatigue, will recheck the urine to ensure resolution. UA is consistent with a urinary tract infection still.  Antibiotics given for pneumonia will also cover UTI.  Admitted to medicine for further management given her PORT score.  I spoke to Dr. Margo AyeHall from the hospitalist service who agreed to admit patient.       Final Clinical Impression(s) / ED Diagnoses Final diagnoses:  Multifocal pneumonia  Acute lower  UTI           This chart was dictated using voice recognition software.  Despite best efforts to proofread,  errors can occur which can change the documentation meaning.    Nira Connardama, Ritchie Klee Eduardo, MD 06/05/22 [phone]

## 2022-06-06 LAB — BASIC METABOLIC PANEL
Anion gap: 7 (ref 5–15)
BUN: 12 mg/dL (ref 8–23)
CO2: 23 mmol/L (ref 22–32)
Calcium: 8.4 mg/dL — ABNORMAL LOW (ref 8.9–10.3)
Chloride: 105 mmol/L (ref 98–111)
Creatinine, Ser: 0.77 mg/dL (ref 0.44–1.00)
GFR, Estimated: >60 mL/min (ref >60)
Glucose, Bld: 109 mg/dL — ABNORMAL HIGH (ref 70–99)
Potassium: 3.2 mmol/L — ABNORMAL LOW (ref 3.5–5.1)
Sodium: 135 mmol/L (ref 135–145)

## 2022-06-06 LAB — MAGNESIUM: Magnesium: 2 mg/dL (ref 1.7–2.4)

## 2022-06-06 LAB — CBC
HCT: 24 % — ABNORMAL LOW (ref 36.0–46.0)
Hemoglobin: 7.8 g/dL — ABNORMAL LOW (ref 12.0–15.0)
MCH: 31.3 pg (ref 26.0–34.0)
MCHC: 32.5 g/dL (ref 30.0–36.0)
MCV: 96.4 fL (ref 80.0–100.0)
Platelets: 278 10*3/uL (ref 150–400)
RBC: 2.49 MIL/uL — ABNORMAL LOW (ref 3.87–5.11)
RDW: 13.9 % (ref 11.5–15.5)
WBC: 11.7 10*3/uL — ABNORMAL HIGH (ref 4.0–10.5)
nRBC: 0 % (ref 0.0–0.2)

## 2022-06-06 MED ORDER — DILTIAZEM LOAD VIA INFUSION: 10.0000 mg | Freq: Once | INTRAVENOUS | Status: DC

## 2022-06-06 MED ORDER — POTASSIUM CHLORIDE 10 MEQ/100ML IV SOLN
10.0000 meq | INTRAVENOUS | Status: AC
  Administered 2022-06-06 (×3): 10 meq via INTRAVENOUS
  Filled 2022-06-06: qty 100

## 2022-06-06 MED ORDER — METOPROLOL TARTRATE 5 MG/5ML IV SOLN
10.0000 mg | Freq: Three times a day (TID) | INTRAVENOUS | Status: DC
  Administered 2022-06-06 – 2022-06-08 (×7): 10 mg via INTRAVENOUS
  Filled 2022-06-06 (×8): qty 10

## 2022-06-06 MED ORDER — DILTIAZEM HCL 25 MG/5ML IV SOLN
10.0000 mg | Freq: Once | INTRAVENOUS | Status: DC
  Filled 2022-06-06: qty 5

## 2022-06-06 NOTE — Progress Notes (Incomplete)
Hospitalist notified of tele report, pt had nonsustained atrial Tachycardia in the 150s with frequent PVCs. Pt denies any chest pian or palpitation. Continues frequent coughing.

## 2022-06-06 NOTE — Progress Notes (Signed)
Consultation Progress Note   Patient: Gloria [name] EVO:[id number] DOB: 10-Sep-1926 DOA: 06/04/2022 DOS: the patient was seen and examined on 06/06/2022 Primary service: Elpidio Thielen, Elgie Collard, MD  Brief hospital course:  Gloria [name] is a 88 y.o. female with medical history significant of HTN, AAA, GERD. Presenting with cough and congestion. Symptoms started 3 days ago. The cough has been productive. It has not responded to OTC meds. She has not have N/V/D or fever. She has become progressively weak during this time. When her symptoms did not improve last night, family decided to bring her to the hospital for evaluation. She denies any other aggravating or alleviating factors.    Assessment and Plan: Multifocal PNA -Continue Rocephin and Azithromycin. - COVID/flu negative  - check MRSA - fluids, guaifenesin   Hypokalemia Hyponatremia     - replace K+; check Mg2+     - Na+ mildly low; give fluids   Hyperglycemia     - check A1c   CKD 3a     - at baseline, follow   HTN     - her pressures are soft; hold home regimen today   Pyuria     - denies symptoms     - getting abx for MFPNA; should cover any UTI as well   ACD     - Hgb is at baseline, no evidence of bleed         [facility] will continue to follow the patient.  Subjective: Seen at bedside, no complaints this morning. Nephew at bedside. Continues to cough with yellowish sputum production  Physical Exam: General:  resting in bed in NAD Eyes: PERRL, normal sclera ENMT: Nares patent w/o discharge, orophaynx clear, dentition poor, ears w/o discharge/lesions/ulcers Neck: Supple, trachea midline Cardiovascular: RRR, +S1, S2, no g/r, 2/6 SEM, equal pulses throughout Respiratory: scattered rhonchi, no w/r, normal WOB GI: BS+, NDNT, no masses noted, no organomegaly noted MSK: No e/c/c Neuro: A&O x 3, other than HoH no focal deficits Psyc: Appropriate interaction and affect, calm/cooperative   Vitals:   06/05/22 1331  06/05/22 1816 06/05/22 2132 06/06/22 0449  BP: (!) 113/56 (!) 106/54 (!) 136/59 (!) 115/59  Pulse: 93 89 95 92  Resp: 20 20 16 16   Temp: 98.3 F (36.8 C) 99.3 F (37.4 C) 98.7 F (37.1 C) 98 F (36.7 C)  TempSrc: Oral Oral Oral Oral  SpO2: 96% 94% 91% 91%  Weight:      Height:       Data Reviewed:  There are no new results to review at this time.  Family Communication: Nephew  Time spent: 15 minutes.  Author: , MD 06/06/2022 12:00 PM  For on call review [email or web address].

## 2022-06-07 LAB — LEGIONELLA PNEUMOPHILA SEROGP 1 UR AG: L. pneumophila Serogp 1 Ur Ag: NEGATIVE

## 2022-06-07 MED ORDER — SALINE SPRAY 0.65 % NA SOLN
1.0000 | NASAL | Status: DC | PRN
  Filled 2022-06-07: qty 44

## 2022-06-07 NOTE — Progress Notes (Signed)
Consultation Progress Note   Patient: Gloria [name] PJA:[id number] DOB: Nov 28, 1926 DOA: 06/04/2022 DOS: the patient was seen and examined on 06/07/2022 Primary service: Kristianna Saperstein, Elgie Collard, MD  Brief hospital course:  Gloria [name] is a 88 y.o. female with medical history significant of HTN, AAA, GERD. Presenting with cough and congestion. Symptoms started 3 days ago. The cough has been productive. It has not responded to OTC meds. She has not have N/V/D or fever. She has become progressively weak during this time. When her symptoms did not improve last night, family decided to bring her to the hospital for evaluation. She denies any other aggravating or alleviating factors.  Patient is admitted for pneumonia.  She is on ceftriaxone and azithromycin.  Hospital stay was complicated by episode of atrial fibrillation with heart rates in the 160s to 180s.  She was administered metoprolol 10 mg every 6.  Heart rhythm is now in sinus.  Blood Pressure is stable as well.  She denies chest pain, shortness of breath, lightheadedness, palpitations.  Assessment and Plan: Atrial fibrillation -Yesterday patient had an episode of atrial fibrillation with heart rate as high as 180. EKG showed atrial fibrillation with RVR. She was started on metoprolol 10 mg every 6 hours. HR has significantly improved.  She  is in sinus rhythm as seen on telemetry.  Would consider switching to oral metoprolol succinate. Discussed case with cardiology, Holding off on a consult.   Multifocal PNA Clinically improving, WBC count improving -Continue Rocephin and Azithromycin. Blood cx and urine cx pending - COVID/flu negative  - check MRSA - fluids, guaifenesin   Hypokalemia-resolved Hyponatremia     - replace K+; check Mg2+     - Na+ mildly low; give fluids   Hyperglycemia -ssi   CKD 3a     - at baseline, follow   HTN     - her pressures are soft; hold home regimen today   Pyuria     - denies symptoms      - getting abx for MFPNA; should cover any UTI as well   ACD     - Hgb is at baseline, no evidence of bleed  Likely discharge tomorrow.     [facility] will continue to follow the patient.  Subjective: Feeling better this morning.  Physical Exam:  General:  resting in bed in NAD Eyes: PERRL, normal sclera ENMT: Nares patent w/o discharge, orophaynx clear, dentition poor, ears w/o discharge/lesions/ulcers Neck: Supple, trachea midline Cardiovascular: RRR, +S1, S2, no g/r, 2/6 SEM, equal pulses throughout Respiratory: scattered rhonchi, no w/r, normal WOB GI: BS+, NDNT, no masses noted, no organomegaly noted MSK: No e/c/c Neuro: A&O x 3, other than HoH no focal deficits Psyc: Appropriate interaction and affect, calm/cooperative   Data Reviewed:  There are no new results to review at this time.  Family Communication: Nephew at bedside  Time spent: 15 minutes.  Author: Baldomero Lamy, MD 06/07/2022 1:41 PM  For on call review [email or web address].

## 2022-06-07 NOTE — Progress Notes (Signed)
Mobility Specialist Cancellation/Refusal Note:  Pt declined mobility twice today. Will check back as schedule permits.     Gloria [name] Mobility Specialist  

## 2022-06-08 LAB — CBC
HCT: 24 % — ABNORMAL LOW (ref 36.0–46.0)
Hemoglobin: 7.6 g/dL — ABNORMAL LOW (ref 12.0–15.0)
MCH: 30.9 pg (ref 26.0–34.0)
MCHC: 31.7 g/dL (ref 30.0–36.0)
MCV: 97.6 fL (ref 80.0–100.0)
Platelets: 301 10*3/uL (ref 150–400)
RBC: 2.46 MIL/uL — ABNORMAL LOW (ref 3.87–5.11)
RDW: 14 % (ref 11.5–15.5)
WBC: 8.1 10*3/uL (ref 4.0–10.5)
nRBC: 0 % (ref 0.0–0.2)

## 2022-06-08 LAB — BASIC METABOLIC PANEL
Anion gap: 6 (ref 5–15)
BUN: 8 mg/dL (ref 8–23)
CO2: 23 mmol/L (ref 22–32)
Calcium: 8 mg/dL — ABNORMAL LOW (ref 8.9–10.3)
Chloride: 109 mmol/L (ref 98–111)
Creatinine, Ser: 0.71 mg/dL (ref 0.44–1.00)
GFR, Estimated: >60 mL/min (ref >60)
Glucose, Bld: 101 mg/dL — ABNORMAL HIGH (ref 70–99)
Potassium: 2.7 mmol/L — CL (ref 3.5–5.1)
Sodium: 138 mmol/L (ref 135–145)

## 2022-06-08 LAB — HEMOGLOBIN A1C
Hgb A1c MFr Bld: 4.8 % (ref 4.8–5.6)
Mean Plasma Glucose: 91 mg/dL

## 2022-06-08 LAB — POTASSIUM: Potassium: 5 mmol/L (ref 3.5–5.1)

## 2022-06-08 MED ORDER — POTASSIUM CHLORIDE 10 MEQ/100ML IV SOLN
10.0000 meq | INTRAVENOUS | Status: AC
  Administered 2022-06-08 (×5): 10 meq via INTRAVENOUS
  Filled 2022-06-08 (×4): qty 100

## 2022-06-08 MED ORDER — AZITHROMYCIN 250 MG PO TABS: 500.0000 mg | ORAL_TABLET | Freq: Every day | ORAL | Status: DC

## 2022-06-08 MED ORDER — POTASSIUM & SODIUM PHOSPHATES 280-160-250 MG PO PACK
2.0000 | PACK | Freq: Three times a day (TID) | ORAL | Status: DC
  Filled 2022-06-08 (×2): qty 2

## 2022-06-08 MED ORDER — POTASSIUM CHLORIDE 20 MEQ PO PACK
20.0000 meq | PACK | Freq: Once | ORAL | Status: AC
  Administered 2022-06-08: 20 meq via ORAL
  Filled 2022-06-08: qty 1

## 2022-06-08 MED ORDER — POTASSIUM CHLORIDE 20 MEQ PO PACK
40.0000 meq | PACK | Freq: Once | ORAL | Status: AC
  Administered 2022-06-08: 40 meq via ORAL
  Filled 2022-06-08: qty 2

## 2022-06-08 MED ORDER — CEPHALEXIN 500 MG PO CAPS: 500.0000 mg | ORAL_CAPSULE | Freq: Four times a day (QID) | ORAL | 0 refills | Status: AC

## 2022-06-08 NOTE — Discharge Summary (Signed)
Physician Discharge Summary   Patient: Gloria [name] MRN: [id number] DOB: 04-Nov-1926  Admit date:     06/04/2022  Discharge date: 06/08/22  Discharge Physician: Elgie Collard Annalia Metzger   PCP: [name], MD   Recommendations at discharge:    Follow up with PCP for repeat  labs to check for K levels.  Discharge Diagnoses: Principal Problem:   Multifocal pneumonia Active Problems:   Essential hypertension   Anemia of chronic disease   Hypokalemia   Stage 3a chronic kidney disease (CKD) (HCC)   Pyuria   Hyponatremia   Hyperglycemia  Resolved Problems:   * No resolved hospital problems. *  Hospital Course: Gloria [name] is a 88 y.o. female with medical history significant of HTN, AAA, GERD. Presenting with cough and congestion. Symptoms started 3 days ago. The cough has been productive. It has not responded to OTC meds. She has not have N/V/D or fever. She has become progressively weak during this time. When her symptoms did not improve last night, family decided to bring her to the hospital for evaluation. She denies any other aggravating or alleviating factors.  Patient is admitted for pneumonia.  She is on ceftriaxone and azithromycin.  Hospital stay was complicated by episode of atrial fibrillation with heart rates in the 160s to 180s.  She was administered metoprolol 10 mg every 6.  Heart rhythm is now in sinus.  Blood Pressure is stable as well.  She denies chest pain, shortness of breath, lightheadedness, palpitations.  Assessment and Plan: Paroxysmal Atrial fibrillation -Had an episode of RVR while admitted -Resolved with IV metoprolol. Continue PO metoprolol   Multifocal PNA Clinically improving, WBC count improving -Managed with Rocephin and Azithromycin. -Blood cx and urine cx  negative - COVID/flu negative  -  MRSA negative - guaifenesin   Hypokalemia- potassium at 2.7 today, receiving IV and oral replacement. Follow up with PCP for recheck   CKD 3a     -  at baseline, follow   HTN     - stable   Pyuria-Urine cx negativw     - denies symptoms     - getting abx for MFPNA; should cover any UTI as well   ACD     - Hgb is at baseline, no evidence of bleed         Pain control - [facility] Controlled Substance Reporting System database was reviewed. and patient was instructed, not to drive, operate heavy machinery, perform activities at heights, swimming or participation in water activities or provide baby-sitting services while on Pain, Sleep and Anxiety Medications; until their outpatient Physician has advised to do so again. Also recommended to not to take more than prescribed Pain, Sleep and Anxiety Medications.  Consultants:  Procedures performed:   Disposition: Home Diet recommendation:  Regular diet DISCHARGE MEDICATION: Allergies as of 06/08/2022   No Known Allergies      Medication List     TAKE these medications    acetaminophen 325 MG tablet Commonly known as: TYLENOL Take 2 tablets (650 mg total) by mouth every 6 (six) hours as needed for mild pain (or Fever >/= 101).   cephALEXin 500 MG capsule Commonly known as: KEFLEX Take 1 capsule (500 mg total) by mouth 4 (four) times daily for 3 days. What changed: when to take this   feeding supplement Liqd Take 237 mLs by mouth daily.   fiber supplement (BANATROL TF) liquid Take 60 mLs by mouth 2 (two) times daily.   furosemide  20 MG tablet Commonly known as: LASIX Take 1 tablet (20 mg total) by mouth daily.   Gerhardt's butt cream Crea Apply 1 Application topically 2 (two) times daily.   metoprolol tartrate 25 MG tablet Commonly known as: LOPRESSOR Take 0.5 tablets (12.5 mg total) by mouth 2 (two) times daily.   polyethylene glycol 17 g packet Commonly known as: MIRALAX / GLYCOLAX Take 17 g by mouth 2 (two) times daily as needed for mild constipation.   potassium chloride 10 MEQ tablet Commonly known as: KLOR-CON M Take 1 tablet (10 mEq total) by  mouth daily.   potassium chloride 10 MEQ tablet Commonly known as: Klor-Con 10 Take 1 tablet (10 mEq total) by mouth 2 (two) times daily.   senna-docusate 8.6-50 MG tablet Commonly known as: Senokot-S Take 1 tablet by mouth 2 (two) times daily as needed for moderate constipation.        Discharge Exam: Filed Weights   06/04/22 2328  Weight: 57 kg  General:  resting in bed in NAD Eyes: PERRL, normal sclera ENMT: Nares patent w/o discharge, orophaynx clear, dentition poor, ears w/o discharge/lesions/ulcers Neck: Supple, trachea midline Cardiovascular: RRR, +S1, S2, no g/r, 2/6 SEM, equal pulses throughout Respiratory: scattered rhonchi, no w/r, normal WOB GI: BS+, NDNT, no masses noted, no organomegaly noted MSK: No e/c/c Neuro: A&O x 3, other than HoH no focal deficits Psyc: Appropriate interaction and affect, calm/cooperative   Condition at discharge: good  The results of significant diagnostics from this hospitalization (including imaging, microbiology, ancillary and laboratory) are listed below for reference.   Imaging Studies: DG Chest 2 View  Result Date: 06/05/2022 CLINICAL DATA:  Cough and congestion for 1 week EXAM: CHEST - 2 VIEW COMPARISON:  04/09/2022 FINDINGS: Cardiac shadow is within normal limits. Aortic calcifications are seen. Lungs are well aerated bilaterally. Some left basilar airspace opacity is seen as well as mild opacity in the right upper lobe along the minor fissure. Small right-sided pleural effusion is seen as well. IMPRESSION: Patchy mild infiltrates in the right upper lobe and left base. Electronically Signed   By: Alcide Clever M.D.   On: 06/05/2022 01:28    Microbiology: Results for orders placed or performed during the hospital encounter of 06/04/22  Resp Panel by RT-PCR (Flu A&B, Covid) Anterior Nasal Swab     Status: None   Collection Time: 06/04/22 11:30 PM   Specimen: Anterior Nasal Swab  Result Value Ref Range Status   SARS Coronavirus  2 by RT PCR NEGATIVE NEGATIVE Final    Comment: (NOTE) SARS-CoV-2 target nucleic acids are NOT DETECTED.  The SARS-CoV-2 RNA is generally detectable in upper respiratory specimens during the acute phase of infection. The lowest concentration of SARS-CoV-2 viral copies this assay can detect is 138 copies/mL. A negative result does not preclude SARS-Cov-2 infection and should not be used as the sole basis for treatment or other patient management decisions. A negative result may occur with  improper specimen collection/handling, submission of specimen other than nasopharyngeal swab, presence of viral mutation(s) within the areas targeted by this assay, and inadequate number of viral copies(<138 copies/mL). A negative result must be combined with clinical observations, patient history, and epidemiological information. The expected result is Negative.  Fact Sheet for Patients:  BloggerCourse.com  Fact Sheet for Healthcare Providers:  SeriousBroker.it  This test is no t yet approved or cleared by the [place] FDA and  has been authorized for detection and/or diagnosis of SARS-CoV-2 by FDA under an Emergency  Use Authorization (EUA). This EUA will remain  in effect (meaning this test can be used) for the duration of the COVID-19 declaration under Section 564(b)(1) of the Act, 21 U.S.C.section 360bbb-3(b)(1), unless the authorization is terminated  or revoked sooner.       Influenza A by PCR NEGATIVE NEGATIVE Final   Influenza B by PCR NEGATIVE NEGATIVE Final    Comment: (NOTE) The Xpert Xpress SARS-CoV-2/FLU/RSV plus assay is intended as an aid in the diagnosis of influenza from Nasopharyngeal swab specimens and should not be used as a sole basis for treatment. Nasal washings and aspirates are unacceptable for Xpert Xpress SARS-CoV-2/FLU/RSV testing.  Fact Sheet for Patients: BloggerCourse.com  Fact  Sheet for Healthcare Providers: SeriousBroker.it  This test is not yet approved or cleared by the [place] FDA and has been authorized for detection and/or diagnosis of SARS-CoV-2 by FDA under an Emergency Use Authorization (EUA). This EUA will remain in effect (meaning this test can be used) for the duration of the COVID-19 declaration under Section 564(b)(1) of the Act, 21 U.S.C. section 360bbb-3(b)(1), unless the authorization is terminated or revoked.  Performed at [facility], [address] [place], [place] [address]   Blood culture (routine x 2)     Status: None (Preliminary result)   Collection Time: 06/05/22  2:35 AM   Specimen: BLOOD  Result Value Ref Range Status   Specimen Description   Final    BLOOD LEFT ANTECUBITAL Performed at [facility], [address] [place], [place] [address]    Special Requests   Final    BOTTLES DRAWN AEROBIC AND ANAEROBIC Blood Culture adequate volume Performed at [facility], [address] [place], [place] [address]    Culture   Final    NO GROWTH 3 DAYS Performed at [facility], [address], [place], [place] [address]    Report Status PENDING  Incomplete  Blood culture (routine x 2)     Status: None (Preliminary result)   Collection Time: 06/05/22  2:35 AM   Specimen: BLOOD  Result Value Ref Range Status   Specimen Description   Final    BLOOD RIGHT ANTECUBITAL Performed at [facility], [address], [place], [place] [address]    Special Requests   Final    BOTTLES DRAWN AEROBIC AND ANAEROBIC Blood Culture results may not be optimal due to an inadequate volume of blood received in culture bottles Performed at [facility], [address], [place], [place] [address]    Culture   Final    NO GROWTH 3 DAYS Performed at [facility], [address], [place], [place] [address]     Report Status PENDING  Incomplete  MRSA Next Gen by PCR, Nasal     Status: None   Collection Time: 06/05/22 12:30 PM   Specimen: Nasal Mucosa; Nasal Swab  Result Value Ref Range Status   MRSA by PCR Next Gen NOT DETECTED NOT DETECTED Final    Comment: (NOTE) The GeneXpert MRSA Assay (FDA approved for NASAL specimens only), is one component of a comprehensive MRSA colonization surveillance program. It is not intended to diagnose MRSA infection nor to guide or monitor treatment for MRSA infections. Test performance is not FDA approved in patients less than 46 years old. Performed at [facility], [address], [place], [place] [address]     Labs: CBC: Recent Labs  Lab 06/05/22 0044 06/06/22 0526 06/08/22 0634  WBC  16.4* 11.7* 8.1  NEUTROABS 12.8*  --   --   HGB 9.0* 7.8* 7.6*  HCT 26.7* 24.0* 24.0*  MCV 93.0 96.4 97.6  PLT 302 278 301   Basic Metabolic Panel: Recent Labs  Lab 06/05/22 0125 06/05/22 2002 06/06/22 0526 06/08/22 0634  NA 131*  --  135 138  K 3.2*  --  3.2* 2.7*  CL 96*  --  105 109  CO2 24  --  23 23  GLUCOSE 160*  --  109* 101*  BUN 16  --  12 8  CREATININE 0.93  --  0.77 0.71  CALCIUM 8.9  --  8.4* 8.0*  MG  --  1.9 2.0  --    Liver Function Tests: No results for input(s): "AST", "ALT", "ALKPHOS", "BILITOT", "PROT", "ALBUMIN" in the last 168 hours. CBG: No results for input(s): "GLUCAP" in the last 168 hours.  Discharge time spent: greater than 30 minutes.  Signed: Baldomero LamyPauline E Lilac Hoff, MD [facility] 06/08/2022

## 2022-06-08 NOTE — Consult Note (Signed)
   [facility] Inpatient Consult   06/08/2022  Gloria [name] Jun 18, 1927 [id number]  [facility] [[facility]]  Accountable Care Organization [ACO] Patient: [employer]  *[facility] RN Hospital Liaison remote coverage review for patient at [facility]  Primary Care Provider:  [name], MD, [place] at [facility]  Patient screened for hospitalization with noted to assess for potential [facility]  [[facility]] Care Management service needs for post hospital transition for care coordination.  Review of patient's electronic medical record reveals patient is for home noted today.  Call attempted to reach out to patient for post hospital follow up needs. No answer and unable to leave a voice mail message.   Plan:  Referral request for community care coordination: no need assessed.  Of note, [facility] Care Management/Population Health does not replace or interfere with any arrangements made by the Inpatient Transition of Care team.  For questions contact:   Charlesetta Shanks, RN BSN CCM [facility]  [phone] business mobile phone Toll free office [phone]  *Concierge Line  [phone] Fax number: [phone] [name]

## 2022-06-08 NOTE — Care Management Important Message (Signed)
Important Message  Patient Details IM Letter placed in Patient's room. Name: Gloria [name] MRN: [id number] Date of Birth: 11/22/1926   Medicare Important Message Given:  Yes     Caren Macadam 06/08/2022, 9:26 AM

## 2022-06-08 NOTE — Plan of Care (Signed)

## 2022-06-08 NOTE — Progress Notes (Signed)
Mobility Specialist Cancellation/Refusal Note:  Pt declined mobility twice today. Will check back as schedule permits.     Maya Christene Pounds Mobility Specialist  

## 2022-06-09 ENCOUNTER — Encounter: Payer: Self-pay | Admitting: *Deleted

## 2022-06-09 ENCOUNTER — Telehealth: Payer: Self-pay | Admitting: *Deleted

## 2022-06-09 ENCOUNTER — Telehealth: Payer: Self-pay | Admitting: Internal Medicine

## 2022-06-09 NOTE — Telephone Encounter (Signed)
Patient is on lasix - has been since October - swelling has disappeared - patients family wants to know if she can go off the lasix so her potassium level will stay up.  They have an appointment on 06/21/2022.  Please advise.

## 2022-06-09 NOTE — Telephone Encounter (Signed)
No  - ok to continue all medications as listed on the hospital d/c summary.  Please do not change this on his own, and f/u as recommended with this office

## 2022-06-09 NOTE — Telephone Encounter (Signed)
Please advise of ok to stop Lasix in order to help with potassium.

## 2022-06-09 NOTE — Patient Outreach (Signed)
Care Coordination TOC Note Transition Care Management Follow-up Telephone Call Date of discharge and from where: Wednesday, 06/08/22 [facility]- pneumonia/ cough, congestion, weakness, hypokalemia How have you been since you were released from the hospital? Per Keith/ family caregiver, on [facility] DPR: "She is still weak and still coughing a little bit, but otherwise doing okay.  I put a call into Dr. Jonny Ruiz this morning asking him whether or not I should give the Lasix, because I was told it can make the potassium levels low-- and she had to have a lot of potassium while she was in the hospital-- I do not want her potassium falling again, and causing her to have to go back to the hospital.... and her swelling is down.... I just want to know whether or not to give the Lasix, and I would like to know sooner than later.  We are giving her the supplemental potassium.  Other than that, everything is fine, and she is okay.  We are helping her with anything she needs, she requires assistance with most things, but she has been able to get up today and is walking okay with her walker.  Please tell Dr. Jonny Ruiz to let me know as soon as possible whether or not I should give her the Lasix" Any questions or concerns? Yes- caregiver would like prompt answer today, regarding his questions around whether or not to give Lasix in setting of recent hypokalemia -- discussed and provided education around purpose of diuretic and early vs. late signs of fluid retention; confirmed patient taking supplemental potassium and provided education that supplemental potassium should prevent patient from becoming hypokalemic again; provided education around value of daily weight monitoring in setting of recent fluid retention- caregiver declines daily weight monitoring -- communicated with PCP via instant messaging through EHR and received immediate response back from PCP to continue lasix as instructed post-hospital discharge: reached back out to  caregiver and updated him on PCP advice to follow hospital discharge instructions and keep post-discharge PCP office appointment  Items Reviewed: Did the pt receive and understand the discharge instructions provided? Yes  Medications obtained and verified? Yes  Other? No  Any new allergies since your discharge? No  Dietary orders reviewed? Yes Do you have support at home? Yes  family caregivers assist with all care needs as indicated  Home Care and Equipment/Supplies: Were home health services ordered? no If so, what is the name of the agency? N/A  Has the agency set up a time to come to the patient's home? not applicable Were any new equipment or medical supplies ordered?  No What is the name of the medical supply agency? N/A Were you able to get the supplies/equipment? not applicable Do you have any questions related to the use of the equipment or supplies? No N/A  Functional Questionnaire: (I = Independent and D = Dependent) ADLs: I  family caregivers assist with all care needs as indicated  Bathing/Dressing- I  family caregivers assist with all care needs as indicated  Meal Prep- D  family caregivers prepare all meals  Eating- I  Maintaining continence- I  family caregivers assist with all care needs as indicated  Transferring/Ambulation- I  family caregivers assist with all care needs as indicated  Managing Meds- D  family caregivers manage all aspects of medications administration  Follow up appointments reviewed:  PCP Hospital f/u appt confirmed? Yes  Scheduled to see PCP Dr. Jonny Ruiz on Tuesday, 06/21/22 @ 10:00 am Specialist Hospital f/u appt confirmed? No  Scheduled to see - on - @ - Are transportation arrangements needed? No  If their condition worsens, is the pt aware to call PCP or go to the Emergency Dept.? Yes Was the patient provided with contact information for the PCP's office or ED? No- caregiver declined; states he already has numbers Was to pt encouraged to  call back with questions or concerns? Yes  SDOH assessments and interventions completed:   Yes SDOH Interventions Today    Flowsheet Row Most Recent Value  SDOH Interventions   Food Insecurity Interventions Intervention Not Indicated  Transportation Interventions Intervention Not Indicated  [family provides transportation]      Care Coordination Interventions:  Provided education around fluid retention and use/ purpose of diuretics and supplemental potassium; care coordination outreach to PCP via instant messaging through EHR to advise on post-discharge medication instructions; provided education around value of daily weight monitoring at home and late vs. Early signs of fluid retention      Encounter Outcome:  Pt. Visit Completed    Caryl Pina, RN, BSN, CCRN Alumnus RN CM Care Coordination/ Transition of Care- [facility] Care Management [phone]: direct office

## 2022-06-10 LAB — CULTURE, BLOOD (ROUTINE X 2)
Culture: NO GROWTH
Culture: NO GROWTH
Special Requests: ADEQUATE

## 2022-06-10 NOTE — Telephone Encounter (Signed)
TOC call completed by Orest Dikes

## 2022-06-21 ENCOUNTER — Ambulatory Visit (INDEPENDENT_AMBULATORY_CARE_PROVIDER_SITE_OTHER): Payer: PPO | Admitting: Internal Medicine

## 2022-06-21 VITALS — BP 120/68 | HR 72 | Temp 97.8°F | Ht 63.0 in | Wt 119.0 lb

## 2022-06-21 DIAGNOSIS — N3000 Acute cystitis without hematuria: Secondary | ICD-10-CM

## 2022-06-21 DIAGNOSIS — E876 Hypokalemia: Secondary | ICD-10-CM | POA: Diagnosis not present

## 2022-06-21 DIAGNOSIS — R600 Localized edema: Secondary | ICD-10-CM

## 2022-06-21 DIAGNOSIS — I214 Non-ST elevation (NSTEMI) myocardial infarction: Secondary | ICD-10-CM

## 2022-06-21 DIAGNOSIS — D509 Iron deficiency anemia, unspecified: Secondary | ICD-10-CM | POA: Diagnosis not present

## 2022-06-21 DIAGNOSIS — I350 Nonrheumatic aortic (valve) stenosis: Secondary | ICD-10-CM

## 2022-06-21 LAB — CBC WITH DIFFERENTIAL/PLATELET
Basophils Absolute: 0.1 10*3/uL (ref 0.0–0.1)
Basophils Relative: 0.8 % (ref 0.0–3.0)
Eosinophils Absolute: 0.1 10*3/uL (ref 0.0–0.7)
Eosinophils Relative: 1.1 % (ref 0.0–5.0)
HCT: 31.2 % — ABNORMAL LOW (ref 36.0–46.0)
Hemoglobin: 10.5 g/dL — ABNORMAL LOW (ref 12.0–15.0)
Lymphocytes Relative: 32.8 % (ref 12.0–46.0)
Lymphs Abs: 2.8 10*3/uL (ref 0.7–4.0)
MCHC: 33.6 g/dL (ref 30.0–36.0)
MCV: 92.6 fl (ref 78.0–100.0)
Monocytes Absolute: 0.6 10*3/uL (ref 0.1–1.0)
Monocytes Relative: 6.8 % (ref 3.0–12.0)
Neutro Abs: 5.1 10*3/uL (ref 1.4–7.7)
Neutrophils Relative %: 58.5 % (ref 43.0–77.0)
Platelets: 390 10*3/uL (ref 150.0–400.0)
RBC: 3.37 Mil/uL — ABNORMAL LOW (ref 3.87–5.11)
RDW: 15.5 % (ref 11.5–15.5)
WBC: 8.7 10*3/uL (ref 4.0–10.5)

## 2022-06-21 LAB — BASIC METABOLIC PANEL
BUN: 20 mg/dL (ref 6–23)
CO2: 30 mEq/L (ref 19–32)
Calcium: 10 mg/dL (ref 8.4–10.5)
Chloride: 100 mEq/L (ref 96–112)
Creatinine, Ser: 0.86 mg/dL (ref 0.40–1.20)
GFR: 58.19 mL/min — ABNORMAL LOW (ref >60.00)
Glucose, Bld: 104 mg/dL — ABNORMAL HIGH (ref 70–99)
Potassium: 3.9 mEq/L (ref 3.5–5.1)
Sodium: 137 mEq/L (ref 135–145)

## 2022-06-21 MED ORDER — NYSTATIN 100000 UNIT/GM EX CREA: 1.0000 | TOPICAL_CREAM | Freq: Two times a day (BID) | CUTANEOUS | 2 refills | Status: AC

## 2022-06-21 NOTE — Progress Notes (Unsigned)
Patient ID: BRIE GOWELL, female   DOB: 1926-10-10, 88 y.o.   MRN: [id number]        Chief Complaint: follow up with family right decubitus ulcer, pedal edema anemia,, low K, UTI, CAP, moderate aortic stenosis       HPI:  KLEA MARSLAND is a 88 y.o. female here with duaghter and son in law, overall doing ok with improved stamina and gait, resolved right buttock decubitus, resolved pedal edema, Denies urinary symptoms such as dysuria, frequency, urgency, flank pain, hematuria or n/v, fever, chills.  No overt bleeding or bruising.  Had recent echo with mod aortic stenosis and CAP now with mild doe but Pt denies chest pain, wheezing, orthopnea, PND, increased LE swelling, palpitations, or syncope.         Wt Readings from Last 3 Encounters:  06/21/22 119 lb (54 kg)  06/04/22 125 lb 10.6 oz (57 kg)  05/25/22 126 lb (57.2 kg)   BP Readings from Last 3 Encounters:  06/21/22 120/68  06/08/22 (!) 158/83  05/25/22 120/68         Past Medical History:  Diagnosis Date   GERD 04/10/2007   Qualifier: Diagnosis of  By: Elveria Royals    HYPERLIPIDEMIA 04/13/2007   Qualifier: Diagnosis of  By: Jenny Reichmann MD, Hunt Oris    HYPERTENSION 04/10/2007   Qualifier: Diagnosis of  By: Elveria Royals    Impaired glucose tolerance 09/17/2011   OSTEOPOROSIS 04/10/2007   Qualifier: Diagnosis of  By: Elveria Royals    Unspecified vitamin D deficiency 08/26/2009   Centricity Description: UNSPECIFIED VITAMIN D DEFICIENCY Qualifier: Diagnosis of  By: Jenny Reichmann MD, Hunt Oris  Centricity Description: VITAMIN D DEFICIENCY Qualifier: Diagnosis of  By: Jenny Reichmann MD, Hunt Oris    Past Surgical History:  Procedure Laterality Date   CATARACT EXTRACTION      reports that she has never smoked. She has never used smokeless tobacco. She reports that she does not drink alcohol and does not use drugs. family history includes Heart disease in her father; Stroke in her sister. No Known Allergies Current Outpatient  Medications on File Prior to Visit  Medication Sig Dispense Refill   acetaminophen (TYLENOL) 325 MG tablet Take 2 tablets (650 mg total) by mouth every 6 (six) hours as needed for mild pain (or Fever >/= 101).     furosemide (LASIX) 20 MG tablet Take 1 tablet (20 mg total) by mouth daily. 30 tablet 3   metoprolol tartrate (LOPRESSOR) 25 MG tablet Take 0.5 tablets (12.5 mg total) by mouth 2 (two) times daily. 30 tablet 11   Nystatin (GERHARDT'S BUTT CREAM) CREA Apply 1 Application topically 2 (two) times daily.     polyethylene glycol (MIRALAX / GLYCOLAX) 17 g packet Take 17 g by mouth 2 (two) times daily as needed for mild constipation. 14 each 0   potassium chloride (KLOR-CON M) 10 MEQ tablet Take 1 tablet (10 mEq total) by mouth daily. 30 tablet 1   senna-docusate (SENOKOT-S) 8.6-50 MG tablet Take 1 tablet by mouth 2 (two) times daily as needed for moderate constipation.     feeding supplement (ENSURE ENLIVE / ENSURE PLUS) LIQD Take 237 mLs by mouth daily. (Patient not taking: Reported on 06/05/2022) 237 mL 12   fiber supplement, BANATROL TF, liquid Take 60 mLs by mouth 2 (two) times daily. (Patient not taking: Reported on 06/05/2022)     potassium chloride (KLOR-CON 10) 10 MEQ tablet Take 1 tablet (10 mEq total) by mouth  2 (two) times daily. (Patient not taking: Reported on 06/21/2022) 60 tablet 11   No current facility-administered medications on file prior to visit.        ROS:  All others reviewed and negative.  Objective        PE:  BP 120/68 (BP Location: Left Arm, Patient Position: Sitting, Cuff Size: Normal)   Pulse 72   Temp 97.8 F (36.6 C) (Oral)   Ht 5\' 3"  (1.6 m)   Wt 119 lb (54 kg)   SpO2 95%   BMI 21.08 kg/m                 Constitutional: Pt appears in NAD               HENT: Head: NCAT.                Right Ear: External ear normal.                 Left Ear: External ear normal.                Eyes: . Pupils are equal, round, and reactive to light. Conjunctivae and  EOM are normal               Nose: without d/c or deformity               Neck: Neck supple. Gross normal ROM               Cardiovascular: Normal rate and regular rhythm.  Gr 2-3/6 syst murmur               Pulmonary/Chest: Effort normal and breath sounds without rales or wheezing.                Abd:  Soft, NT, ND, + BS, no organomegaly               Neurological: Pt is alert. At baseline orientation, motor grossly intact               Skin: Skin is warm. No rashes, no other new lesions including right buttock, LE edema - none               Psychiatric: Pt behavior is normal without agitation   Micro: none  Cardiac tracings I have personally interpreted today:  none  Pertinent Radiological findings (summarize): none   Lab Results  Component Value Date   WBC 8.7 06/21/2022   HGB 10.5 (L) 06/21/2022   HCT 31.2 (L) 06/21/2022   PLT 390.0 06/21/2022   GLUCOSE 104 (H) 06/21/2022   CHOL 143 11/16/2021   TRIG 115.0 11/16/2021   HDL 54.80 11/16/2021   LDLCALC 65 11/16/2021   ALT 22 05/25/2022   AST 25 05/25/2022   NA 137 06/21/2022   K 3.9 06/21/2022   CL 100 06/21/2022   CREATININE 0.86 06/21/2022   BUN 20 06/21/2022   CO2 30 06/21/2022   TSH 3.35 11/16/2021   INR 1.0 04/09/2022   HGBA1C 4.8 06/05/2022   Assessment/Plan:  CHERYLN BALCOM is a 88 y.o. White or Caucasian [1] female with  has a past medical history of GERD (04/10/2007), HYPERLIPIDEMIA (04/13/2007), HYPERTENSION (04/10/2007), Impaired glucose tolerance (09/17/2011), OSTEOPOROSIS (04/10/2007), and Unspecified vitamin D deficiency (08/26/2009).  Bilateral leg edema Currently controlled/resolved,, but ok to change lasix to 20 mg prn only with AM wt check daily  Hypokalemia Mild, for K supplement only if takes lasix prn  Iron deficiency anemia No recent overt bleeding, for iron lab f/u, cbc  Moderate aortic stenosis Per recent echo, ok for cardiology referral to establish  UTI (urinary tract infection) Clinically  resolved, family asks for f/u Urine culture  Followup: Return in about 4 weeks (around 07/19/2022).  Cathlean Cower, MD 06/23/2022 8:36 PM [facility]

## 2022-06-21 NOTE — Patient Instructions (Signed)
Please continue all other medications as before, and refills have been done if requested - the cream  The seat cushion is a good idea  Ok to HOLD on taking the lasix, but if the weight in the AM goes up 3-5 lbs and feet swelling recurs, you should restart as needed and take a potassium pill x 1 with it  Please have the pharmacy call with any other refills you may need.  You will be contacted regarding the referral for: Cardiology  Please keep your appointments with your specialists as you may have planned  Please go to the LAB at the blood drawing area for the tests to be done  You will be contacted by phone if any changes need to be made immediately.  Otherwise, you will receive a letter about your results with an explanation, but please check with MyChart first.  Please remember to sign up for MyChart if you have not done so, as this will be important to you in the future with finding out test results, communicating by private email, and scheduling acute appointments online when needed.  Please make an Appointment to return in 1 months, or sooner if needed

## 2022-06-23 ENCOUNTER — Encounter: Payer: Self-pay | Admitting: Internal Medicine

## 2022-06-23 NOTE — Assessment & Plan Note (Signed)
Per recent echo, ok for cardiology referral to establish

## 2022-06-23 NOTE — Assessment & Plan Note (Addendum)
Currently controlled/resolved,, but ok to change lasix to 20 mg prn only with AM wt check daily

## 2022-06-23 NOTE — Assessment & Plan Note (Signed)
No recent overt bleeding, for iron lab f/u, cbc

## 2022-06-23 NOTE — Assessment & Plan Note (Signed)
Mild, for K supplement only if takes lasix prn

## 2022-06-23 NOTE — Assessment & Plan Note (Signed)
Clinically resolved, family asks for f/u Urine culture

## 2022-06-25 ENCOUNTER — Other Ambulatory Visit: Payer: Self-pay | Admitting: Internal Medicine

## 2022-06-25 LAB — URINE CULTURE

## 2022-06-25 MED ORDER — NITROFURANTOIN MACROCRYSTAL 50 MG PO CAPS: 50.0000 mg | ORAL_CAPSULE | Freq: Four times a day (QID) | ORAL | 0 refills | Status: DC

## 2022-06-28 DIAGNOSIS — D72825 Bandemia: Secondary | ICD-10-CM | POA: Diagnosis not present

## 2022-06-28 DIAGNOSIS — I10 Essential (primary) hypertension: Secondary | ICD-10-CM | POA: Diagnosis not present

## 2022-06-28 DIAGNOSIS — K219 Gastro-esophageal reflux disease without esophagitis: Secondary | ICD-10-CM | POA: Diagnosis not present

## 2022-06-28 DIAGNOSIS — M6282 Rhabdomyolysis: Secondary | ICD-10-CM | POA: Diagnosis not present

## 2022-06-28 DIAGNOSIS — K5900 Constipation, unspecified: Secondary | ICD-10-CM | POA: Diagnosis not present

## 2022-06-28 DIAGNOSIS — N179 Acute kidney failure, unspecified: Secondary | ICD-10-CM | POA: Diagnosis not present

## 2022-06-28 DIAGNOSIS — M6281 Muscle weakness (generalized): Secondary | ICD-10-CM | POA: Diagnosis not present

## 2022-06-28 DIAGNOSIS — R34 Anuria and oliguria: Secondary | ICD-10-CM | POA: Diagnosis not present

## 2022-06-28 DIAGNOSIS — I214 Non-ST elevation (NSTEMI) myocardial infarction: Secondary | ICD-10-CM | POA: Diagnosis not present

## 2022-06-28 DIAGNOSIS — K529 Noninfective gastroenteritis and colitis, unspecified: Secondary | ICD-10-CM | POA: Diagnosis not present

## 2022-07-08 ENCOUNTER — Telehealth: Payer: Self-pay | Admitting: Internal Medicine

## 2022-07-08 NOTE — Telephone Encounter (Signed)
Next office visit is 07/21/22, should she be seen sooner?

## 2022-07-08 NOTE — Telephone Encounter (Signed)
Needs ROV soon, or UC or ED if none available

## 2022-07-08 NOTE — Telephone Encounter (Signed)
Appointment scheduled for 1/4 at 10:40 with Lawerance Bach

## 2022-07-08 NOTE — Telephone Encounter (Signed)
Patient's nephew/care giver called and said that his aunt is acting out of character - he thinks that it may be due to the medication (antibiotic) could be causing this.  She is seeing things, and hearing things that are not there.

## 2022-07-14 ENCOUNTER — Ambulatory Visit: Payer: PPO | Admitting: Internal Medicine

## 2022-07-21 ENCOUNTER — Other Ambulatory Visit: Payer: Self-pay | Admitting: Internal Medicine

## 2022-07-21 ENCOUNTER — Ambulatory Visit (INDEPENDENT_AMBULATORY_CARE_PROVIDER_SITE_OTHER): Payer: PPO | Admitting: Internal Medicine

## 2022-07-21 VITALS — BP 128/74 | HR 64 | Temp 97.6°F | Ht 63.0 in | Wt 123.0 lb

## 2022-07-21 DIAGNOSIS — N1831 Chronic kidney disease, stage 3a: Secondary | ICD-10-CM | POA: Diagnosis not present

## 2022-07-21 DIAGNOSIS — R7302 Impaired glucose tolerance (oral): Secondary | ICD-10-CM | POA: Diagnosis not present

## 2022-07-21 DIAGNOSIS — Z0001 Encounter for general adult medical examination with abnormal findings: Secondary | ICD-10-CM | POA: Diagnosis not present

## 2022-07-21 DIAGNOSIS — E538 Deficiency of other specified B group vitamins: Secondary | ICD-10-CM | POA: Diagnosis not present

## 2022-07-21 DIAGNOSIS — D638 Anemia in other chronic diseases classified elsewhere: Secondary | ICD-10-CM

## 2022-07-21 DIAGNOSIS — E559 Vitamin D deficiency, unspecified: Secondary | ICD-10-CM | POA: Diagnosis not present

## 2022-07-21 DIAGNOSIS — I10 Essential (primary) hypertension: Secondary | ICD-10-CM

## 2022-07-21 DIAGNOSIS — N3000 Acute cystitis without hematuria: Secondary | ICD-10-CM

## 2022-07-21 DIAGNOSIS — D509 Iron deficiency anemia, unspecified: Secondary | ICD-10-CM

## 2022-07-21 DIAGNOSIS — Z23 Encounter for immunization: Secondary | ICD-10-CM

## 2022-07-21 DIAGNOSIS — E7800 Pure hypercholesterolemia, unspecified: Secondary | ICD-10-CM | POA: Diagnosis not present

## 2022-07-21 LAB — CBC WITH DIFFERENTIAL/PLATELET
Basophils Absolute: 0.1 10*3/uL (ref 0.0–0.1)
Basophils Relative: 0.6 % (ref 0.0–3.0)
Eosinophils Absolute: 0.1 10*3/uL (ref 0.0–0.7)
Eosinophils Relative: 1.2 % (ref 0.0–5.0)
HCT: 35.6 % — ABNORMAL LOW (ref 36.0–46.0)
Hemoglobin: 12 g/dL (ref 12.0–15.0)
Lymphocytes Relative: 24 % (ref 12.0–46.0)
Lymphs Abs: 2.3 10*3/uL (ref 0.7–4.0)
MCHC: 33.6 g/dL (ref 30.0–36.0)
MCV: 90.6 fl (ref 78.0–100.0)
Monocytes Absolute: 0.6 10*3/uL (ref 0.1–1.0)
Monocytes Relative: 6.5 % (ref 3.0–12.0)
Neutro Abs: 6.6 10*3/uL (ref 1.4–7.7)
Neutrophils Relative %: 67.7 % (ref 43.0–77.0)
Platelets: 409 10*3/uL — ABNORMAL HIGH (ref 150.0–400.0)
RBC: 3.93 Mil/uL (ref 3.87–5.11)
RDW: 14.7 % (ref 11.5–15.5)
WBC: 9.8 10*3/uL (ref 4.0–10.5)

## 2022-07-21 LAB — TSH: TSH: 4.72 u[IU]/mL (ref 0.35–5.50)

## 2022-07-21 LAB — VITAMIN B12: Vitamin B-12: 277 pg/mL (ref 211–911)

## 2022-07-21 LAB — BASIC METABOLIC PANEL
BUN: 22 mg/dL (ref 6–23)
CO2: 28 mEq/L (ref 19–32)
Calcium: 9.7 mg/dL (ref 8.4–10.5)
Chloride: 103 mEq/L (ref 96–112)
Creatinine, Ser: 0.86 mg/dL (ref 0.40–1.20)
GFR: 58.15 mL/min — ABNORMAL LOW (ref >60.00)
Glucose, Bld: 93 mg/dL (ref 70–99)
Potassium: 4.4 mEq/L (ref 3.5–5.1)
Sodium: 138 mEq/L (ref 135–145)

## 2022-07-21 LAB — URINALYSIS, ROUTINE W REFLEX MICROSCOPIC
Bilirubin Urine: NEGATIVE
Hgb urine dipstick: NEGATIVE
Ketones, ur: NEGATIVE
Nitrite: NEGATIVE
Specific Gravity, Urine: 1.01 (ref 1.000–1.030)
Total Protein, Urine: NEGATIVE
Urine Glucose: NEGATIVE
Urobilinogen, UA: 0.2 (ref 0.0–1.0)
pH: 6 (ref 5.0–8.0)

## 2022-07-21 LAB — HEMOGLOBIN A1C: Hgb A1c MFr Bld: 5.2 % (ref 4.6–6.5)

## 2022-07-21 LAB — LIPID PANEL
Cholesterol: 246 mg/dL — ABNORMAL HIGH (ref 0–200)
HDL: 50.8 mg/dL (ref >39.00)
LDL Cholesterol: 164 mg/dL — ABNORMAL HIGH (ref 0–99)
NonHDL: 194.94
Total CHOL/HDL Ratio: 5
Triglycerides: 156 mg/dL — ABNORMAL HIGH (ref 0.0–149.0)
VLDL: 31.2 mg/dL (ref 0.0–40.0)

## 2022-07-21 LAB — VITAMIN D 25 HYDROXY (VIT D DEFICIENCY, FRACTURES): VITD: 24.7 ng/mL — ABNORMAL LOW (ref 30.00–100.00)

## 2022-07-21 LAB — HEPATIC FUNCTION PANEL
ALT: 16 U/L (ref 0–35)
AST: 19 U/L (ref 0–37)
Albumin: 3.9 g/dL (ref 3.5–5.2)
Alkaline Phosphatase: 69 U/L (ref 39–117)
Bilirubin, Direct: 0.1 mg/dL (ref 0.0–0.3)
Total Bilirubin: 0.7 mg/dL (ref 0.2–1.2)
Total Protein: 7.6 g/dL (ref 6.0–8.3)

## 2022-07-21 LAB — MICROALBUMIN / CREATININE URINE RATIO
Creatinine,U: 45.5 mg/dL
Microalb Creat Ratio: 2.3 mg/g (ref 0.0–30.0)
Microalb, Ur: 1 mg/dL (ref 0.0–1.9)

## 2022-07-21 MED ORDER — ROSUVASTATIN CALCIUM 40 MG PO TABS: 40.0000 mg | ORAL_TABLET | Freq: Every day | ORAL | 3 refills | Status: DC

## 2022-07-21 NOTE — Patient Instructions (Signed)
You had the flu shot today  Please continue all other medications as before, and refills have been done if requested.  Please have the pharmacy call with any other refills you may need.  Please continue your efforts at being more active, low cholesterol diet, and weight control.  You are otherwise up to date with prevention measures today.  Please keep your appointments with your specialists as you may have planned  .Please go to the LAB at the blood drawing area for the tests to be done  You will be contacted by phone if any changes need to be made immediately.  Otherwise, you will receive a letter about your results with an explanation, but please check with MyChart first.  Please remember to sign up for MyChart if you have not done so, as this will be important to you in the future with finding out test results, communicating by private email, and scheduling acute appointments online when needed.  Please make an Appointment to return in 6 months, or sooner if needed  

## 2022-07-21 NOTE — Progress Notes (Signed)
Patient ID: Gloria [name], female   DOB: December 27, 1926, 89 y.o.   MRN: [id number]         Chief Complaint:: wellness exam and uti, anemia, buttock wound, htn, hld, hyperglycemia       HPI:  Gloria [name] is a 89 y.o. female here for wellness exam; for flu shot today, o/w up to date                        Also Denies urinary symptoms such as dysuria, frequency, urgency, flank pain, hematuria or n/v, fever, chills.  No overt bleeding, bruising.  No worsening buttock wound. Pt denies chest pain, increased sob or doe, wheezing, orthopnea, PND, increased LE swelling, palpitations, dizziness or syncope.   Pt denies polydipsia, polyuria, or new focal neuro s/s.    Pt denies fever, wt loss, night sweats, loss of appetite, or other constitutional symptoms    Wt Readings from Last 3 Encounters:  07/21/22 123 lb (55.8 kg)  06/21/22 119 lb (54 kg)  06/04/22 125 lb 10.6 oz (57 kg)   BP Readings from Last 3 Encounters:  07/21/22 128/74  06/21/22 120/68  06/08/22 (!) 158/83   Immunization History  Administered Date(s) Administered   Fluad Quad(high Dose 65+) 07/21/2022   Influenza Whole 05/18/2007   PFIZER(Purple Top)SARS-COV-2 Vaccination 03/09/2020, 03/30/2020   Pneumococcal Conjugate-13 11/01/2013   Pneumococcal Polysaccharide-23 07/11/2005   Td 06/20/2008   Tdap 11/11/2020  There are no preventive care reminders to display for this patient.    Past Medical History:  Diagnosis Date   GERD 04/10/2007   Qualifier: Diagnosis of  By: Elveria Royals    HYPERLIPIDEMIA 04/13/2007   Qualifier: Diagnosis of  By: Jenny Reichmann MD, Hunt Oris    HYPERTENSION 04/10/2007   Qualifier: Diagnosis of  By: Elveria Royals    Impaired glucose tolerance 09/17/2011   OSTEOPOROSIS 04/10/2007   Qualifier: Diagnosis of  By: Elveria Royals    Unspecified vitamin D deficiency 08/26/2009   Centricity Description: UNSPECIFIED VITAMIN D DEFICIENCY Qualifier: Diagnosis of  By: Jenny Reichmann MD, Hunt Oris   Centricity Description: VITAMIN D DEFICIENCY Qualifier: Diagnosis of  By: Jenny Reichmann MD, Hunt Oris    Past Surgical History:  Procedure Laterality Date   CATARACT EXTRACTION      reports that she has never smoked. She has never used smokeless tobacco. She reports that she does not drink alcohol and does not use drugs. family history includes Heart disease in her father; Stroke in her sister. No Known Allergies Current Outpatient Medications on File Prior to Visit  Medication Sig Dispense Refill   acetaminophen (TYLENOL) 325 MG tablet Take 2 tablets (650 mg total) by mouth every 6 (six) hours as needed for mild pain (or Fever >/= 101).     feeding supplement (ENSURE ENLIVE / ENSURE PLUS) LIQD Take 237 mLs by mouth daily. 237 mL 12   fiber supplement, BANATROL TF, liquid Take 60 mLs by mouth 2 (two) times daily.     furosemide (LASIX) 20 MG tablet Take 1 tablet (20 mg total) by mouth daily. 30 tablet 3   metoprolol tartrate (LOPRESSOR) 25 MG tablet Take 0.5 tablets (12.5 mg total) by mouth 2 (two) times daily. 30 tablet 11   nitrofurantoin (MACRODANTIN) 50 MG capsule Take 1 capsule (50 mg total) by mouth 4 (four) times daily. (Patient not taking: Reported on 07/21/2022) 40 capsule 0   nystatin cream (MYCOSTATIN) Apply 1 Application topically 2 (two) times  daily. 30 g 2   polyethylene glycol (MIRALAX / GLYCOLAX) 17 g packet Take 17 g by mouth 2 (two) times daily as needed for mild constipation. 14 each 0   potassium chloride (KLOR-CON 10) 10 MEQ tablet Take 1 tablet (10 mEq total) by mouth 2 (two) times daily. 60 tablet 11   senna-docusate (SENOKOT-S) 8.6-50 MG tablet Take 1 tablet by mouth 2 (two) times daily as needed for moderate constipation.     potassium chloride (KLOR-CON M) 10 MEQ tablet Take 1 tablet (10 mEq total) by mouth daily. 30 tablet 1   No current facility-administered medications on file prior to visit.        ROS:  All others reviewed and negative.  Objective        PE:  BP 128/74  (BP Location: Left Arm, Patient Position: Sitting, Cuff Size: Large)   Pulse 64   Temp 97.6 F (36.4 C) (Oral)   Ht 5\' 3"  (1.6 m)   Wt 123 lb (55.8 kg)   SpO2 94%   BMI 21.79 kg/m                 Constitutional: Pt appears in NAD               HENT: Head: NCAT.                Right Ear: External ear normal.                 Left Ear: External ear normal.                Eyes: . Pupils are equal, round, and reactive to light. Conjunctivae and EOM are normal               Nose: without d/c or deformity               Neck: Neck supple. Gross normal ROM               Cardiovascular: Normal rate and regular rhythm.                 Pulmonary/Chest: Effort normal and breath sounds without rales or wheezing.                Abd:  Soft, NT, ND, + BS, no organomegaly               Neurological: Pt is alert. At baseline orientation, motor grossly intact               Skin: Skin is warm. No rashes, no other new lesions, LE edema - none               Psychiatric: Pt behavior is normal without agitation   Micro: none  Cardiac tracings I have personally interpreted today:  none  Pertinent Radiological findings (summarize): none   Lab Results  Component Value Date   WBC 9.8 07/21/2022   HGB 12.0 07/21/2022   HCT 35.6 (L) 07/21/2022   PLT 409.0 (H) 07/21/2022   GLUCOSE 93 07/21/2022   CHOL 246 (H) 07/21/2022   TRIG 156.0 (H) 07/21/2022   HDL 50.80 07/21/2022   LDLCALC 164 (H) 07/21/2022   ALT 16 07/21/2022   AST 19 07/21/2022   NA 138 07/21/2022   K 4.4 07/21/2022   CL 103 07/21/2022   CREATININE 0.86 07/21/2022   BUN 22 07/21/2022   CO2 28 07/21/2022   TSH 4.72 07/21/2022   INR  1.0 04/09/2022   HGBA1C 5.2 07/21/2022   MICROALBUR 1.0 07/21/2022   Assessment/Plan:  Gloria [name] is a 89 y.o. White or Caucasian [1] female with  has a past medical history of GERD (04/10/2007), HYPERLIPIDEMIA (04/13/2007), HYPERTENSION (04/10/2007), Impaired glucose tolerance (09/17/2011), OSTEOPOROSIS  (04/10/2007), and Unspecified vitamin D deficiency (08/26/2009).  Encounter for well adult exam with abnormal findings Age and sex appropriate education and counseling updated with regular exercise and diet Referrals for preventative services - none needed Immunizations addressed - for flu shot Smoking counseling  - none needed Evidence for depression or other mood disorder - none significant Most recent labs reviewed. I have personally reviewed and have noted: 1) the patient's medical and social history 2) The patient's current medications and supplements 3) The patient's height, weight, and BMI have been recorded in the chart   Essential hypertension BP Readings from Last 3 Encounters:  07/21/22 128/74  06/21/22 120/68  06/08/22 (!) 158/83   Stable, pt to continue medical treatment lopressor 12.5 bid   Hyperlipidemia Lab Results  Component Value Date   LDLCALC 164 (H) 07/21/2022   Severe uncontrolled, goal ldl < 70, pt to restart crestor 40 mg qd   Impaired glucose tolerance Lab Results  Component Value Date   HGBA1C 5.2 07/21/2022   Stable, pt to continue current medical treatment  - diet, wt control   Vitamin D deficiency Last vitamin D Lab Results  Component Value Date   VD25OH 24.70 (L) 07/21/2022   Low, to start oral replacement   Anemia of chronic disease Pt has not recent overt bleeding, bruising - for cbc with labs  Iron deficiency anemia Also for f/u iron level  Stage 3a chronic kidney disease (CKD) (HCC) Lab Results  Component Value Date   CREATININE 0.86 07/21/2022   Stable overall, cont to avoid nephrotoxins   UTI (urinary tract infection) Recent onset now clinically resolved but little symptoms to begin with - for f/u urine culture  Followup: Return in about 6 months (around 01/19/2023).  Oliver Barre, MD 07/24/2022 6:16 AM [facility]

## 2022-07-24 ENCOUNTER — Encounter: Payer: Self-pay | Admitting: Internal Medicine

## 2022-07-24 NOTE — Assessment & Plan Note (Signed)
Also for f/u iron level 

## 2022-07-24 NOTE — Assessment & Plan Note (Signed)
Pt has not recent overt bleeding, bruising - for cbc with labs

## 2022-07-24 NOTE — Assessment & Plan Note (Signed)
Age and sex appropriate education and counseling updated with regular exercise and diet Referrals for preventative services - none needed Immunizations addressed - for flu shot Smoking counseling  - none needed Evidence for depression or other mood disorder - none significant Most recent labs reviewed. I have personally reviewed and have noted: 1) the patient's medical and social history 2) The patient's current medications and supplements 3) The patient's height, weight, and BMI have been recorded in the chart

## 2022-07-24 NOTE — Assessment & Plan Note (Signed)
Lab Results  Component Value Date   CREATININE 0.86 07/21/2022   Stable overall, cont to avoid nephrotoxins

## 2022-07-24 NOTE — Assessment & Plan Note (Signed)
BP Readings from Last 3 Encounters:  07/21/22 128/74  06/21/22 120/68  06/08/22 (!) 158/83   Stable, pt to continue medical treatment lopressor 12.5 bid

## 2022-07-24 NOTE — Assessment & Plan Note (Signed)
Last vitamin D Lab Results  Component Value Date   VD25OH 24.70 (L) 07/21/2022   Low, to start oral replacement

## 2022-07-24 NOTE — Assessment & Plan Note (Signed)
Lab Results  Component Value Date   HGBA1C 5.2 07/21/2022   Stable, pt to continue current medical treatment  - diet, wt control

## 2022-07-24 NOTE — Assessment & Plan Note (Signed)
Recent onset now clinically resolved but little symptoms to begin with - for f/u urine culture

## 2022-07-24 NOTE — Assessment & Plan Note (Signed)
Lab Results  Component Value Date   LDLCALC 164 (H) 07/21/2022   Severe uncontrolled, goal ldl < 70, pt to restart crestor 40 mg qd

## 2022-07-26 ENCOUNTER — Encounter: Payer: Self-pay | Admitting: *Deleted

## 2022-07-26 LAB — URINE CULTURE

## 2022-07-26 MED ORDER — NITROFURANTOIN MACROCRYSTAL 50 MG PO CAPS: 50.0000 mg | ORAL_CAPSULE | Freq: Four times a day (QID) | ORAL | 0 refills | Status: AC

## 2022-07-26 NOTE — Addendum Note (Signed)
Addended by: Biagio Borg on: 07/26/2022 12:02 PM   Modules accepted: Orders

## 2022-07-26 NOTE — Telephone Encounter (Signed)
This encounter was created in error - please disregard.

## 2022-08-12 DIAGNOSIS — R52 Pain, unspecified: Secondary | ICD-10-CM | POA: Diagnosis not present

## 2022-08-12 DIAGNOSIS — G4489 Other headache syndrome: Secondary | ICD-10-CM | POA: Diagnosis not present

## 2022-08-12 DIAGNOSIS — I10 Essential (primary) hypertension: Secondary | ICD-10-CM | POA: Diagnosis not present

## 2022-08-16 ENCOUNTER — Telehealth: Payer: Self-pay

## 2022-08-16 NOTE — Telephone Encounter (Signed)
While on the phone with patients nephew Lanny Hurst, call was disconnected. Tried calling back and no answer. If he calls back in regards to her AWV appointment please transfer to [phone].

## 2022-08-17 ENCOUNTER — Other Ambulatory Visit: Payer: Self-pay | Admitting: Pharmacy Technician

## 2022-08-30 DIAGNOSIS — B351 Tinea unguium: Secondary | ICD-10-CM | POA: Diagnosis not present

## 2022-08-30 DIAGNOSIS — M79676 Pain in unspecified toe(s): Secondary | ICD-10-CM | POA: Diagnosis not present

## 2022-09-26 ENCOUNTER — Telehealth: Payer: Self-pay | Admitting: Internal Medicine

## 2022-09-26 NOTE — Telephone Encounter (Signed)
We have received FL2 PW in the fax, to be filled out by provider. Patient requested to send it via Fax within 7-days. Document is located in providers tray at front office.Please advise at Mobile [phone] (mobile)   Please fax to: [phone]

## 2022-09-27 NOTE — Telephone Encounter (Signed)
Forms received,completion in progress 

## 2022-09-29 ENCOUNTER — Ambulatory Visit: Payer: PPO | Admitting: *Deleted

## 2022-09-30 ENCOUNTER — Ambulatory Visit (INDEPENDENT_AMBULATORY_CARE_PROVIDER_SITE_OTHER): Payer: PPO

## 2022-09-30 DIAGNOSIS — Z111 Encounter for screening for respiratory tuberculosis: Secondary | ICD-10-CM

## 2022-09-30 NOTE — Progress Notes (Signed)
Pt was given TB skin test at 9:46 am.  Pt PPD read was negative at 0 mm on her left forearm.

## 2022-10-03 LAB — TB SKIN TEST
Induration: 0 mm
TB Skin Test: NEGATIVE

## 2022-10-11 ENCOUNTER — Telehealth: Payer: Self-pay | Admitting: Internal Medicine

## 2022-10-11 NOTE — Telephone Encounter (Signed)
Gloria [name] from [facility] called and asked for clarification on patient's medications. She would like a call back at [phone].

## 2022-10-12 NOTE — Telephone Encounter (Signed)
Spoke with [place] who states that patient was admitted yesterday and has reached out to our office regarding medication. Documentation is needed and faxed to discontinue Senna-docusate and macrodantin but to keep miralax and tylenol as PRN if you agree. Once document is written, I will fax to [phone] per request.

## 2022-10-13 NOTE — Telephone Encounter (Signed)
Ok rx done hardcopy to [facility] 

## 2022-10-14 NOTE — Telephone Encounter (Signed)
Document faxed to Juanita at [facility]

## 2022-11-21 ENCOUNTER — Ambulatory Visit (INDEPENDENT_AMBULATORY_CARE_PROVIDER_SITE_OTHER): Payer: PPO

## 2022-11-21 VITALS — Ht 63.0 in | Wt 123.0 lb

## 2022-11-21 DIAGNOSIS — Z0000 Encounter for general adult medical examination without abnormal findings: Secondary | ICD-10-CM | POA: Diagnosis not present

## 2022-11-21 NOTE — Progress Notes (Signed)
I connected with  Gloria [name] on 11/21/22 by a audio enabled telemedicine application and verified that I am speaking with the correct person using two identifiers.  Patient Location: Other:  Materials engineer Location: Office/Clinic  I discussed the limitations of evaluation and management by telemedicine. The patient expressed understanding and agreed to proceed.  Subjective:   Gloria [name] is a 89 y.o. female who presents for Medicare Annual (Subsequent) preventive examination.  Review of Systems     Cardiac Risk Factors include: advanced age (>73men, >37 women);dyslipidemia;family history of premature cardiovascular disease;hypertension;sedentary lifestyle     Objective:    Today's Vitals   11/21/22 0917  Weight: 123 lb (55.8 kg)  Height: 5\' 3"  (1.6 m)  PainSc: 0-No pain   Body mass index is 21.79 kg/m.     11/21/2022    9:18 AM 06/04/2022   11:29 PM 04/13/2022    7:00 PM 04/09/2022   11:30 AM 11/16/2021    9:48 AM 05/06/2020   12:46 PM  Advanced Directives  Does Patient Have a Medical Advance Directive? Yes Yes No No Yes Yes  Type of Estate agent of [place];Living will Out of facility DNR (pink MOST or yellow form)   Living will;Healthcare Power of [employer] of [place];Living will  Does patient want to make changes to medical advance directive?  Yes (ED - Information included in AVS)   No - Patient declined No - Patient declined  Copy of Healthcare Power of Attorney in Chart? No - copy requested    No - copy requested No - copy requested    Current Medications (verified) Outpatient Encounter Medications as of 11/21/2022  Medication Sig   rosuvastatin (CRESTOR) 40 MG tablet Take 1 tablet (40 mg total) by mouth daily.   acetaminophen (TYLENOL) 325 MG tablet Take 2 tablets (650 mg total) by mouth every 6 (six) hours as needed for mild pain (or Fever >/= 101).   feeding supplement (ENSURE ENLIVE / ENSURE  PLUS) LIQD Take 237 mLs by mouth daily.   fiber supplement, BANATROL TF, liquid Take 60 mLs by mouth 2 (two) times daily.   furosemide (LASIX) 20 MG tablet Take 1 tablet (20 mg total) by mouth daily.   metoprolol tartrate (LOPRESSOR) 25 MG tablet Take 0.5 tablets (12.5 mg total) by mouth 2 (two) times daily.   nitrofurantoin (MACRODANTIN) 50 MG capsule Take 1 capsule (50 mg total) by mouth 4 (four) times daily.   nystatin cream (MYCOSTATIN) Apply 1 Application topically 2 (two) times daily.   polyethylene glycol (MIRALAX / GLYCOLAX) 17 g packet Take 17 g by mouth 2 (two) times daily as needed for mild constipation.   potassium chloride (KLOR-CON 10) 10 MEQ tablet Take 1 tablet (10 mEq total) by mouth 2 (two) times daily.   potassium chloride (KLOR-CON M) 10 MEQ tablet Take 1 tablet (10 mEq total) by mouth daily.   senna-docusate (SENOKOT-S) 8.6-50 MG tablet Take 1 tablet by mouth 2 (two) times daily as needed for moderate constipation.   No facility-administered encounter medications on file as of 11/21/2022.    Allergies (verified) Patient has no known allergies.   History: Past Medical History:  Diagnosis Date   GERD 04/10/2007   Qualifier: Diagnosis of  By: Maris Berger    HYPERLIPIDEMIA 04/13/2007   Qualifier: Diagnosis of  By: Jonny Ruiz MD, Len Blalock    HYPERTENSION 04/10/2007   Qualifier: Diagnosis of  By: Maris Berger  Impaired glucose tolerance 09/17/2011   OSTEOPOROSIS 04/10/2007   Qualifier: Diagnosis of  By: Maris Berger    Unspecified vitamin D deficiency 08/26/2009   Centricity Description: UNSPECIFIED VITAMIN D DEFICIENCY Qualifier: Diagnosis of  By: Jonny Ruiz MD, Len Blalock  Centricity Description: VITAMIN D DEFICIENCY Qualifier: Diagnosis of  By: Jonny Ruiz MD, Len Blalock    Past Surgical History:  Procedure Laterality Date   CATARACT EXTRACTION     Family History  Problem Relation Age of Onset   Heart disease Father    Stroke Sister    Social History    Socioeconomic History   Marital status: Single    Spouse name: Not on file   Number of children: Not on file   Years of education: Not on file   Highest education level: Not on file  Occupational History   Not on file  Tobacco Use   Smoking status: Never   Smokeless tobacco: Never  Substance and Sexual Activity   Alcohol use: No   Drug use: No   Sexual activity: Not on file  Other Topics Concern   Not on file  Social History Narrative   Not on file   Social Determinants of Health   Financial Resource Strain: Low Risk  (11/21/2022)   Overall Financial Resource Strain (CARDIA)    Difficulty of Paying Living Expenses: Not hard at all  Food Insecurity: No Food Insecurity (11/21/2022)   Hunger Vital Sign    Worried About Running Out of Food in the Last Year: Never true    Ran Out of Food in the Last Year: Never true  Transportation Needs: No Transportation Needs (11/21/2022)   PRAPARE - Administrator, Civil Service (Medical): No    Lack of Transportation (Non-Medical): No  Physical Activity: Inactive (11/21/2022)   Exercise Vital Sign    Days of Exercise per Week: 0 days    Minutes of Exercise per Session: 0 min  Stress: No Stress Concern Present (11/21/2022)   [employer] of Occupational Health - Occupational Stress Questionnaire    Feeling of Stress : Not at all  Social Connections: Moderately Integrated (11/21/2022)   Social Connection and Isolation Panel [NHANES]    Frequency of Communication with Friends and Family: More than three times a week    Frequency of Social Gatherings with Friends and Family: More than three times a week    Attends Religious Services: More than 4 times per year    Active Member of [employer] or Organizations: Yes    Attends Banker Meetings: More than 4 times per year    Marital Status: Widowed    Tobacco Counseling Counseling given: Not Answered   Clinical Intake:  Pre-visit preparation completed: Yes  Pain  : No/denies pain Pain Score: 0-No pain     BMI - recorded: 21.79 Nutritional Status: BMI of 19-24  Normal Nutritional Risks: None Diabetes: No  How often do you need to have someone help you when you read instructions, pamphlets, or other written materials from your doctor or pharmacy?: 1 - Never What is the last grade level you completed in school?: HSG  Diabetic? No  Interpreter Needed?: No  Information entered by :: Susie Cassette, LPN.   Activities of Daily Living    11/21/2022    9:20 AM 06/05/2022    6:21 AM  In your present state of health, do you have any difficulty performing the following activities:  Hearing? 1 1  Comment No hearing aids.  Vision? 0 0  Difficulty concentrating or making decisions? 0 0  Walking or climbing stairs? 0 1  Dressing or bathing? 0 0  Doing errands, shopping? 1 0  Preparing Food and eating ? Y   Comment no cooking   Using the Toilet? N   In the past six months, have you accidently leaked urine? N   Do you have problems with loss of bowel control? N   Managing your Medications? N   Managing your Finances? N   Housekeeping or managing your Housekeeping? N     Patient Care Team: [name], MD as PCP - General [place], Lisette Abu, MD as PCP - Cardiology (Cardiology) [facility] as Consulting Physician (Optometry)  Indicate any recent Medical Services you may have received from other than [facility] providers in the past year (date may be approximate).     Assessment:   This is a routine wellness examination for Gloria [name].  Hearing/Vision screen Hearing Screening - Comments:: Denies hearing difficulties   Vision Screening - Comments:: Wears readers for small print  - up to date with routine eye exams with [facility]   Dietary issues and exercise activities discussed: Current Exercise Habits: The patient does not participate in regular exercise at present, Exercise limited by: None  identified   Goals Addressed             This Visit's Progress    My goal is to stay healthy and alive.        Depression Screen    11/21/2022    9:20 AM 07/21/2022   10:30 AM 05/11/2022    8:46 AM 11/16/2021    9:57 AM 11/16/2021    8:21 AM 11/16/2021    8:04 AM 11/11/2020    8:03 AM  PHQ 2/9 Scores  PHQ - 2 Score 0 0 0 0 0 0 0  PHQ- 9 Score 0   0  0     Fall Risk    11/21/2022    9:20 AM 07/21/2022   10:30 AM 06/21/2022   10:04 AM 05/11/2022    8:46 AM 11/16/2021    9:49 AM  Fall Risk   Falls in the past year? 0 0 1 1 0  Number falls in past yr: 0 0 0 1 0  Injury with Fall? 0 0 0 1 0  Risk for fall due to : No Fall Risks  Impaired balance/gait Impaired mobility;Impaired balance/gait No Fall Risks  Follow up Falls prevention discussed  Falls evaluation completed Falls evaluation completed Falls evaluation completed    FALL RISK PREVENTION PERTAINING TO THE HOME:  Any stairs in or around the home? No  If so, are there any without handrails? No  Home free of loose throw rugs in walkways, pet beds, electrical cords, etc? Yes  Adequate lighting in your home to reduce risk of falls? Yes   ASSISTIVE DEVICES UTILIZED TO PREVENT FALLS:  Life alert? Yes  Use of a cane, walker or w/c? No  Grab bars in the bathroom? Yes  Shower chair or bench in shower? Yes  Elevated toilet seat or a handicapped toilet? Yes   TIMED UP AND GO:  Was the test performed? No . Telephonic Visit  Cognitive Function:        11/21/2022    9:26 AM 11/16/2021    9:58 AM  6CIT Screen  What Year? 0 points 0 points  What month? 0 points 0 points  What time? 0 points  0 points  Count back from 20 0 points 0 points  Months in reverse 0 points 0 points  Repeat phrase 0 points 0 points  Total Score 0 points 0 points    Immunizations Immunization History  Administered Date(s) Administered   Fluad Quad(high Dose 65+) 07/21/2022   Influenza Whole 05/18/2007   PFIZER(Purple Top)SARS-COV-2 Vaccination  03/09/2020, 03/30/2020   PPD Test 09/30/2022   Pneumococcal Conjugate-13 11/01/2013   Pneumococcal Polysaccharide-23 07/11/2005   Td 06/20/2008   Tdap 11/11/2020    TDAP status: Up to date  Flu Vaccine status: Up to date  Pneumococcal vaccine status: Up to date  Covid-19 vaccine status: Completed vaccines  Qualifies for Shingles Vaccine? Yes   Zostavax completed No   Shingrix Completed?: No.    Education has been provided regarding the importance of this vaccine. Patient has been advised to call insurance company to determine out of pocket expense if they have not yet received this vaccine. Advised may also receive vaccine at local pharmacy or Health Dept. Verbalized acceptance and understanding.  Screening Tests Health Maintenance  Topic Date Due   INFLUENZA VACCINE  02/09/2023   Medicare Annual Wellness (AWV)  11/21/2023   DTaP/Tdap/Td (3 - Td or Tdap) 11/12/2030   Pneumonia Vaccine 36+ Years old  Completed   HPV VACCINES  Aged Out   DEXA SCAN  Discontinued   COVID-19 Vaccine  Discontinued   Zoster Vaccines- Shingrix  Discontinued    Health Maintenance  There are no preventive care reminders to display for this patient.   Colorectal cancer screening: No longer required.   Mammogram status: No longer required due to age.  Bone Density status:No longer required.  Lung Cancer Screening: (Low Dose CT Chest recommended if Age 39-80 years, 30 pack-year currently smoking OR have quit w/in 15years.) does not qualify.   Lung Cancer Screening Referral:  No  Additional Screening:  Hepatitis C Screening: does not qualify; Completed: No  Vision Screening: Recommended annual ophthalmology exams for early detection of glaucoma and other disorders of the eye. Is the patient up to date with their annual eye exam?  No  Who is the provider or what is the name of the office in which the patient attends annual eye exams? [facility] If pt is not established with a  provider, would they like to be referred to a provider to establish care? No .   Dental Screening: Recommended annual dental exams for proper oral hygiene  Community Resource Referral / Chronic Care Management: CRR required this visit?  No   CCM required this visit?  No      Plan:     I have personally reviewed and noted the following in the patient's chart:   Medical and social history Use of alcohol, tobacco or illicit drugs  Current medications and supplements including opioid prescriptions. Patient is not currently taking opioid prescriptions. Functional ability and status Nutritional status Physical activity Advanced directives List of other physicians Hospitalizations, surgeries, and ER visits in previous 12 months Vitals Screenings to include cognitive, depression, and falls Referrals and appointments  In addition, I have reviewed and discussed with patient certain preventive protocols, quality metrics, and best practice recommendations. A written personalized care plan for preventive services as well as general preventive health recommendations were provided to patient.     Mickeal Needy, LPN   03/08/5620   Nurse Notes:  Normal cognitive status assessed by direct observation via phone conversation by this Nurse Health Advisor. No abnormalities  found.

## 2022-11-21 NOTE — Patient Instructions (Addendum)
Gloria [name] , Thank you for taking time to come for your Medicare Wellness Visit. I appreciate your ongoing commitment to your health goals. Please review the following plan we discussed and let me know if I can assist you in the future.   These are the goals we discussed:  Goals      My goal is to stay healthy and alive.        This is a list of the screening recommended for you and due dates:  Health Maintenance  Topic Date Due   Flu Shot  02/09/2023   Medicare Annual Wellness Visit  11/21/2023   DTaP/Tdap/Td vaccine (3 - Td or Tdap) 11/12/2030   Pneumonia Vaccine  Completed   HPV Vaccine  Aged Out   DEXA scan (bone density measurement)  Discontinued   COVID-19 Vaccine  Discontinued   Zoster (Shingles) Vaccine  Discontinued    Advanced directives: Yes   Conditions/risks identified: Yes  Next appointment: Follow up in one year for your annual wellness visit.   Preventive Care 87 Years and Older, Female Preventive care refers to lifestyle choices and visits with your health care provider that can promote health and wellness. What does preventive care include? A yearly physical exam. This is also called an annual well check. Dental exams once or twice a year. Routine eye exams. Ask your health care provider how often you should have your eyes checked. Personal lifestyle choices, including: Daily care of your teeth and gums. Regular physical activity. Eating a healthy diet. Avoiding tobacco and drug use. Limiting alcohol use. Practicing safe sex. Taking low-dose aspirin every day. Taking vitamin and mineral supplements as recommended by your health care provider. What happens during an annual well check? The services and screenings done by your health care provider during your annual well check will depend on your age, overall health, lifestyle risk factors, and family history of disease. Counseling  Your health care provider may ask you questions about your: Alcohol  use. Tobacco use. Drug use. Emotional well-being. Home and relationship well-being. Sexual activity. Eating habits. History of falls. Memory and ability to understand (cognition). Work and work Astronomer. Reproductive health. Screening  You may have the following tests or measurements: Height, weight, and BMI. Blood pressure. Lipid and cholesterol levels. These may be checked every 5 years, or more frequently if you are over 87 years old. Skin check. Lung cancer screening. You may have this screening every year starting at age 87 if you have a 30-pack-year history of smoking and currently smoke or have quit within the past 15 years. Fecal occult blood test (FOBT) of the stool. You may have this test every year starting at age 87. Flexible sigmoidoscopy or colonoscopy. You may have a sigmoidoscopy every 5 years or a colonoscopy every 10 years starting at age 87. Hepatitis C blood test. Hepatitis B blood test. Sexually transmitted disease (STD) testing. Diabetes screening. This is done by checking your blood sugar (glucose) after you have not eaten for a while (fasting). You may have this done every 1-3 years. Bone density scan. This is done to screen for osteoporosis. You may have this done starting at age 87. Mammogram. This may be done every 1-2 years. Talk to your health care provider about how often you should have regular mammograms. Talk with your health care provider about your test results, treatment options, and if necessary, the need for more tests. Vaccines  Your health care provider may recommend certain vaccines, such as: Influenza vaccine.  This is recommended every year. Tetanus, diphtheria, and acellular pertussis (Tdap, Td) vaccine. You may need a Td booster every 10 years. Zoster vaccine. You may need this after age 49. Pneumococcal 13-valent conjugate (PCV13) vaccine. One dose is recommended after age 87. Pneumococcal polysaccharide (PPSV23) vaccine. One dose is  recommended after age 87. Talk to your health care provider about which screenings and vaccines you need and how often you need them. This information is not intended to replace advice given to you by your health care provider. Make sure you discuss any questions you have with your health care provider. Document Released: 07/24/2015 Document Revised: 03/16/2016 Document Reviewed: 04/28/2015 Elsevier Interactive Patient Education  2017 [place] Prevention in the Home Falls can cause injuries. They can happen to people of all ages. There are many things you can do to make your home safe and to help prevent falls. What can I do on the outside of my home? Regularly fix the edges of walkways and driveways and fix any cracks. Remove anything that might make you trip as you walk through a door, such as a raised step or threshold. Trim any bushes or trees on the path to your home. Use bright outdoor lighting. Clear any walking paths of anything that might make someone trip, such as rocks or tools. Regularly check to see if handrails are loose or broken. Make sure that both sides of any steps have handrails. Any raised decks and porches should have guardrails on the edges. Have any leaves, snow, or ice cleared regularly. Use sand or salt on walking paths during winter. Clean up any spills in your garage right away. This includes oil or grease spills. What can I do in the bathroom? Use night lights. Install grab bars by the toilet and in the tub and shower. Do not use towel bars as grab bars. Use non-skid mats or decals in the tub or shower. If you need to sit down in the shower, use a plastic, non-slip stool. Keep the floor dry. Clean up any water that spills on the floor as soon as it happens. Remove soap buildup in the tub or shower regularly. Attach bath mats securely with double-sided non-slip rug tape. Do not have throw rugs and other things on the floor that can make you  trip. What can I do in the bedroom? Use night lights. Make sure that you have a light by your bed that is easy to reach. Do not use any sheets or blankets that are too big for your bed. They should not hang down onto the floor. Have a firm chair that has side arms. You can use this for support while you get dressed. Do not have throw rugs and other things on the floor that can make you trip. What can I do in the kitchen? Clean up any spills right away. Avoid walking on wet floors. Keep items that you use a lot in easy-to-reach places. If you need to reach something above you, use a strong step stool that has a grab bar. Keep electrical cords out of the way. Do not use floor polish or wax that makes floors slippery. If you must use wax, use non-skid floor wax. Do not have throw rugs and other things on the floor that can make you trip. What can I do with my stairs? Do not leave any items on the stairs. Make sure that there are handrails on both sides of the stairs and use them. Fix handrails  that are broken or loose. Make sure that handrails are as long as the stairways. Check any carpeting to make sure that it is firmly attached to the stairs. Fix any carpet that is loose or worn. Avoid having throw rugs at the top or bottom of the stairs. If you do have throw rugs, attach them to the floor with carpet tape. Make sure that you have a light switch at the top of the stairs and the bottom of the stairs. If you do not have them, ask someone to add them for you. What else can I do to help prevent falls? Wear shoes that: Do not have high heels. Have rubber bottoms. Are comfortable and fit you well. Are closed at the toe. Do not wear sandals. If you use a stepladder: Make sure that it is fully opened. Do not climb a closed stepladder. Make sure that both sides of the stepladder are locked into place. Ask someone to hold it for you, if possible. Clearly mark and make sure that you can  see: Any grab bars or handrails. First and last steps. Where the edge of each step is. Use tools that help you move around (mobility aids) if they are needed. These include: Canes. Walkers. Scooters. Crutches. Turn on the lights when you go into a dark area. Replace any light bulbs as soon as they burn out. Set up your furniture so you have a clear path. Avoid moving your furniture around. If any of your floors are uneven, fix them. If there are any pets around you, be aware of where they are. Review your medicines with your doctor. Some medicines can make you feel dizzy. This can increase your chance of falling. Ask your doctor what other things that you can do to help prevent falls. This information is not intended to replace advice given to you by your health care provider. Make sure you discuss any questions you have with your health care provider. Document Released: 04/23/2009 Document Revised: 12/03/2015 Document Reviewed: 08/01/2014 Elsevier Interactive Patient Education  2017 [employer].

## 2022-11-22 ENCOUNTER — Encounter: Payer: PPO | Admitting: Internal Medicine

## 2022-11-22 ENCOUNTER — Encounter: Payer: Self-pay | Admitting: Internal Medicine

## 2022-11-22 ENCOUNTER — Ambulatory Visit: Payer: PPO

## 2022-11-22 ENCOUNTER — Ambulatory Visit (INDEPENDENT_AMBULATORY_CARE_PROVIDER_SITE_OTHER): Payer: PPO | Admitting: Internal Medicine

## 2022-11-22 VITALS — BP 122/76 | HR 78 | Temp 98.3°F | Ht 63.0 in | Wt 125.0 lb

## 2022-11-22 DIAGNOSIS — I350 Nonrheumatic aortic (valve) stenosis: Secondary | ICD-10-CM

## 2022-11-22 DIAGNOSIS — I10 Essential (primary) hypertension: Secondary | ICD-10-CM | POA: Diagnosis not present

## 2022-11-22 DIAGNOSIS — E7800 Pure hypercholesterolemia, unspecified: Secondary | ICD-10-CM | POA: Diagnosis not present

## 2022-11-22 DIAGNOSIS — D509 Iron deficiency anemia, unspecified: Secondary | ICD-10-CM | POA: Diagnosis not present

## 2022-11-22 DIAGNOSIS — N390 Urinary tract infection, site not specified: Secondary | ICD-10-CM

## 2022-11-22 DIAGNOSIS — E538 Deficiency of other specified B group vitamins: Secondary | ICD-10-CM

## 2022-11-22 DIAGNOSIS — E559 Vitamin D deficiency, unspecified: Secondary | ICD-10-CM

## 2022-11-22 DIAGNOSIS — R7302 Impaired glucose tolerance (oral): Secondary | ICD-10-CM | POA: Diagnosis not present

## 2022-11-22 LAB — IBC PANEL
Iron: 16 ug/dL — ABNORMAL LOW (ref 42–145)
Saturation Ratios: 6.5 % — ABNORMAL LOW (ref 20.0–50.0)
TIBC: 246.4 ug/dL — ABNORMAL LOW (ref 250.0–450.0)
Transferrin: 176 mg/dL — ABNORMAL LOW (ref 212.0–360.0)

## 2022-11-22 LAB — HEPATIC FUNCTION PANEL
ALT: 30 U/L (ref 0–35)
AST: 22 U/L (ref 0–37)
Albumin: 3.8 g/dL (ref 3.5–5.2)
Alkaline Phosphatase: 74 U/L (ref 39–117)
Bilirubin, Direct: 0.2 mg/dL (ref 0.0–0.3)
Total Bilirubin: 1.2 mg/dL (ref 0.2–1.2)
Total Protein: 7.5 g/dL (ref 6.0–8.3)

## 2022-11-22 LAB — CBC WITH DIFFERENTIAL/PLATELET
Basophils Absolute: 0 10*3/uL (ref 0.0–0.1)
Basophils Relative: 0.3 % (ref 0.0–3.0)
Eosinophils Absolute: 0 10*3/uL (ref 0.0–0.7)
Eosinophils Relative: 0.1 % (ref 0.0–5.0)
HCT: 32.7 % — ABNORMAL LOW (ref 36.0–46.0)
Hemoglobin: 11.3 g/dL — ABNORMAL LOW (ref 12.0–15.0)
Lymphocytes Relative: 13.6 % (ref 12.0–46.0)
Lymphs Abs: 1.5 10*3/uL (ref 0.7–4.0)
MCHC: 34.4 g/dL (ref 30.0–36.0)
MCV: 85.3 fl (ref 78.0–100.0)
Monocytes Absolute: 1 10*3/uL (ref 0.1–1.0)
Monocytes Relative: 9.7 % (ref 3.0–12.0)
Neutro Abs: 8.2 10*3/uL — ABNORMAL HIGH (ref 1.4–7.7)
Neutrophils Relative %: 76.3 % (ref 43.0–77.0)
Platelets: 366 10*3/uL (ref 150.0–400.0)
RBC: 3.83 Mil/uL — ABNORMAL LOW (ref 3.87–5.11)
RDW: 13.5 % (ref 11.5–15.5)
WBC: 10.8 10*3/uL — ABNORMAL HIGH (ref 4.0–10.5)

## 2022-11-22 LAB — LIPID PANEL
Cholesterol: 134 mg/dL (ref 0–200)
HDL: 50 mg/dL (ref >39.00)
LDL Cholesterol: 65 mg/dL (ref 0–99)
NonHDL: 83.77
Total CHOL/HDL Ratio: 3
Triglycerides: 93 mg/dL (ref 0.0–149.0)
VLDL: 18.6 mg/dL (ref 0.0–40.0)

## 2022-11-22 LAB — URINALYSIS, ROUTINE W REFLEX MICROSCOPIC
Bilirubin Urine: NEGATIVE
Ketones, ur: NEGATIVE
Nitrite: POSITIVE — AB
RBC / HPF: NONE SEEN (ref 0–?)
Specific Gravity, Urine: 1.01 (ref 1.000–1.030)
Total Protein, Urine: NEGATIVE
Urine Glucose: NEGATIVE
Urobilinogen, UA: 0.2 (ref 0.0–1.0)
pH: 7 (ref 5.0–8.0)

## 2022-11-22 LAB — FERRITIN: Ferritin: 149.6 ng/mL (ref 10.0–291.0)

## 2022-11-22 LAB — BASIC METABOLIC PANEL
BUN: 16 mg/dL (ref 6–23)
CO2: 29 mEq/L (ref 19–32)
Calcium: 9.8 mg/dL (ref 8.4–10.5)
Chloride: 98 mEq/L (ref 96–112)
Creatinine, Ser: 0.8 mg/dL (ref 0.40–1.20)
GFR: 63.27 mL/min (ref >60.00)
Glucose, Bld: 105 mg/dL — ABNORMAL HIGH (ref 70–99)
Potassium: 4.2 mEq/L (ref 3.5–5.1)
Sodium: 136 mEq/L (ref 135–145)

## 2022-11-22 LAB — HEMOGLOBIN A1C: Hgb A1c MFr Bld: 6.2 % (ref 4.6–6.5)

## 2022-11-22 LAB — MICROALBUMIN / CREATININE URINE RATIO
Creatinine,U: 51.7 mg/dL
Microalb Creat Ratio: 1.4 mg/g (ref 0.0–30.0)
Microalb, Ur: <0.7 mg/dL (ref 0.0–1.9)

## 2022-11-22 LAB — VITAMIN D 25 HYDROXY (VIT D DEFICIENCY, FRACTURES): VITD: 23.63 ng/mL — ABNORMAL LOW (ref 30.00–100.00)

## 2022-11-22 LAB — TSH: TSH: 2 u[IU]/mL (ref 0.35–5.50)

## 2022-11-22 LAB — VITAMIN B12: Vitamin B-12: 284 pg/mL (ref 211–911)

## 2022-11-22 NOTE — Progress Notes (Signed)
The test results show that your current treatment is OK, as the tests are stable.  Please continue the same plan.  There is no other need for change of treatment or further evaluation based on these results, at this time.  thanks 

## 2022-11-22 NOTE — Assessment & Plan Note (Signed)
Stable clinically, cont to follow

## 2022-11-22 NOTE — Assessment & Plan Note (Signed)
Lab Results  Component Value Date   LDLCALC 164 (H) 07/21/2022   Unocntrolled, now with good crestor 40 mg compliance, for f/u lab, goal ldl < 70

## 2022-11-22 NOTE — Assessment & Plan Note (Signed)
Lab Results  Component Value Date   HGBA1C 5.2 07/21/2022   Stable, pt to continue current medical treatment  - diet, wt control  

## 2022-11-22 NOTE — Assessment & Plan Note (Signed)
Last vitamin D Lab Results  Component Value Date   VD25OH 24.70 (L) 07/21/2022   Low, to start oral replacement  

## 2022-11-22 NOTE — Assessment & Plan Note (Signed)
BP Readings from Last 3 Encounters:  11/22/22 122/76  07/21/22 128/74  06/21/22 120/68   Stable, pt to continue medical treatment lopressor 12.5 bid

## 2022-11-22 NOTE — Progress Notes (Signed)
Patient ID: Gloria [name], female   DOB: 1926/07/17, 89 y.o.   MRN: [id number]        Chief Complaint: follow up HTN, HLD and hyperglycemia, mod AS, low vit d, iron deficiency       HPI:  Gloria [name] is a 89 y.o. female here overall doing well now living at assisted living with excellent medical and family support; walking without cane today but has a back brace as this tends to help pain later in the day.  Pt denies chest pain, increased sob or doe, wheezing, orthopnea, PND, increased LE swelling, palpitations, dizziness or syncope.   Pt denies polydipsia, polyuria, or new focal neuro s/s.    Pt denies fever, wt loss, night sweats, loss of appetite, or other constitutional symptoms  No overt bleeding or bruising. No other new complaints.  Had UTI in jan - Denies urinary symptoms such as dysuria, frequency, urgency, flank pain, hematuria or n/v, fever, chills.         Wt Readings from Last 3 Encounters:  11/22/22 125 lb (56.7 kg)  11/21/22 123 lb (55.8 kg)  07/21/22 123 lb (55.8 kg)   BP Readings from Last 3 Encounters:  11/22/22 122/76  07/21/22 128/74  06/21/22 120/68         Past Medical History:  Diagnosis Date   GERD 04/10/2007   Qualifier: Diagnosis of  By: Maris Berger    HYPERLIPIDEMIA 04/13/2007   Qualifier: Diagnosis of  By: Jonny Ruiz MD, Len Blalock    HYPERTENSION 04/10/2007   Qualifier: Diagnosis of  By: Maris Berger    Impaired glucose tolerance 09/17/2011   OSTEOPOROSIS 04/10/2007   Qualifier: Diagnosis of  By: Maris Berger    Unspecified vitamin D deficiency 08/26/2009   Centricity Description: UNSPECIFIED VITAMIN D DEFICIENCY Qualifier: Diagnosis of  By: Jonny Ruiz MD, Len Blalock  Centricity Description: VITAMIN D DEFICIENCY Qualifier: Diagnosis of  By: Jonny Ruiz MD, Len Blalock    Past Surgical History:  Procedure Laterality Date   CATARACT EXTRACTION      reports that she has never smoked. She has never used smokeless tobacco. She reports that she does  not drink alcohol and does not use drugs. family history includes Heart disease in her father; Stroke in her sister. No Known Allergies Current Outpatient Medications on File Prior to Visit  Medication Sig Dispense Refill   acetaminophen (TYLENOL) 325 MG tablet Take 2 tablets (650 mg total) by mouth every 6 (six) hours as needed for mild pain (or Fever >/= 101).     feeding supplement (ENSURE ENLIVE / ENSURE PLUS) LIQD Take 237 mLs by mouth daily. 237 mL 12   fiber supplement, BANATROL TF, liquid Take 60 mLs by mouth 2 (two) times daily.     furosemide (LASIX) 20 MG tablet Take 1 tablet (20 mg total) by mouth daily. 30 tablet 3   metoprolol tartrate (LOPRESSOR) 25 MG tablet Take 0.5 tablets (12.5 mg total) by mouth 2 (two) times daily. 30 tablet 11   nitrofurantoin (MACRODANTIN) 50 MG capsule Take 1 capsule (50 mg total) by mouth 4 (four) times daily. 40 capsule 0   nystatin cream (MYCOSTATIN) Apply 1 Application topically 2 (two) times daily. 30 g 2   polyethylene glycol (MIRALAX / GLYCOLAX) 17 g packet Take 17 g by mouth 2 (two) times daily as needed for mild constipation. 14 each 0   potassium chloride (KLOR-CON 10) 10 MEQ tablet Take 1 tablet (10 mEq total) by mouth 2 (  two) times daily. 60 tablet 11   rosuvastatin (CRESTOR) 40 MG tablet Take 1 tablet (40 mg total) by mouth daily. 90 tablet 3   senna-docusate (SENOKOT-S) 8.6-50 MG tablet Take 1 tablet by mouth 2 (two) times daily as needed for moderate constipation.     potassium chloride (KLOR-CON M) 10 MEQ tablet Take 1 tablet (10 mEq total) by mouth daily. 30 tablet 1   No current facility-administered medications on file prior to visit.        ROS:  All others reviewed and negative.  Objective        PE:  BP 122/76 (BP Location: Right Arm, Patient Position: Sitting, Cuff Size: Normal)   Pulse 78   Temp 98.3 F (36.8 C) (Oral)   Ht 5\' 3"  (1.6 m)   Wt 125 lb (56.7 kg)   SpO2 98%   BMI 22.14 kg/m                 Constitutional:  Pt appears in NAD               HENT: Head: NCAT.                Right Ear: External ear normal.                 Left Ear: External ear normal.                Eyes: . Pupils are equal, round, and reactive to light. Conjunctivae and EOM are normal               Nose: without d/c or deformity               Neck: Neck supple. Gross normal ROM               Cardiovascular: Normal rate and regular rhythm.  With 2-3/6 syst murmur RUSB               Pulmonary/Chest: Effort normal and breath sounds without rales or wheezing.                Abd:  Soft, NT, ND, + BS, no organomegaly               Neurological: Pt is alert. At baseline orientation, motor grossly intact               Skin: Skin is warm. No rashes, no other new lesions, LE edema - none               Psychiatric: Pt behavior is normal without agitation   Micro: none  Cardiac tracings I have personally interpreted today:  none  Pertinent Radiological findings (summarize): none   Lab Results  Component Value Date   WBC 9.8 07/21/2022   HGB 12.0 07/21/2022   HCT 35.6 (L) 07/21/2022   PLT 409.0 (H) 07/21/2022   GLUCOSE 93 07/21/2022   CHOL 246 (H) 07/21/2022   TRIG 156.0 (H) 07/21/2022   HDL 50.80 07/21/2022   LDLCALC 164 (H) 07/21/2022   ALT 16 07/21/2022   AST 19 07/21/2022   NA 138 07/21/2022   K 4.4 07/21/2022   CL 103 07/21/2022   CREATININE 0.86 07/21/2022   BUN 22 07/21/2022   CO2 28 07/21/2022   TSH 4.72 07/21/2022   INR 1.0 04/09/2022   HGBA1C 5.2 07/21/2022   MICROALBUR 1.0 07/21/2022   Assessment/Plan:  Gloria [name] is a 89 y.o. White or Caucasian [  1] female with  has a past medical history of GERD (04/10/2007), HYPERLIPIDEMIA (04/13/2007), HYPERTENSION (04/10/2007), Impaired glucose tolerance (09/17/2011), OSTEOPOROSIS (04/10/2007), and Unspecified vitamin D deficiency (08/26/2009).  Moderate aortic stenosis Stable clinically, cont to follow  Vitamin D deficiency Last vitamin D Lab Results  Component  Value Date   VD25OH 24.70 (L) 07/21/2022   Low, to start oral replacement   Hyperlipidemia Lab Results  Component Value Date   LDLCALC 164 (H) 07/21/2022   Unocntrolled, now with good crestor 40 mg compliance, for f/u lab, goal ldl < 70   Essential hypertension BP Readings from Last 3 Encounters:  11/22/22 122/76  07/21/22 128/74  06/21/22 120/68   Stable, pt to continue medical treatment lopressor 12.5 bid   Impaired glucose tolerance Lab Results  Component Value Date   HGBA1C 5.2 07/21/2022   Stable, pt to continue current medical treatment  - diet, wt control   UTI (urinary tract infection) Asympt now, for f/u culture  Iron deficiency anemia No recent overt bleeding,  bruising - for f/u lab  Followup: Return in about 6 months (around 05/25/2023).  Oliver Barre, MD 11/22/2022 10:25 AM [facility] - [facility] Internal Medicine

## 2022-11-22 NOTE — Assessment & Plan Note (Signed)
No recent overt bleeding,  bruising - for f/u lab

## 2022-11-22 NOTE — Patient Instructions (Signed)

## 2022-11-22 NOTE — Assessment & Plan Note (Signed)
Asympt now, for f/u culture

## 2022-11-23 ENCOUNTER — Ambulatory Visit: Payer: PPO

## 2022-11-25 ENCOUNTER — Other Ambulatory Visit: Payer: Self-pay | Admitting: Internal Medicine

## 2022-11-25 LAB — URINE CULTURE

## 2022-11-25 MED ORDER — CIPROFLOXACIN HCL 500 MG PO TABS: 500.0000 mg | ORAL_TABLET | Freq: Two times a day (BID) | ORAL | 0 refills | Status: AC

## 2023-01-24 ENCOUNTER — Other Ambulatory Visit: Payer: Self-pay

## 2023-04-03 ENCOUNTER — Other Ambulatory Visit: Payer: Self-pay

## 2023-04-03 ENCOUNTER — Telehealth: Payer: Self-pay | Admitting: Internal Medicine

## 2023-04-03 MED ORDER — METOPROLOL TARTRATE 25 MG PO TABS: 12.5000 mg | ORAL_TABLET | Freq: Two times a day (BID) | ORAL | 3 refills | Status: DC

## 2023-04-03 MED ORDER — METOPROLOL TARTRATE 25 MG PO TABS: 12.5000 mg | ORAL_TABLET | Freq: Two times a day (BID) | ORAL | 11 refills | Status: DC

## 2023-04-03 NOTE — Telephone Encounter (Signed)
Refill sent.

## 2023-04-03 NOTE — Telephone Encounter (Signed)
They would like to know if they can get a 90 day supply.   Prescription Request  04/03/2023  LOV: 11/22/2022  What is the name of the medication or equipment?  metoprolol tartrate (LOPRESSOR) 25 MG tablet  Have you contacted your pharmacy to request a refill? Yes   Which pharmacy would you like this sent to?  [facility] [address] [place] - [address] [place] [address] Phone: [phone] Fax: [phone]    Patient notified that their request is being sent to the clinical staff for review and that they should receive a response within 2 business days.   Please advise at Mobile [phone] (mobile)

## 2023-04-04 ENCOUNTER — Encounter: Payer: Self-pay | Admitting: Pharmacist

## 2023-04-04 NOTE — Progress Notes (Signed)
Pharmacy Quality Measure Review  This patient is appearing on a report for being at risk of failing the adherence measure for cholesterol (statin) medications this calendar year.   Medication: rosuvastatin 40 mg daily Last fill date: 04/02/2023 for 90 day supply  Insurance report was not up to date. No action needed at this time.    Arbutus Leas, PharmD, BCPS [facility] [phone]

## 2023-05-23 ENCOUNTER — Ambulatory Visit (INDEPENDENT_AMBULATORY_CARE_PROVIDER_SITE_OTHER): Payer: PPO | Admitting: Internal Medicine

## 2023-05-23 ENCOUNTER — Encounter: Payer: Self-pay | Admitting: Internal Medicine

## 2023-05-23 VITALS — BP 154/88 | HR 60 | Temp 98.2°F | Ht 63.0 in | Wt 136.0 lb

## 2023-05-23 DIAGNOSIS — R7302 Impaired glucose tolerance (oral): Secondary | ICD-10-CM | POA: Diagnosis not present

## 2023-05-23 DIAGNOSIS — I10 Essential (primary) hypertension: Secondary | ICD-10-CM

## 2023-05-23 DIAGNOSIS — I340 Nonrheumatic mitral (valve) insufficiency: Secondary | ICD-10-CM

## 2023-05-23 DIAGNOSIS — N1831 Chronic kidney disease, stage 3a: Secondary | ICD-10-CM

## 2023-05-23 DIAGNOSIS — I350 Nonrheumatic aortic (valve) stenosis: Secondary | ICD-10-CM

## 2023-05-23 DIAGNOSIS — E559 Vitamin D deficiency, unspecified: Secondary | ICD-10-CM

## 2023-05-23 DIAGNOSIS — Z23 Encounter for immunization: Secondary | ICD-10-CM

## 2023-05-23 DIAGNOSIS — G3184 Mild cognitive impairment, so stated: Secondary | ICD-10-CM | POA: Diagnosis not present

## 2023-05-23 DIAGNOSIS — R739 Hyperglycemia, unspecified: Secondary | ICD-10-CM

## 2023-05-23 NOTE — Patient Instructions (Signed)
Please continue all other medications as before, and refills have been done if requested.  Please have the pharmacy call with any other refills you may need.  Please continue your efforts at being more active, low cholesterol diet, and weight control.  Please keep your appointments with your specialists as you may have planned  Please make an Appointment to return in 6 months, or sooner if needed 

## 2023-05-23 NOTE — Progress Notes (Unsigned)
Patient ID: Gloria [name], female   DOB: [birth year], 89 y.o.   MRN: [id number]        Chief Complaint: follow up htn, cognitive slowing, most AS, mild  MR, low vit d, hyperlgycemia       HPI:  Gloria [name] is a 89 y.o. female here overall doing ok Pt denies chest pain, increased sob or doe, wheezing, orthopnea, PND, increased LE swelling, palpitations, dizziness or syncope.   Pt denies polydipsia, polyuria, or new focal neuro s/s.    Pt denies fever, wt loss, night sweats, loss of appetite, or other constitutional symptoms  Son with her confirms mild worsening ST memory and cognitive slowing.  No longer driving.        Wt Readings from Last 3 Encounters:  05/23/23 136 lb (61.7 kg)  11/22/22 125 lb (56.7 kg)  11/21/22 123 lb (55.8 kg)   BP Readings from Last 3 Encounters:  05/23/23 (!) 154/88  11/22/22 122/76  07/21/22 128/74         Past Medical History:  Diagnosis Date   GERD 04/10/2007   Qualifier: Diagnosis of  By: Maris Berger    HYPERLIPIDEMIA 04/13/2007   Qualifier: Diagnosis of  By: Jonny Ruiz MD, Len Blalock    HYPERTENSION 04/10/2007   Qualifier: Diagnosis of  By: Maris Berger    Impaired glucose tolerance 09/17/2011   OSTEOPOROSIS 04/10/2007   Qualifier: Diagnosis of  By: Maris Berger    Unspecified vitamin D deficiency 08/26/2009   Centricity Description: UNSPECIFIED VITAMIN D DEFICIENCY Qualifier: Diagnosis of  By: Jonny Ruiz MD, Len Blalock  Centricity Description: VITAMIN D DEFICIENCY Qualifier: Diagnosis of  By: Jonny Ruiz MD, Len Blalock    Past Surgical History:  Procedure Laterality Date   CATARACT EXTRACTION      reports that she has never smoked. She has never used smokeless tobacco. She reports that she does not drink alcohol and does not use drugs. family history includes Heart disease in her father; Stroke in her sister. No Known Allergies Current Outpatient Medications on File Prior to Visit  Medication Sig Dispense Refill   acetaminophen (TYLENOL)  325 MG tablet Take 2 tablets (650 mg total) by mouth every 6 (six) hours as needed for mild pain (or Fever >/= 101).     feeding supplement (ENSURE ENLIVE / ENSURE PLUS) LIQD Take 237 mLs by mouth daily. 237 mL 12   fiber supplement, BANATROL TF, liquid Take 60 mLs by mouth 2 (two) times daily.     furosemide (LASIX) 20 MG tablet Take 1 tablet (20 mg total) by mouth daily. 30 tablet 3   metoprolol tartrate (LOPRESSOR) 25 MG tablet Take 0.5 tablets (12.5 mg total) by mouth 2 (two) times daily. 90 tablet 3   nitrofurantoin (MACRODANTIN) 50 MG capsule Take 1 capsule (50 mg total) by mouth 4 (four) times daily. 40 capsule 0   nystatin cream (MYCOSTATIN) Apply 1 Application topically 2 (two) times daily. 30 g 2   polyethylene glycol (MIRALAX / GLYCOLAX) 17 g packet Take 17 g by mouth 2 (two) times daily as needed for mild constipation. 14 each 0   potassium chloride (KLOR-CON 10) 10 MEQ tablet Take 1 tablet (10 mEq total) by mouth 2 (two) times daily. 60 tablet 11   rosuvastatin (CRESTOR) 40 MG tablet Take 1 tablet (40 mg total) by mouth daily. 90 tablet 3   senna-docusate (SENOKOT-S) 8.6-50 MG tablet Take 1 tablet by mouth 2 (two) times daily as needed for moderate  constipation.     potassium chloride (KLOR-CON M) 10 MEQ tablet Take 1 tablet (10 mEq total) by mouth daily. 30 tablet 1   No current facility-administered medications on file prior to visit.        ROS:  All others reviewed and negative.  Objective        PE:  BP (!) 154/88 (BP Location: Right Arm, Patient Position: Sitting, Cuff Size: Normal)   Pulse 60   Temp 98.2 F (36.8 C) (Oral)   Ht 5\' 3"  (1.6 m)   Wt 136 lb (61.7 kg)   SpO2 97%   BMI 24.09 kg/m                 Constitutional: Pt appears in NAD               HENT: Head: NCAT.                Right Ear: External ear normal.                 Left Ear: External ear normal.                Eyes: . Pupils are equal, round, and reactive to light. Conjunctivae and EOM are  normal               Nose: without d/c or deformity               Neck: Neck supple. Gross normal ROM               Cardiovascular: Normal rate and regular rhythm.                 Pulmonary/Chest: Effort normal and breath sounds without rales or wheezing.                Abd:  Soft, NT, ND, + BS, no organomegaly               Neurological: Pt is alert. At baseline orientation, motor grossly intact               Skin: Skin is warm. No rashes, no other new lesions, LE edema - none               Psychiatric: Pt behavior is normal without agitation   Micro: none  Cardiac tracings I have personally interpreted today:  none  Pertinent Radiological findings (summarize): none   Lab Results  Component Value Date   WBC 10.8 (H) 11/22/2022   HGB 11.3 (L) 11/22/2022   HCT 32.7 (L) 11/22/2022   PLT 366.0 11/22/2022   GLUCOSE 105 (H) 11/22/2022   CHOL 134 11/22/2022   TRIG 93.0 11/22/2022   HDL 50.00 11/22/2022   LDLCALC 65 11/22/2022   ALT 30 11/22/2022   AST 22 11/22/2022   NA 136 11/22/2022   K 4.2 11/22/2022   CL 98 11/22/2022   CREATININE 0.80 11/22/2022   BUN 16 11/22/2022   CO2 29 11/22/2022   TSH 2.00 11/22/2022   INR 1.0 04/09/2022   HGBA1C 6.2 11/22/2022   MICROALBUR <0.7 11/22/2022   Assessment/Plan:  Gloria [name] is a 89 y.o. White or Caucasian [1] female with  has a past medical history of GERD (04/10/2007), HYPERLIPIDEMIA (04/13/2007), HYPERTENSION (04/10/2007), Impaired glucose tolerance (09/17/2011), OSTEOPOROSIS (04/10/2007), and Unspecified vitamin D deficiency (08/26/2009).  Vitamin D deficiency Last vitamin D Lab Results  Component Value Date   VD25OH 23.63 (L) 11/22/2022  Low, to start oral replacement   Impaired glucose tolerance Lab Results  Component Value Date   HGBA1C 6.2 11/22/2022   Stable, pt to continue current medical treatment  - diet,w t control   Essential hypertension BP Readings from Last 3 Encounters:  05/23/23 (!) 154/88  11/22/22  122/76  07/21/22 128/74   Uncontrolled, but pt and son state controlled at home, pt to continue medical treatment lopressor 12.5 bid, delcines change today   Moderate aortic stenosis Asympt, continue current med tx, declines cardiology f/u for now  Mild mitral regurgitation Stable, cont current med tx, declines f/u cards fo rnow  Hyperglycemia Lab Results  Component Value Date   HGBA1C 6.2 11/22/2022   Stable, pt to continue current medical treatment  - diet, wt control   Stage 3a chronic kidney disease (CKD) (HCC) Lab Results  Component Value Date   CREATININE 0.80 11/22/2022   Stable overall, cont to avoid nephrotoxins  Mild cognitive impairment Recent onset mild worsening, son declines MRI or neurology referral for now  Followup: Return in about 6 months (around 11/20/2023).  Oliver Barre, MD 05/24/2023 9:14 PM [facility] - [facility] Internal Medicine

## 2023-05-24 ENCOUNTER — Encounter: Payer: Self-pay | Admitting: Internal Medicine

## 2023-05-24 DIAGNOSIS — G3184 Mild cognitive impairment, so stated: Secondary | ICD-10-CM | POA: Insufficient documentation

## 2023-05-24 DIAGNOSIS — I340 Nonrheumatic mitral (valve) insufficiency: Secondary | ICD-10-CM | POA: Insufficient documentation

## 2023-05-24 NOTE — Assessment & Plan Note (Signed)
Lab Results  Component Value Date   HGBA1C 6.2 11/22/2022   Stable, pt to continue current medical treatment  - diet,w t control

## 2023-05-24 NOTE — Assessment & Plan Note (Signed)
Asympt, continue current med tx, declines cardiology f/u for now

## 2023-05-24 NOTE — Assessment & Plan Note (Signed)
Last vitamin D Lab Results  Component Value Date   VD25OH 23.63 (L) 11/22/2022   Low, to start oral replacement

## 2023-05-24 NOTE — Assessment & Plan Note (Signed)
Lab Results  Component Value Date   CREATININE 0.80 11/22/2022   Stable overall, cont to avoid nephrotoxins

## 2023-05-24 NOTE — Assessment & Plan Note (Signed)
Recent onset mild worsening, son declines MRI or neurology referral for now

## 2023-05-24 NOTE — Assessment & Plan Note (Signed)
Stable, cont current med tx, declines f/u cards fo rnow

## 2023-05-24 NOTE — Assessment & Plan Note (Signed)
BP Readings from Last 3 Encounters:  05/23/23 (!) 154/88  11/22/22 122/76  07/21/22 128/74   Uncontrolled, but pt and son state controlled at home, pt to continue medical treatment lopressor 12.5 bid, delcines change today

## 2023-06-21 ENCOUNTER — Telehealth: Payer: Self-pay | Admitting: Internal Medicine

## 2023-06-21 MED ORDER — ROSUVASTATIN CALCIUM 40 MG PO TABS: 40.0000 mg | ORAL_TABLET | Freq: Every day | ORAL | 3 refills | Status: DC

## 2023-06-21 MED ORDER — METOPROLOL SUCCINATE ER 25 MG PO TB24: 25.0000 mg | ORAL_TABLET | Freq: Every day | ORAL | 3 refills | Status: DC

## 2023-06-21 MED ORDER — POTASSIUM CHLORIDE ER 10 MEQ PO TBCR: 10.0000 meq | EXTENDED_RELEASE_TABLET | Freq: Two times a day (BID) | ORAL | 3 refills | Status: DC

## 2023-06-21 NOTE — Telephone Encounter (Signed)
Patient's nephew called and said patient's assisted living facility is requesting medication changes. They would like for metoprolol tartrate (LOPRESSOR) 25 MG tablet, potassium chloride (KLOR-CON M) 10 MEQ tablet, and rosuvastatin (CRESTOR) 40 MG tablet to be changed to 100 day supply blister packs. Patient's nephew also requested the extended release version of metoprolol so that she does not need to take the 1/2 pill twice a day. Mellody Dance would like a call back at [phone].

## 2023-06-21 NOTE — Telephone Encounter (Signed)
Please call nephew at [phone]

## 2023-06-21 NOTE — Telephone Encounter (Signed)
Ok all this is done , and done erx to [employer]

## 2023-06-22 ENCOUNTER — Encounter: Payer: Self-pay | Admitting: Internal Medicine

## 2023-06-22 MED ORDER — ROSUVASTATIN CALCIUM 40 MG PO TABS: 40.0000 mg | ORAL_TABLET | Freq: Every day | ORAL | 3 refills | Status: DC

## 2023-06-22 MED ORDER — METOPROLOL SUCCINATE ER 25 MG PO TB24: 25.0000 mg | ORAL_TABLET | Freq: Every day | ORAL | 3 refills | Status: DC

## 2023-06-22 MED ORDER — POTASSIUM CHLORIDE ER 10 MEQ PO TBCR: 10.0000 meq | EXTENDED_RELEASE_TABLET | Freq: Two times a day (BID) | ORAL | 3 refills | Status: DC

## 2023-06-22 NOTE — Telephone Encounter (Signed)
Sorry I am unable to call family or patients - I can answer specific questions if needed, though, thanks

## 2023-06-26 ENCOUNTER — Other Ambulatory Visit: Payer: Self-pay | Admitting: Internal Medicine

## 2023-06-26 MED ORDER — ROSUVASTATIN CALCIUM 40 MG PO TABS: 40.0000 mg | ORAL_TABLET | Freq: Every day | ORAL | 3 refills | Status: DC

## 2023-06-26 MED ORDER — POTASSIUM CHLORIDE ER 10 MEQ PO TBCR: 10.0000 meq | EXTENDED_RELEASE_TABLET | Freq: Every day | ORAL | 3 refills | Status: DC

## 2023-06-26 MED ORDER — METOPROLOL SUCCINATE ER 25 MG PO TB24: 25.0000 mg | ORAL_TABLET | Freq: Every day | ORAL | 3 refills | Status: DC

## 2023-06-26 NOTE — Telephone Encounter (Signed)
Prescriptions have been picked up.

## 2023-07-14 ENCOUNTER — Telehealth: Payer: Self-pay

## 2023-07-14 NOTE — Telephone Encounter (Signed)
 Copied from CRM [id number]. Topic: Medical Record Request - Provider/Facility Request >> Jul 14, 2023  9:25 AM Franky GRADE wrote: Reason for CRM: RN Sabrina Parkin, is calling from [facility] facility where patient is living to obtain a copy of the prescription and instructions on the change in how patient is suppose to take her metoprolol  succinate (TOPROL -XL) 25 MG 24 hr tablet. Fax number is [phone] Phone number [phone]

## 2023-07-17 ENCOUNTER — Other Ambulatory Visit: Payer: Self-pay | Admitting: Internal Medicine

## 2023-07-17 MED ORDER — METOPROLOL SUCCINATE ER 25 MG PO TB24: 25.0000 mg | ORAL_TABLET | Freq: Every day | ORAL | 3 refills | Status: DC

## 2023-07-17 NOTE — Progress Notes (Signed)
 Ok done hardcopy to staff

## 2023-07-17 NOTE — Telephone Encounter (Signed)
 Form has been faxed.

## 2023-09-21 ENCOUNTER — Other Ambulatory Visit: Payer: Self-pay

## 2023-09-25 ENCOUNTER — Other Ambulatory Visit (HOSPITAL_COMMUNITY): Payer: Self-pay

## 2023-09-26 ENCOUNTER — Other Ambulatory Visit: Payer: Self-pay

## 2023-10-17 DIAGNOSIS — B351 Tinea unguium: Secondary | ICD-10-CM | POA: Diagnosis not present

## 2023-11-06 ENCOUNTER — Other Ambulatory Visit: Payer: Self-pay

## 2023-11-06 ENCOUNTER — Encounter (HOSPITAL_BASED_OUTPATIENT_CLINIC_OR_DEPARTMENT_OTHER): Payer: Self-pay

## 2023-11-06 ENCOUNTER — Emergency Department (HOSPITAL_BASED_OUTPATIENT_CLINIC_OR_DEPARTMENT_OTHER)
Admission: EM | Admit: 2023-11-06 | Discharge: 2023-11-06 | Disposition: A | Discharge status: Home / Self Care | Attending: Emergency Medicine | Admitting: Emergency Medicine

## 2023-11-06 DIAGNOSIS — Z79899 Other long term (current) drug therapy: Secondary | ICD-10-CM | POA: Insufficient documentation

## 2023-11-06 DIAGNOSIS — R221 Localized swelling, mass and lump, neck: Secondary | ICD-10-CM | POA: Insufficient documentation

## 2023-11-06 DIAGNOSIS — L720 Epidermal cyst: Secondary | ICD-10-CM | POA: Diagnosis present

## 2023-11-06 DIAGNOSIS — L729 Follicular cyst of the skin and subcutaneous tissue, unspecified: Secondary | ICD-10-CM

## 2023-11-06 MED ORDER — LIDOCAINE-EPINEPHRINE (PF) 2 %-1:200000 IJ SOLN
10.0000 mL | Freq: Once | INTRAMUSCULAR | Status: AC
  Administered 2023-11-06: 10 mL
  Filled 2023-11-06: qty 20

## 2023-11-06 NOTE — ED Triage Notes (Signed)
 Pt presents to ED from home C/O lump on neck. Pt reports lump appeared on Saturday, is tender to touch. Denies drainage from area.

## 2023-11-06 NOTE — Discharge Instructions (Signed)
 There was a cyst on your skin.  It was drained and the contents came out.  It did not appear to be infected.  Watch for signs of infection.  Keep it clean.  Follow-up with her doctor as needed.

## 2023-11-06 NOTE — ED Provider Notes (Signed)
 [facility] Provider Note   CSN: [id number] Arrival date & time: 11/06/23  0935     History  Chief Complaint  Patient presents with   Cyst    Gloria [name] is a 89 y.o. female.  HPI Patient presents with swelling on the right posterior neck.  Began around 2 days ago.  No fevers.  No drainage.    Home Medications Prior to Admission medications   Medication Sig Start Date End Date Taking? Authorizing Provider  acetaminophen  (TYLENOL ) 325 MG tablet Take 2 tablets (650 mg total) by mouth every 6 (six) hours as needed for mild pain (or Fever >/= 101). 04/16/22   Gonfa, Taye T, MD  feeding supplement (ENSURE ENLIVE / ENSURE PLUS) LIQD Take 237 mLs by mouth daily. 04/17/22   Gonfa, Taye T, MD  fiber supplement, BANATROL TF, liquid Take 60 mLs by mouth 2 (two) times daily. 04/16/22   Gonfa, Taye T, MD  furosemide  (LASIX ) 20 MG tablet Take 1 tablet (20 mg total) by mouth daily. 05/11/22   Sagardia, Miguel Jose, MD  metoprolol  succinate (TOPROL -XL) 25 MG 24 hr tablet Take 1 tablet (25 mg total) by mouth daily. 07/17/23   Roslyn Coombe, MD  nitrofurantoin  (MACRODANTIN ) 50 MG capsule Take 1 capsule (50 mg total) by mouth 4 (four) times daily. 07/26/22   Roslyn Coombe, MD  nystatin  cream (MYCOSTATIN ) Apply 1 Application topically 2 (two) times daily. 06/21/22   Roslyn Coombe, MD  polyethylene glycol (MIRALAX  / GLYCOLAX ) 17 g packet Take 17 g by mouth 2 (two) times daily as needed for mild constipation. 04/16/22   Gonfa, Taye T, MD  potassium chloride  (KLOR-CON  10) 10 MEQ tablet Take 1 tablet (10 mEq total) by mouth daily. 06/26/23   Roslyn Coombe, MD  rosuvastatin  (CRESTOR ) 40 MG tablet Take 1 tablet (40 mg total) by mouth daily. 06/26/23   Roslyn Coombe, MD  senna-docusate (SENOKOT-S) 8.6-50 MG tablet Take 1 tablet by mouth 2 (two) times daily as needed for moderate constipation. 04/16/22   Gonfa, Taye T, MD      Allergies    Patient has no known  allergies.    Review of Systems   Review of Systems  Physical Exam Updated Vital Signs BP (!) 171/92   Pulse 83   Temp 97.8 F (36.6 C) (Oral)   Resp 16   Ht 5\' 3"  (1.6 m)   Wt 63.5 kg   SpO2 97%   BMI 24.80 kg/m  Physical Exam Vitals and nursing note reviewed.  Neck:     Comments: Posterior neck there is a lower mass approximately 2 cm x 1 and half centimeter.  It is raised and firm.  Some tenderness. Neurological:     Mental Status: She is alert.     ED Results / Procedures / Treatments   Labs (all labs ordered are listed, but only abnormal results are displayed) Labs Reviewed - No data to display  EKG None  Radiology No results found.  Procedures .Incision and Drainage  Date/Time: 11/06/2023 10:10 AM  Performed by: Mozell Arias, MD Authorized by: Mozell Arias, MD   Consent:    Consent obtained:  Verbal   Consent given by:  Patient and healthcare agent   Risks, benefits, and alternatives were discussed: yes     Risks discussed:  Bleeding, incomplete drainage, damage to other organs, infection and pain   Alternatives discussed:  No treatment and delayed treatment Location:  Type:  Cyst   Size:  2 cm   Location:  Neck   Neck location:  R posterior Pre-procedure details:    Skin preparation:  Chlorhexidine  Sedation:    Sedation type:  None Anesthesia:    Anesthesia method:  Local infiltration   Local anesthetic:  Lidocaine 2% WITH epi Procedure type:    Complexity:  Simple Procedure details:    Ultrasound guidance: no     Needle aspiration: no     Incision types:  Single straight   Incision depth:  Subcutaneous   Wound management:  Probed and deloculated   Drainage characteristics: thick.   Drainage amount:  Moderate   Wound treatment:  Wound left open Post-procedure details:    Procedure completion:  Tolerated well, no immediate complications     Medications Ordered in ED Medications  lidocaine-EPINEPHrine (XYLOCAINE W/EPI) 2  %-1:200000 (PF) injection 10 mL (10 mLs Infiltration Given by Other 11/06/23 1003)    ED Course/ Medical Decision Making/ A&P                                 Medical Decision Making Risk Prescription drug management.   Patient with mass on right neck.  Differential diagnosis includes cysts abscesses or other masses.  Did appear relatively quickly with the beginning 2 days ago.  Will do incision and drainage.  Feels better after drainage.  Discharge home.  Does not appear to be infected.        Final Clinical Impression(s) / ED Diagnoses Final diagnoses:  Skin cyst    Rx / DC Orders ED Discharge Orders     None         Mozell Arias, MD 11/06/23 1016

## 2023-11-06 NOTE — ED Notes (Addendum)
 Gauze dressing applied to neck after site was drained by provider.

## 2023-11-20 ENCOUNTER — Ambulatory Visit: Payer: PPO | Admitting: Internal Medicine

## 2023-11-22 ENCOUNTER — Ambulatory Visit (INDEPENDENT_AMBULATORY_CARE_PROVIDER_SITE_OTHER): Payer: PPO

## 2023-11-22 VITALS — Ht 63.0 in | Wt 137.0 lb

## 2023-11-22 DIAGNOSIS — Z0000 Encounter for general adult medical examination without abnormal findings: Secondary | ICD-10-CM

## 2023-11-22 NOTE — Progress Notes (Signed)
 Subjective:  Please attest and cosign this visit due to patients primary care provider not being in the office at the time the visit was completed.  (Pt of Dr Rosalia Colonel)   Gloria [name] is a 89 y.o. who presents for a Medicare Wellness preventive visit.  As a reminder, Annual Wellness Visits don't include a physical exam, and some assessments may be limited, especially if this visit is performed virtually. We may recommend an in-person visit if needed.  Visit Complete: Virtual I connected with  Gloria [name] on 11/22/23 by a audio enabled telemedicine application and verified that I am speaking with the correct person using two identifiers.  Patient Location: Home  Provider Location: Office/Clinic  I discussed the limitations of evaluation and management by telemedicine. The patient expressed understanding and agreed to proceed.  Vital Signs: Because this visit was a virtual/telehealth visit, some criteria may be missing or patient reported. Any vitals not documented were not able to be obtained and vitals that have been documented are patient reported.  VideoDeclined- This patient declined Librarian, academic. Therefore the visit was completed with audio only.  Persons Participating in Visit: Patient.  AWV Questionnaire: No: Patient Medicare AWV questionnaire was not completed prior to this visit.  Cardiac Risk Factors include: advanced age (>66men, >43 women);hypertension;dyslipidemia     Objective:     Today's Vitals   11/22/23 0933  Weight: 137 lb (62.1 kg)  Height: 5\' 3"  (1.6 m)   Body mass index is 24.27 kg/m.     11/22/2023    9:33 AM 11/21/2022    9:18 AM 06/04/2022   11:29 PM 04/13/2022    7:00 PM 04/09/2022   11:30 AM 11/16/2021    9:48 AM 05/06/2020   12:46 PM  Advanced Directives  Does Patient Have a Medical Advance Directive? Yes Yes Yes No No Yes Yes  Type of Estate agent of [place];Living will  Healthcare Power of [place];Living will Out of facility DNR (pink MOST or yellow form)   Living will;Healthcare Power of [employer] of [place];Living will  Does patient want to make changes to medical advance directive?   Yes (ED - Information included in AVS)   No - Patient declined No - Patient declined  Copy of Healthcare Power of Attorney in Chart? No - copy requested No - copy requested    No - copy requested No - copy requested    Current Medications (verified) Outpatient Encounter Medications as of 11/22/2023  Medication Sig   acetaminophen  (TYLENOL ) 325 MG tablet Take 2 tablets (650 mg total) by mouth every 6 (six) hours as needed for mild pain (or Fever >/= 101).   feeding supplement (ENSURE ENLIVE / ENSURE PLUS) LIQD Take 237 mLs by mouth daily.   fiber supplement, BANATROL TF, liquid Take 60 mLs by mouth 2 (two) times daily.   furosemide  (LASIX ) 20 MG tablet Take 1 tablet (20 mg total) by mouth daily.   metoprolol  succinate (TOPROL -XL) 25 MG 24 hr tablet Take 1 tablet (25 mg total) by mouth daily.   nitrofurantoin  (MACRODANTIN ) 50 MG capsule Take 1 capsule (50 mg total) by mouth 4 (four) times daily.   nystatin  cream (MYCOSTATIN ) Apply 1 Application topically 2 (two) times daily.   polyethylene glycol (MIRALAX  / GLYCOLAX ) 17 g packet Take 17 g by mouth 2 (two) times daily as needed for mild constipation.   potassium chloride  (KLOR-CON  10) 10 MEQ tablet Take 1 tablet (10 mEq total)  by mouth daily.   rosuvastatin  (CRESTOR ) 40 MG tablet Take 1 tablet (40 mg total) by mouth daily.   senna-docusate (SENOKOT-S) 8.6-50 MG tablet Take 1 tablet by mouth 2 (two) times daily as needed for moderate constipation.   No facility-administered encounter medications on file as of 11/22/2023.    Allergies (verified) Patient has no known allergies.   History: Past Medical History:  Diagnosis Date   GERD 04/10/2007   Qualifier: Diagnosis of  By: Jarold Merlin     HYPERLIPIDEMIA 04/13/2007   Qualifier: Diagnosis of  By: Autry Legions MD, Alveda Aures    HYPERTENSION 04/10/2007   Qualifier: Diagnosis of  By: Jarold Merlin    Impaired glucose tolerance 09/17/2011   OSTEOPOROSIS 04/10/2007   Qualifier: Diagnosis of  By: Jarold Merlin    Unspecified vitamin D  deficiency 08/26/2009   Centricity Description: UNSPECIFIED VITAMIN D  DEFICIENCY Qualifier: Diagnosis of  By: Autry Legions MD, Alveda Aures  Centricity Description: VITAMIN D  DEFICIENCY Qualifier: Diagnosis of  By: Autry Legions MD, Alveda Aures    Past Surgical History:  Procedure Laterality Date   CATARACT EXTRACTION     Family History  Problem Relation Age of Onset   Heart disease Father    Stroke Sister    Social History   Socioeconomic History   Marital status: Widowed    Spouse name: Not on file   Number of children: Not on file   Years of education: Not on file   Highest education level: Not on file  Occupational History   Not on file  Tobacco Use   Smoking status: Never    Passive exposure: Never   Smokeless tobacco: Never  Substance and Sexual Activity   Alcohol use: No   Drug use: No   Sexual activity: Not Currently  Other Topics Concern   Not on file  Social History Narrative   Widowed   Social Drivers of Health   Financial Resource Strain: Low Risk  (11/22/2023)   Overall Financial Resource Strain (CARDIA)    Difficulty of Paying Living Expenses: Not hard at all  Food Insecurity: No Food Insecurity (11/22/2023)   Hunger Vital Sign    Worried About Running Out of Food in the Last Year: Never true    Ran Out of Food in the Last Year: Never true  Transportation Needs: No Transportation Needs (11/22/2023)   PRAPARE - Administrator, Civil Service (Medical): No    Lack of Transportation (Non-Medical): No  Physical Activity: Insufficiently Active (11/22/2023)   Exercise Vital Sign    Days of Exercise per Week: 7 days    Minutes of Exercise per Session: 20 min  Stress: No Stress  Concern Present (11/22/2023)   [employer] of Occupational Health - Occupational Stress Questionnaire    Feeling of Stress : Not at all  Social Connections: Moderately Integrated (11/22/2023)   Social Connection and Isolation Panel [NHANES]    Frequency of Communication with Friends and Family: More than three times a week    Frequency of Social Gatherings with Friends and Family: More than three times a week    Attends Religious Services: More than 4 times per year    Active Member of [employer] or Organizations: Yes    Attends Banker Meetings: More than 4 times per year    Marital Status: Widowed    Tobacco Counseling Counseling given: No    Clinical Intake:  Pre-visit preparation completed: Yes  Pain : No/denies pain  BMI - recorded: 24.27 Nutritional Status: BMI 25 -29 Overweight Nutritional Risks: None Diabetes: No  Lab Results  Component Value Date   HGBA1C 6.2 11/22/2022   HGBA1C 5.2 07/21/2022   HGBA1C 4.8 06/05/2022     How often do you need to have someone help you when you read instructions, pamphlets, or other written materials from your doctor or pharmacy?: 1 - Never  Interpreter Needed?: No  Information entered by :: Freddrick Jaffe, CMA   Activities of Daily Living     11/22/2023    9:36 AM  In your present state of health, do you have any difficulty performing the following activities:  Hearing? 0  Vision? 0  Difficulty concentrating or making decisions? 0  Walking or climbing stairs? 0  Dressing or bathing? 0  Doing errands, shopping? 0  Preparing Food and eating ? N  Using the Toilet? N  In the past six months, have you accidently leaked urine? N  Do you have problems with loss of bowel control? N  Managing your Medications? N  Managing your Finances? N  Housekeeping or managing your Housekeeping? N    Patient Care Team: Roslyn Coombe, MD as PCP - General H[place], Aviva Lemmings, MD as PCP - Cardiology  (Cardiology) [facility] as Consulting Physician (Optometry)  Indicate any recent Medical Services you may have received from other than [facility] providers in the past year (date may be approximate).     Assessment:    This is a routine wellness examination for Gloria [name].  Hearing/Vision screen Hearing Screening - Comments:: Denies hearing difficulties   Vision Screening - Comments:: Wears eyeglasses for reading only - pt stated does not need to see an Ophthalmologist at this moment   Goals Addressed               This Visit's Progress     Patient Stated (pt-stated)        Patient stated she wants to keep walking and enjoy friends.       Depression Screen     11/22/2023    9:37 AM 05/23/2023    8:50 AM 11/22/2022    9:47 AM 11/21/2022    9:20 AM 07/21/2022   10:30 AM 05/11/2022    8:46 AM 11/16/2021    9:57 AM  PHQ 2/9 Scores  PHQ - 2 Score 0 0 0 0 0 0 0  PHQ- 9 Score 1  0 0   0    Fall Risk     11/22/2023    9:36 AM 05/23/2023    8:50 AM 11/22/2022    9:47 AM 11/21/2022    9:20 AM 07/21/2022   10:30 AM  Fall Risk   Falls in the past year? 0 0 0 0 0  Number falls in past yr: 0 0 0 0 0  Injury with Fall? 0 0 0 0 0  Risk for fall due to : No Fall Risks No Fall Risks No Fall Risks No Fall Risks   Follow up Falls prevention discussed;Falls evaluation completed Falls evaluation completed Falls evaluation completed Falls prevention discussed     MEDICARE RISK AT HOME:  Medicare Risk at Home Any stairs in or around the home?: No If so, are there any without handrails?: No Home free of loose throw rugs in walkways, pet beds, electrical cords, etc?: Yes Adequate lighting in your home to reduce risk of falls?: Yes Life alert?: No Use of a cane, walker or w/c?: No Grab  bars in the bathroom?: Yes Shower chair or bench in shower?: Yes Elevated toilet seat or a handicapped toilet?: Yes  TIMED UP AND GO:  Was the test performed?  No  Cognitive  Function: 6CIT completed        11/22/2023    9:40 AM 11/21/2022    9:26 AM 11/16/2021    9:58 AM  6CIT Screen  What Year? 0 points 0 points 0 points  What month? 0 points 0 points 0 points  What time? 0 points 0 points 0 points  Count back from 20 0 points 0 points 0 points  Months in reverse 4 points 0 points 0 points  Repeat phrase 2 points 0 points 0 points  Total Score 6 points 0 points 0 points    Immunizations Immunization History  Administered Date(s) Administered   Fluad Quad(high Dose 65+) 07/21/2022   Fluad Trivalent(High Dose 65+) 05/23/2023   Influenza Whole 05/18/2007   PFIZER(Purple Top)SARS-COV-2 Vaccination 03/09/2020, 03/30/2020   PPD Test 09/30/2022   Pneumococcal Conjugate-13 11/01/2013   Pneumococcal Polysaccharide-23 07/11/2005   Td 06/20/2008   Tdap 11/11/2020    Screening Tests Health Maintenance  Topic Date Due   INFLUENZA VACCINE  02/09/2024   Medicare Annual Wellness (AWV)  11/21/2024   DTaP/Tdap/Td (3 - Td or Tdap) 11/12/2030   Pneumonia Vaccine 48+ Years old  Completed   HPV VACCINES  Aged Out   Meningococcal B Vaccine  Aged Out   DEXA SCAN  Discontinued   COVID-19 Vaccine  Discontinued   Zoster Vaccines- Shingrix  Discontinued    Health Maintenance  There are no preventive care reminders to display for this patient. Health Maintenance Items Addressed: 11/22/2023   Additional Screening:  Vision Screening: Recommended annual ophthalmology exams for early detection of glaucoma and other disorders of the eye.  Dental Screening: Recommended annual dental exams for proper oral hygiene  Community Resource Referral / Chronic Care Management: CRR required this visit?  No   CCM required this visit?  No   Plan:    I have personally reviewed and noted the following in the patient's chart:   Medical and social history Use of alcohol, tobacco or illicit drugs  Current medications and supplements including opioid prescriptions. Patient  is not currently taking opioid prescriptions. Functional ability and status Nutritional status Physical activity Advanced directives List of other physicians Hospitalizations, surgeries, and ER visits in previous 12 months Vitals Screenings to include cognitive, depression, and falls Referrals and appointments  In addition, I have reviewed and discussed with patient certain preventive protocols, quality metrics, and best practice recommendations. A written personalized care plan for preventive services as well as general preventive health recommendations were provided to patient.   Patria Bookbinder, CMA   11/22/2023   After Visit Summary: (MyChart) Due to this being a telephonic visit, the after visit summary with patients personalized plan was offered to patient via MyChart   Notes: Nothing significant to report at this time.

## 2023-11-22 NOTE — Patient Instructions (Signed)
 Ms. Gloria [name] , Thank you for taking time out of your busy schedule to complete your Annual Wellness Visit with me. I enjoyed our conversation and look forward to speaking with you again next year. I, as well as your care team,  appreciate your ongoing commitment to your health goals. Please review the following plan we discussed and let me know if I can assist you in the future. Your Game plan/ To Do List   Follow up Visits: Next Medicare AWV with our clinical staff: 11/21/2024   Have you seen your provider in the last 6 months (3 months if uncontrolled diabetes)? Yes Next Office Visit with your provider: 11/28/2023  Clinician Recommendations:  Aim for 30 minutes of exercise or brisk walking, 6-8 glasses of water, and 5 servings of fruits and vegetables each day.       This is a list of the screening recommended for you and due dates:  Health Maintenance  Topic Date Due   Flu Shot  02/09/2024   Medicare Annual Wellness Visit  11/21/2024   DTaP/Tdap/Td vaccine (3 - Td or Tdap) 11/12/2030   Pneumonia Vaccine  Completed   HPV Vaccine  Aged Out   Meningitis B Vaccine  Aged Out   DEXA scan (bone density measurement)  Discontinued   COVID-19 Vaccine  Discontinued   Zoster (Shingles) Vaccine  Discontinued    Advanced directives: (Copy Requested) Please bring a copy of your health care power of attorney and living will to the office to be added to your chart at your convenience. You can mail to [facility] [address] [place], [place] [address] or email to [email or web address] Advance Care Planning is important because it:  [x]  Makes sure you receive the medical care that is consistent with your values, goals, and preferences  [x]  It provides guidance to your family and loved ones and reduces their decisional burden about whether or not they are making the right decisions based on your wishes.  Follow the link provided in your after visit summary or read over the  paperwork we have mailed to you to help you started getting your Advance Directives in place. If you need assistance in completing these, please reach out to us  so that we can help you!

## 2023-11-28 ENCOUNTER — Encounter: Payer: Self-pay | Admitting: Internal Medicine

## 2023-11-28 ENCOUNTER — Ambulatory Visit (INDEPENDENT_AMBULATORY_CARE_PROVIDER_SITE_OTHER): Admitting: Internal Medicine

## 2023-11-28 ENCOUNTER — Ambulatory Visit: Payer: Self-pay | Admitting: Internal Medicine

## 2023-11-28 VITALS — BP 132/78 | HR 63 | Temp 98.0°F | Ht 63.0 in | Wt 143.0 lb

## 2023-11-28 DIAGNOSIS — Z0000 Encounter for general adult medical examination without abnormal findings: Secondary | ICD-10-CM

## 2023-11-28 DIAGNOSIS — E559 Vitamin D deficiency, unspecified: Secondary | ICD-10-CM

## 2023-11-28 DIAGNOSIS — R739 Hyperglycemia, unspecified: Secondary | ICD-10-CM | POA: Diagnosis not present

## 2023-11-28 DIAGNOSIS — E538 Deficiency of other specified B group vitamins: Secondary | ICD-10-CM | POA: Diagnosis not present

## 2023-11-28 DIAGNOSIS — E7800 Pure hypercholesterolemia, unspecified: Secondary | ICD-10-CM

## 2023-11-28 DIAGNOSIS — N1831 Chronic kidney disease, stage 3a: Secondary | ICD-10-CM | POA: Diagnosis not present

## 2023-11-28 DIAGNOSIS — H6122 Impacted cerumen, left ear: Secondary | ICD-10-CM | POA: Diagnosis not present

## 2023-11-28 DIAGNOSIS — Z0001 Encounter for general adult medical examination with abnormal findings: Secondary | ICD-10-CM

## 2023-11-28 LAB — CBC WITH DIFFERENTIAL/PLATELET
Basophils Absolute: 0 10*3/uL (ref 0.0–0.1)
Basophils Relative: 0.3 % (ref 0.0–3.0)
Eosinophils Absolute: 0.1 10*3/uL (ref 0.0–0.7)
Eosinophils Relative: 0.8 % (ref 0.0–5.0)
HCT: 39.6 % (ref 36.0–46.0)
Hemoglobin: 13.4 g/dL (ref 12.0–15.0)
Lymphocytes Relative: 26.3 % (ref 12.0–46.0)
Lymphs Abs: 2 10*3/uL (ref 0.7–4.0)
MCHC: 34 g/dL (ref 30.0–36.0)
MCV: 89.3 fl (ref 78.0–100.0)
Monocytes Absolute: 0.5 10*3/uL (ref 0.1–1.0)
Monocytes Relative: 7 % (ref 3.0–12.0)
Neutro Abs: 4.9 10*3/uL (ref 1.4–7.7)
Neutrophils Relative %: 65.6 % (ref 43.0–77.0)
Platelets: 218 10*3/uL (ref 150.0–400.0)
RBC: 4.43 Mil/uL (ref 3.87–5.11)
RDW: 13 % (ref 11.5–15.5)
WBC: 7.4 10*3/uL (ref 4.0–10.5)

## 2023-11-28 LAB — BASIC METABOLIC PANEL WITH GFR
BUN: 21 mg/dL (ref 6–23)
CO2: 27 meq/L (ref 19–32)
Calcium: 10 mg/dL (ref 8.4–10.5)
Chloride: 103 meq/L (ref 96–112)
Creatinine, Ser: 0.82 mg/dL (ref 0.40–1.20)
GFR: 60.99 mL/min (ref >60.00)
Glucose, Bld: 105 mg/dL — ABNORMAL HIGH (ref 70–99)
Potassium: 4.3 meq/L (ref 3.5–5.1)
Sodium: 141 meq/L (ref 135–145)

## 2023-11-28 LAB — HEPATIC FUNCTION PANEL
ALT: 46 U/L — ABNORMAL HIGH (ref 0–35)
AST: 42 U/L — ABNORMAL HIGH (ref 0–37)
Albumin: 4.5 g/dL (ref 3.5–5.2)
Alkaline Phosphatase: 74 U/L (ref 39–117)
Bilirubin, Direct: 0.2 mg/dL (ref 0.0–0.3)
Total Bilirubin: 1.5 mg/dL — ABNORMAL HIGH (ref 0.2–1.2)
Total Protein: 7.6 g/dL (ref 6.0–8.3)

## 2023-11-28 LAB — LIPID PANEL
Cholesterol: 157 mg/dL (ref 0–200)
HDL: 50.6 mg/dL (ref >39.00)
LDL Cholesterol: 75 mg/dL (ref 0–99)
NonHDL: 106.84
Total CHOL/HDL Ratio: 3
Triglycerides: 158 mg/dL — ABNORMAL HIGH (ref 0.0–149.0)
VLDL: 31.6 mg/dL (ref 0.0–40.0)

## 2023-11-28 LAB — VITAMIN D 25 HYDROXY (VIT D DEFICIENCY, FRACTURES): VITD: 23.31 ng/mL — ABNORMAL LOW (ref 30.00–100.00)

## 2023-11-28 LAB — HEMOGLOBIN A1C: Hgb A1c MFr Bld: 5.6 % (ref 4.6–6.5)

## 2023-11-28 LAB — VITAMIN B12: Vitamin B-12: 315 pg/mL (ref 211–911)

## 2023-11-28 LAB — TSH: TSH: 4.34 u[IU]/mL (ref 0.35–5.50)

## 2023-11-28 MED ORDER — POTASSIUM CHLORIDE ER 10 MEQ PO TBCR: 10.0000 meq | EXTENDED_RELEASE_TABLET | Freq: Every day | ORAL | 3 refills | Status: AC

## 2023-11-28 MED ORDER — ROSUVASTATIN CALCIUM 40 MG PO TABS: 40.0000 mg | ORAL_TABLET | Freq: Every day | ORAL | 3 refills | Status: AC

## 2023-11-28 MED ORDER — METOPROLOL SUCCINATE ER 25 MG PO TB24: 25.0000 mg | ORAL_TABLET | Freq: Every day | ORAL | 3 refills | Status: AC

## 2023-11-28 NOTE — Patient Instructions (Addendum)
 Please have your Shingrix (shingles) shots done at your local pharmacy.  Your Left ear was cleared of wax today  Please continue all other medications as before, and refills have been done if requested.  Please have the pharmacy call with any other refills you may need.  Please continue your efforts at being more active, low cholesterol diet, and weight control.  You are otherwise up to date with prevention measures today.  Please keep your appointments with your specialists as you may have planned  Please go to the LAB at the blood drawing area for the tests to be done  You will be contacted by phone if any changes need to be made immediately.  Otherwise, you will receive a letter about your results with an explanation, but please check with MyChart first.  Please make an Appointment to return in 6 months, or sooner if needed

## 2023-11-28 NOTE — Progress Notes (Signed)
 Patient ID: Gloria [name], female   DOB: June 12, 1927, 89 y.o.   MRN: [id number]         Chief Complaint:: wellness exam and left ear cerumen impaction, low vit d, hld, hyperglycemia, ckd3a       HPI:  Gloria [name] is a 89 y.o. female here for wellness exam; for shingrix at pharmacy, o/w up to date                        Also Pt denies chest pain, increased sob or doe, wheezing, orthopnea, PND, increased LE swelling, palpitations, dizziness or syncope.   Pt denies polydipsia, polyuria, or new focal neuro s/s.    Pt denies fever, wt loss, night sweats, loss of appetite, or other constitutional symptoms  Has reduced hearing and wax to left ear canal.     Wt Readings from Last 3 Encounters:  11/28/23 143 lb (64.9 kg)  11/22/23 137 lb (62.1 kg)  11/06/23 140 lb (63.5 kg)   BP Readings from Last 3 Encounters:  11/28/23 132/78  11/06/23 (!) 171/92  05/23/23 (!) 154/88   Immunization History  Administered Date(s) Administered   Fluad Quad(high Dose 65+) 07/21/2022   Fluad Trivalent(High Dose 65+) 05/23/2023   Influenza Whole 05/18/2007   PFIZER(Purple Top)SARS-COV-2 Vaccination 03/09/2020, 03/30/2020   PPD Test 09/30/2022   Pneumococcal Conjugate-13 11/01/2013   Pneumococcal Polysaccharide-23 07/11/2005   Td 06/20/2008   Tdap 11/11/2020  There are no preventive care reminders to display for this patient.    Past Medical History:  Diagnosis Date   GERD 04/10/2007   Qualifier: Diagnosis of  By: Jarold Merlin    HYPERLIPIDEMIA 04/13/2007   Qualifier: Diagnosis of  By: Autry Legions MD, Alveda Aures    HYPERTENSION 04/10/2007   Qualifier: Diagnosis of  By: Jarold Merlin    Impaired glucose tolerance 09/17/2011   OSTEOPOROSIS 04/10/2007   Qualifier: Diagnosis of  By: Jarold Merlin    Unspecified vitamin D  deficiency 08/26/2009   Centricity Description: UNSPECIFIED VITAMIN D  DEFICIENCY Qualifier: Diagnosis of  By: Autry Legions MD, Alveda Aures  Centricity Description: VITAMIN D   DEFICIENCY Qualifier: Diagnosis of  By: Autry Legions MD, Alveda Aures    Past Surgical History:  Procedure Laterality Date   CATARACT EXTRACTION      reports that she has never smoked. She has never been exposed to tobacco smoke. She has never used smokeless tobacco. She reports that she does not drink alcohol and does not use drugs. family history includes Heart disease in her father; Stroke in her sister. No Known Allergies Current Outpatient Medications on File Prior to Visit  Medication Sig Dispense Refill   acetaminophen  (TYLENOL ) 325 MG tablet Take 2 tablets (650 mg total) by mouth every 6 (six) hours as needed for mild pain (or Fever >/= 101).     feeding supplement (ENSURE ENLIVE / ENSURE PLUS) LIQD Take 237 mLs by mouth daily. 237 mL 12   fiber supplement, BANATROL TF, liquid Take 60 mLs by mouth 2 (two) times daily.     furosemide  (LASIX ) 20 MG tablet Take 1 tablet (20 mg total) by mouth daily. 30 tablet 3   nitrofurantoin  (MACRODANTIN ) 50 MG capsule Take 1 capsule (50 mg total) by mouth 4 (four) times daily. 40 capsule 0   nystatin  cream (MYCOSTATIN ) Apply 1 Application topically 2 (two) times daily. 30 g 2   polyethylene glycol (MIRALAX  / GLYCOLAX ) 17 g packet Take 17 g by mouth 2 (two)  times daily as needed for mild constipation. 14 each 0   senna-docusate (SENOKOT-S) 8.6-50 MG tablet Take 1 tablet by mouth 2 (two) times daily as needed for moderate constipation.     No current facility-administered medications on file prior to visit.        ROS:  All others reviewed and negative.  Objective        PE:  BP 132/78 (BP Location: Right Arm, Patient Position: Sitting, Cuff Size: Normal)   Pulse 63   Temp 98 F (36.7 C) (Oral)   Ht 5\' 3"  (1.6 m)   Wt 143 lb (64.9 kg)   SpO2 95%   BMI 25.33 kg/m                 Constitutional: Pt appears in NAD               HENT: Head: NCAT.                Right Ear: External ear normal.                 Left Ear: External ear normal. Left cerumen  impaction cleared and hearing improvd               Eyes: . Pupils are equal, round, and reactive to light. Conjunctivae and EOM are normal               Nose: without d/c or deformity               Neck: Neck supple. Gross normal ROM               Cardiovascular: Normal rate and regular rhythm.                 Pulmonary/Chest: Effort normal and breath sounds without rales or wheezing.                Abd:  Soft, NT, ND, + BS, no organomegaly               Neurological: Pt is alert. At baseline orientation, motor grossly intact               Skin: Skin is warm. No rashes, no other new lesions, LE edema - none               Psychiatric: Pt behavior is normal without agitation   Micro: none  Cardiac tracings I have personally interpreted today:  none  Pertinent Radiological findings (summarize): none   Lab Results  Component Value Date   WBC 7.4 11/28/2023   HGB 13.4 11/28/2023   HCT 39.6 11/28/2023   PLT 218.0 11/28/2023   GLUCOSE 105 (H) 11/28/2023   CHOL 157 11/28/2023   TRIG 158.0 (H) 11/28/2023   HDL 50.60 11/28/2023   LDLCALC 75 11/28/2023   ALT 46 (H) 11/28/2023   AST 42 (H) 11/28/2023   NA 141 11/28/2023   K 4.3 11/28/2023   CL 103 11/28/2023   CREATININE 0.82 11/28/2023   BUN 21 11/28/2023   CO2 27 11/28/2023   TSH 4.34 11/28/2023   INR 1.0 04/09/2022   HGBA1C 5.6 11/28/2023   MICROALBUR <0.7 11/22/2022   Assessment/Plan:  Gloria [name] is a 89 y.o. White or Caucasian [1] female with  has a past medical history of GERD (04/10/2007), HYPERLIPIDEMIA (04/13/2007), HYPERTENSION (04/10/2007), Impaired glucose tolerance (09/17/2011), OSTEOPOROSIS (04/10/2007), and Unspecified vitamin D  deficiency (08/26/2009).  Encounter for well adult exam with  abnormal findings Age and sex appropriate education and counseling updated with regular exercise and diet Referrals for preventative services - none needed Immunizations addressed - for shingrix at pharmacy Smoking counseling  -  none needed Evidence for depression or other mood disorder - none significant Most recent labs reviewed. I have personally reviewed and have noted: 1) the patient's medical and social history 2) The patient's current medications and supplements 3) The patient's height, weight, and BMI have been recorded in the chart   Vitamin D  deficiency Last vitamin D  Lab Results  Component Value Date   VD25OH 23.31 (L) 11/28/2023   Low, to start oral replacement   Hyperlipidemia Lab Results  Component Value Date   LDLCALC 75 11/28/2023   Uncontrolled,, pt to continue current statin crestor  40 mg and dclines add zetia 10 qd   Stage 3a chronic kidney disease (CKD) (HCC) Lab Results  Component Value Date   CREATININE 0.82 11/28/2023   Stable overall, cont to avoid nephrotoxins   Hyperglycemia Lab Results  Component Value Date   HGBA1C 5.6 11/28/2023   Stable, pt to continue current medical treatment  - diet, wt control   Cerumen impaction Resolved and cleared with tx,  to f/u any worsening symptoms or concerns  Followup: Return in about 6 months (around 05/30/2024).  Rosalia Colonel, MD 11/29/2023 7:40 PM [facility]

## 2023-11-28 NOTE — Progress Notes (Signed)
 The test results show that your current treatment is OK, as the tests are stable.  Please continue the same plan.  There is no other need for change of treatment or further evaluation based on these results, at this time.  thanks

## 2023-11-29 ENCOUNTER — Encounter: Payer: Self-pay | Admitting: Internal Medicine

## 2023-11-29 DIAGNOSIS — H6120 Impacted cerumen, unspecified ear: Secondary | ICD-10-CM | POA: Insufficient documentation

## 2023-11-29 NOTE — Assessment & Plan Note (Signed)
 Lab Results  Component Value Date   CREATININE 0.82 11/28/2023   Stable overall, cont to avoid nephrotoxins

## 2023-11-29 NOTE — Assessment & Plan Note (Signed)
 Lab Results  Component Value Date   LDLCALC 75 11/28/2023   Uncontrolled,, pt to continue current statin crestor  40 mg and dclines add zetia 10 qd

## 2023-11-29 NOTE — Assessment & Plan Note (Signed)
 Lab Results  Component Value Date   HGBA1C 5.6 11/28/2023   Stable, pt to continue current medical treatment  - diet, wt control

## 2023-11-29 NOTE — Assessment & Plan Note (Addendum)

## 2023-11-29 NOTE — Assessment & Plan Note (Signed)
 Resolved and cleared with tx,  to f/u any worsening symptoms or concerns

## 2023-11-29 NOTE — Assessment & Plan Note (Signed)
 Last vitamin D  Lab Results  Component Value Date   VD25OH 23.31 (L) 11/28/2023   Low, to start oral replacement

## 2023-12-29 ENCOUNTER — Encounter (HOSPITAL_BASED_OUTPATIENT_CLINIC_OR_DEPARTMENT_OTHER): Payer: Self-pay

## 2023-12-29 ENCOUNTER — Other Ambulatory Visit: Payer: Self-pay

## 2023-12-29 ENCOUNTER — Emergency Department (HOSPITAL_BASED_OUTPATIENT_CLINIC_OR_DEPARTMENT_OTHER)
Admission: EM | Admit: 2023-12-29 | Discharge: 2023-12-29 | Disposition: A | Discharge status: Home / Self Care | Attending: Emergency Medicine | Admitting: Emergency Medicine

## 2023-12-29 ENCOUNTER — Emergency Department (HOSPITAL_BASED_OUTPATIENT_CLINIC_OR_DEPARTMENT_OTHER)

## 2023-12-29 DIAGNOSIS — I129 Hypertensive chronic kidney disease with stage 1 through stage 4 chronic kidney disease, or unspecified chronic kidney disease: Secondary | ICD-10-CM | POA: Diagnosis not present

## 2023-12-29 DIAGNOSIS — I10 Essential (primary) hypertension: Secondary | ICD-10-CM | POA: Diagnosis not present

## 2023-12-29 DIAGNOSIS — M19072 Primary osteoarthritis, left ankle and foot: Secondary | ICD-10-CM | POA: Diagnosis not present

## 2023-12-29 DIAGNOSIS — S82832A Other fracture of upper and lower end of left fibula, initial encounter for closed fracture: Secondary | ICD-10-CM

## 2023-12-29 DIAGNOSIS — M25472 Effusion, left ankle: Secondary | ICD-10-CM | POA: Diagnosis not present

## 2023-12-29 DIAGNOSIS — S82435A Nondisplaced oblique fracture of shaft of left fibula, initial encounter for closed fracture: Secondary | ICD-10-CM | POA: Diagnosis not present

## 2023-12-29 DIAGNOSIS — N189 Chronic kidney disease, unspecified: Secondary | ICD-10-CM | POA: Insufficient documentation

## 2023-12-29 DIAGNOSIS — S99912A Unspecified injury of left ankle, initial encounter: Secondary | ICD-10-CM | POA: Diagnosis present

## 2023-12-29 DIAGNOSIS — Z79899 Other long term (current) drug therapy: Secondary | ICD-10-CM | POA: Insufficient documentation

## 2023-12-29 DIAGNOSIS — I2510 Atherosclerotic heart disease of native coronary artery without angina pectoris: Secondary | ICD-10-CM | POA: Diagnosis not present

## 2023-12-29 DIAGNOSIS — W19XXXA Unspecified fall, initial encounter: Secondary | ICD-10-CM | POA: Diagnosis not present

## 2023-12-29 NOTE — ED Notes (Signed)
Pt. Has positive pedal pulse in the L foot.

## 2023-12-29 NOTE — ED Notes (Signed)
 EKG per Charge request

## 2023-12-29 NOTE — ED Triage Notes (Signed)
 Pt resides in a assisted living area at [place]. Pt reports that she was cleaning her room yesterday and fell down. Denies hitting head. States that she did hurt her left ankle. States that she has been using her walker.

## 2023-12-29 NOTE — ED Provider Notes (Signed)
 [facility] Provider Note   CSN: [id number] Arrival date & time: 12/29/23  4098     Patient presents with: Gloria [name] Gloria [name] is a 89 y.o. female.   Patient is a 89 year old female with a past medical history of mild cognitive decline, CAD, hypertension presenting to the emergency department after a fall.  Patient states that yesterday morning she fell when going to breakfast onto her left ankle.  She denies hitting her head or losing consciousness.  She lives at an assisted living and was assisted into a wheelchair and was brought back to her room.  She states that she has been able to walk with a walker since then.  She denies any other pain or injury.  She denies any chest pain, shortness of breath or lightheaded or or dizziness prior to the fall.  She is here with her family who reports that she has been getting Tylenol  for pain at this facility.  She denies any blood thinner use.  The history is provided by the patient and a relative.  Fall       Prior to Admission medications   Medication Sig Start Date End Date Taking? Authorizing Provider  acetaminophen  (TYLENOL ) 325 MG tablet Take 2 tablets (650 mg total) by mouth every 6 (six) hours as needed for mild pain (or Fever >/= 101). 04/16/22   Gonfa, Taye T, MD  feeding supplement (ENSURE ENLIVE / ENSURE PLUS) LIQD Take 237 mLs by mouth daily. 04/17/22   Gonfa, Taye T, MD  fiber supplement, BANATROL TF, liquid Take 60 mLs by mouth 2 (two) times daily. 04/16/22   Gonfa, Taye T, MD  furosemide  (LASIX ) 20 MG tablet Take 1 tablet (20 mg total) by mouth daily. 05/11/22   Sagardia, Miguel Jose, MD  metoprolol  succinate (TOPROL -XL) 25 MG 24 hr tablet Take 1 tablet (25 mg total) by mouth daily. 11/28/23   Roslyn Coombe, MD  nitrofurantoin  (MACRODANTIN ) 50 MG capsule Take 1 capsule (50 mg total) by mouth 4 (four) times daily. 07/26/22   Roslyn Coombe, MD  nystatin  cream (MYCOSTATIN ) Apply 1  Application topically 2 (two) times daily. 06/21/22   Roslyn Coombe, MD  polyethylene glycol (MIRALAX  / GLYCOLAX ) 17 g packet Take 17 g by mouth 2 (two) times daily as needed for mild constipation. 04/16/22   Gonfa, Taye T, MD  potassium chloride  (KLOR-CON  10) 10 MEQ tablet Take 1 tablet (10 mEq total) by mouth daily. 11/28/23   Roslyn Coombe, MD  rosuvastatin  (CRESTOR ) 40 MG tablet Take 1 tablet (40 mg total) by mouth daily. 11/28/23   Roslyn Coombe, MD  senna-docusate (SENOKOT-S) 8.6-50 MG tablet Take 1 tablet by mouth 2 (two) times daily as needed for moderate constipation. 04/16/22   Gonfa, Taye T, MD    Allergies: Patient has no known allergies.    Review of Systems  Updated Vital Signs BP (!) 143/63   Pulse 65   Temp 98.4 F (36.9 C)   Resp 18   Ht 5' 4 (1.626 m)   Wt 65.6 kg   SpO2 97%   BMI 24.82 kg/m   Physical Exam Vitals and nursing note reviewed.  Constitutional:      General: She is not in acute distress.    Appearance: Normal appearance.  HENT:     Head: Normocephalic and atraumatic.     Nose: Nose normal.     Mouth/Throat:     Mouth: Mucous  membranes are moist.   Eyes:     Extraocular Movements: Extraocular movements intact.     Conjunctiva/sclera: Conjunctivae normal.   Neck:     Comments: No midline neck tenderness Cardiovascular:     Rate and Rhythm: Normal rate and regular rhythm.     Pulses: Normal pulses.     Heart sounds: Murmur (3/5 systolic murmur) heard.  Pulmonary:     Effort: Pulmonary effort is normal.     Breath sounds: Normal breath sounds.  Abdominal:     General: Abdomen is flat.     Palpations: Abdomen is soft.     Tenderness: There is no abdominal tenderness.   Musculoskeletal:        General: Normal range of motion.     Cervical back: Normal range of motion and neck supple.     Comments: No midline back tenderness No bony tenderness to bilateral upper extremities or right lower extremity, tenderness to palpation of left ankle  with contusion and swelling to left lateral ankle, no bony tenderness to foot or knee Pelvis stable, nontender   Skin:    General: Skin is warm and dry.   Neurological:     General: No focal deficit present.     Mental Status: She is alert and oriented to person, place, and time.     Sensory: No sensory deficit.     Motor: No weakness.   Psychiatric:        Mood and Affect: Mood normal.        Behavior: Behavior normal.     (all labs ordered are listed, but only abnormal results are displayed) Labs Reviewed - No data to display  EKG: EKG Interpretation Date/Time:  Friday December 29 2023 10:20:16 EDT Ventricular Rate:  69 PR Interval:  183 QRS Duration:  84 QT Interval:  399 QTC Calculation: 428 R Axis:   178  Text Interpretation: Sinus rhythm Atrial premature complexes Probable lateral infarct, age indeterminate Now in sinus rhythm wtih resolusion of rate dependent ST depressions compared to prior EKG Confirmed by Celesta Coke ([id number]) on 12/29/2023 10:22:26 AM  Radiology: Lenell Query Ankle Left Port Result Date: 12/29/2023 CLINICAL DATA:  Status post fall with left lateral ankle pain, swelling, and bruising EXAM: PORTABLE LEFT ANKLE - 2 VIEW COMPARISON:  None Available. FINDINGS: Nondisplaced oblique fracture of the distal fibular diaphysis at the level of the syndesmosis. Small joint effusion. Degenerative changes of the ankle. Ankle mortise is grossly intact. Soft tissue swelling over the lateral malleolus. IMPRESSION: Nondisplaced oblique fracture of the distal fibular diaphysis at the level of the syndesmosis. Electronically Signed   By: Limin  Xu M.D.   On: 12/29/2023 12:58     Procedures   Medications Ordered in the ED - No data to display  Clinical Course as of 12/29/23 1311  Fri Dec 29, 2023  1301 R ankle XR with non-displaced distal fibula fracture. Will be placed in CAM boot. Does have walker to use at facility. Will be recommended outpatient ortho follow up. [VK]     Clinical Course User Index [VK] Kingsley, Elasha Tess K, DO                                 Medical Decision Making This patient presents to the ED with chief complaint(s) of ankle pain, fall with pertinent past medical history of CAD, HTN, CKD, cognitive decline which further complicates the presenting complaint. The complaint involves  an extensive differential diagnosis and also carries with it a high risk of complications and morbidity.    The differential diagnosis includes ankle fracture, dislocation, sprain, no evidence of neurovascular injury, no other signs of other trauma on exam, no presyncopal symptoms making a syncopal fall unlikely  Additional history obtained: Additional history obtained from family Records reviewed Nursing Home Documents  ED Course and Reassessment: On patient's arrival she is hemodynamically stable in no acute distress and is neurovascularly intact.  Does have swelling and bruising to her left lateral ankle and will have x-ray performed.  No other traumatic injury seen on exam.  She had ice pack placed, declined any additional pain control at this time will be closely reassessed.  Independent labs interpretation:  N/A  Independent visualization of imaging: - I independently visualized the following imaging with scope of interpretation limited to determining acute life threatening conditions related to emergency care: L ankle XR, which revealed distal fibula fracture  Consultation: - Consulted or discussed management/test interpretation w/ external professional: N/A  Consideration for admission or further workup: Patient has no emergent conditions requiring admission or further work-up at this time and is stable for discharge home with ortho follow-up  Social Determinants of health: N/A    Amount and/or Complexity of Data Reviewed Radiology: ordered.       Final diagnoses:  Fall, initial encounter  Closed fracture of distal end of left fibula,  unspecified fracture morphology, initial encounter    ED Discharge Orders     None          Nolberto Batty, DO 12/29/23 1311

## 2023-12-29 NOTE — Discharge Instructions (Signed)
 You were seen in the emergency department after your fall.  You did break your ankle however this is a stable fracture that you are able to put weight on.  We have given you a walking boot that you should wear when you are up and walking at all times and should continue to use the walker to assist with walking and your balance.  You can take the boot off to shower.  You can ice your ankle and keep it elevated to help with the swelling and can take Tylenol  every 6 hours as needed for pain.  You should follow-up in the orthopedic clinic the next 1 to 2 weeks to have your symptoms rechecked.  You should return to the emergency department if you have significantly worsening pain, your toes turn numb or blue or if you have any other new or concerning symptoms.

## 2024-01-23 DIAGNOSIS — S82832A Other fracture of upper and lower end of left fibula, initial encounter for closed fracture: Secondary | ICD-10-CM | POA: Diagnosis not present

## 2024-02-20 ENCOUNTER — Ambulatory Visit
Admission: RE | Admit: 2024-02-20 | Discharge: 2024-02-20 | Disposition: A | Discharge status: Home / Self Care | Source: Ambulatory Visit | Attending: Family Medicine | Admitting: Family Medicine

## 2024-02-20 ENCOUNTER — Other Ambulatory Visit: Payer: Self-pay | Admitting: Family Medicine

## 2024-02-20 DIAGNOSIS — S82832A Other fracture of upper and lower end of left fibula, initial encounter for closed fracture: Secondary | ICD-10-CM

## 2024-04-10 DIAGNOSIS — S82832A Other fracture of upper and lower end of left fibula, initial encounter for closed fracture: Secondary | ICD-10-CM | POA: Diagnosis not present

## 2024-04-30 ENCOUNTER — Other Ambulatory Visit (HOSPITAL_BASED_OUTPATIENT_CLINIC_OR_DEPARTMENT_OTHER): Payer: Self-pay

## 2024-04-30 MED ORDER — FLUZONE HIGH-DOSE 0.5 ML IM SUSY
0.5000 mL | PREFILLED_SYRINGE | Freq: Once | INTRAMUSCULAR | 0 refills | Status: AC
  Filled 2024-04-30: qty 0.5, 1d supply, fill #0

## 2024-06-03 ENCOUNTER — Ambulatory Visit: Admitting: Internal Medicine

## 2024-06-14 ENCOUNTER — Ambulatory Visit: Admitting: Internal Medicine

## 2024-09-04 ENCOUNTER — Ambulatory Visit: Admitting: Student

## 2024-11-22 ENCOUNTER — Ambulatory Visit
